# Patient Record
Sex: Male | Born: 1955 | Race: White | Hispanic: No | Smoking: Never smoker
Health system: Southern US, Community
[De-identification: ages and names within clinical notes are randomized; demographics above are authoritative.]

## PROBLEM LIST (undated history)

## (undated) DIAGNOSIS — R32 Unspecified urinary incontinence: Secondary | ICD-10-CM

## (undated) DIAGNOSIS — G4700 Insomnia, unspecified: Secondary | ICD-10-CM

## (undated) DIAGNOSIS — I340 Nonrheumatic mitral (valve) insufficiency: Secondary | ICD-10-CM

## (undated) DIAGNOSIS — M1990 Unspecified osteoarthritis, unspecified site: Secondary | ICD-10-CM

## (undated) DIAGNOSIS — F1020 Alcohol dependence, uncomplicated: Secondary | ICD-10-CM

## (undated) DIAGNOSIS — Z8639 Personal history of other endocrine, nutritional and metabolic disease: Secondary | ICD-10-CM

## (undated) DIAGNOSIS — K862 Cyst of pancreas: Secondary | ICD-10-CM

## (undated) DIAGNOSIS — T7840XA Allergy, unspecified, initial encounter: Secondary | ICD-10-CM

## (undated) DIAGNOSIS — N529 Male erectile dysfunction, unspecified: Secondary | ICD-10-CM

## (undated) DIAGNOSIS — L03119 Cellulitis of unspecified part of limb: Secondary | ICD-10-CM

## (undated) DIAGNOSIS — I272 Pulmonary hypertension, unspecified: Secondary | ICD-10-CM

## (undated) DIAGNOSIS — I10 Essential (primary) hypertension: Secondary | ICD-10-CM

## (undated) DIAGNOSIS — G2581 Restless legs syndrome: Secondary | ICD-10-CM

## (undated) DIAGNOSIS — D649 Anemia, unspecified: Secondary | ICD-10-CM

## (undated) DIAGNOSIS — L02419 Cutaneous abscess of limb, unspecified: Secondary | ICD-10-CM

## (undated) HISTORY — DX: Restless legs syndrome: G25.81

## (undated) HISTORY — DX: Alcohol dependence, uncomplicated: F10.20

## (undated) HISTORY — DX: Cutaneous abscess of limb, unspecified: L02.419

## (undated) HISTORY — DX: Essential (primary) hypertension: I10

## (undated) HISTORY — DX: Pulmonary hypertension, unspecified: I27.20

## (undated) HISTORY — DX: Cellulitis of unspecified part of limb: L03.119

## (undated) HISTORY — PX: HEMORRHOID SURGERY: SHX153

## (undated) HISTORY — DX: Anemia, unspecified: D64.9

## (undated) HISTORY — DX: Insomnia, unspecified: G47.00

## (undated) HISTORY — DX: Unspecified urinary incontinence: R32

## (undated) HISTORY — DX: Cyst of pancreas: K86.2

## (undated) HISTORY — DX: Personal history of other endocrine, nutritional and metabolic disease: Z86.39

## (undated) HISTORY — DX: Male erectile dysfunction, unspecified: N52.9

## (undated) HISTORY — DX: Unspecified osteoarthritis, unspecified site: M19.90

## (undated) HISTORY — DX: Nonrheumatic mitral (valve) insufficiency: I34.0

## (undated) HISTORY — DX: Allergy, unspecified, initial encounter: T78.40XA

---
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
## 2011-10-01 ENCOUNTER — Ambulatory Visit (INDEPENDENT_AMBULATORY_CARE_PROVIDER_SITE_OTHER): Payer: Managed Care, Other (non HMO) | Admitting: Family Medicine

## 2011-10-01 ENCOUNTER — Ambulatory Visit: Payer: Managed Care, Other (non HMO)

## 2011-10-01 VITALS — BP 177/89 | HR 93 | Temp 97.9°F | Resp 16 | Ht 76.18 in | Wt 219.0 lb

## 2011-10-01 DIAGNOSIS — M79609 Pain in unspecified limb: Secondary | ICD-10-CM

## 2011-10-01 DIAGNOSIS — M79605 Pain in left leg: Secondary | ICD-10-CM

## 2011-10-01 DIAGNOSIS — R600 Localized edema: Secondary | ICD-10-CM

## 2011-10-01 DIAGNOSIS — I10 Essential (primary) hypertension: Secondary | ICD-10-CM

## 2011-10-01 DIAGNOSIS — R609 Edema, unspecified: Secondary | ICD-10-CM

## 2011-10-01 LAB — BASIC METABOLIC PANEL
BUN: 13 mg/dL (ref 6–23)
Chloride: 106 mEq/L (ref 96–112)
Glucose, Bld: 107 mg/dL — ABNORMAL HIGH (ref 70–99)
Potassium: 4.2 mEq/L (ref 3.5–5.3)
Sodium: 139 mEq/L (ref 135–145)

## 2011-10-01 LAB — MAGNESIUM: Magnesium: 1.9 mg/dL (ref 1.5–2.5)

## 2011-10-01 LAB — BASIC METABOLIC PANEL WITH GFR
CO2: 22 meq/L (ref 19–32)
Calcium: 9.4 mg/dL (ref 8.4–10.5)
Creat: 0.78 mg/dL (ref 0.50–1.35)

## 2011-10-01 MED ORDER — HYDROCHLOROTHIAZIDE 12.5 MG PO TABS: 12.5000 mg | ORAL_TABLET | Freq: Every day | ORAL | Status: DC
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
## 2011-10-13 ENCOUNTER — Ambulatory Visit (INDEPENDENT_AMBULATORY_CARE_PROVIDER_SITE_OTHER): Payer: Managed Care, Other (non HMO) | Admitting: Family Medicine

## 2011-10-13 ENCOUNTER — Encounter: Payer: Self-pay | Admitting: Family Medicine

## 2011-10-13 VITALS — BP 162/104 | HR 103 | Temp 98.3°F | Resp 16 | Ht 76.0 in | Wt 214.0 lb

## 2011-10-13 DIAGNOSIS — M949 Disorder of cartilage, unspecified: Secondary | ICD-10-CM

## 2011-10-13 DIAGNOSIS — M898X9 Other specified disorders of bone, unspecified site: Secondary | ICD-10-CM

## 2011-10-13 DIAGNOSIS — M1710 Unilateral primary osteoarthritis, unspecified knee: Secondary | ICD-10-CM

## 2011-10-13 DIAGNOSIS — I10 Essential (primary) hypertension: Secondary | ICD-10-CM

## 2011-10-13 DIAGNOSIS — M899 Disorder of bone, unspecified: Secondary | ICD-10-CM

## 2011-10-13 DIAGNOSIS — M159 Polyosteoarthritis, unspecified: Secondary | ICD-10-CM

## 2011-10-13 LAB — HEPATIC FUNCTION PANEL
ALT: 71 U/L — ABNORMAL HIGH (ref 0–53)
AST: 98 U/L — ABNORMAL HIGH (ref 0–37)
Alkaline Phosphatase: 133 U/L — ABNORMAL HIGH (ref 39–117)
Bilirubin, Direct: 0.4 mg/dL — ABNORMAL HIGH (ref 0.0–0.3)
Total Bilirubin: 1.3 mg/dL — ABNORMAL HIGH (ref 0.3–1.2)

## 2011-10-13 LAB — CBC
HCT: 46.1 % (ref 39.0–52.0)
Hemoglobin: 15.1 g/dL (ref 13.0–17.0)
MCH: 32.5 pg (ref 26.0–34.0)
MCHC: 32.8 g/dL (ref 30.0–36.0)
RBC: 4.65 MIL/uL (ref 4.22–5.81)

## 2011-10-13 LAB — POCT SEDIMENTATION RATE: POCT SED RATE: 20 mm/hr (ref 0–22)

## 2011-10-13 MED ORDER — LISINOPRIL 20 MG PO TABS: ORAL_TABLET | ORAL | Status: DC

## 2011-10-13 MED ORDER — NABUMETONE 500 MG PO TABS: 500.0000 mg | ORAL_TABLET | Freq: Two times a day (BID) | ORAL | Status: AC
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
## 2011-10-13 NOTE — Patient Instructions (Signed)
Vitamin D Deficiency  Not having enough vitamin D is called a deficiency. Your body needs this vitamin to keep your bones strong and healthy. Having too little of it can make your bones soft or can cause other health problems.  HOME CARE  Take all vitamins, herbs, or nutrition drinks (supplements) as told by your doctor.   Have your blood tested 2 months after taking vitamins, herbs, or nutrition drinks.   Eat foods that have vitamin D. This includes:   Dairy products, cereals, or juices with added vitamin D. Check the label.   Fatty fish like salmon or trout.   Eggs.   Oysters.   Go outside for 10 to 15 minutes when the sun is shining. Do this 3 times a week. Do not do this if you have skin cancer.   Do not use tanning beds.   Stay at a healthy weight. Lose weight if needed.   Keep all doctor visits as told.  GET HELP IF:  You have questions.   You continue to have problems.   You feel sick to your stomach (nauseous) or throw up (vomit).   You cannot go poop (constipated).   You feel confused.   You have severe belly (abdominal) or back pain.  MAKE SURE YOU:  Understand these instructions.   Will watch your condition.   Will get help right away if you are not doing well or get worse.  Document Released: 07/13/2011 Document Reviewed: 07/11/2011 ExitCare Patient Information 2012 [employer]    .Hypertension As your heart beats, it forces blood through your arteries. This force is your blood pressure. If the pressure is too high, it is called hypertension (HTN) or high blood pressure. HTN is dangerous because you may have it and not know it. High blood pressure may mean that your heart has to work harder to pump blood. Your arteries may be narrow or stiff. The extra work puts you at risk for heart disease, stroke, and other problems.  Blood pressure consists of two numbers, a higher number over a lower, 110/72, for example. It is stated as "110 over 72." The  ideal is below 120 for the top number (systolic) and under 80 for the bottom (diastolic). Write down your blood pressure today. You should pay close attention to your blood pressure if you have certain conditions such as:  Heart failure.   Prior heart attack.   Diabetes   Chronic kidney disease.   Prior stroke.   Multiple risk factors for heart disease.  To see if you have HTN, your blood pressure should be measured while you are seated with your arm held at the level of the heart. It should be measured at least twice. A one-time elevated blood pressure reading (especially in the Emergency Department) does not mean that you need treatment. There may be conditions in which the blood pressure is different between your right and left arms. It is important to see your caregiver soon for a recheck. Most people have essential hypertension which means that there is not a specific cause. This type of high blood pressure may be lowered by changing lifestyle factors such as:  Stress.   Smoking.   Lack of exercise.   Excessive weight.   Drug/tobacco/alcohol use.   Eating less salt.  Most people do not have symptoms from high blood pressure until it has caused damage to the body. Effective treatment can often prevent, delay or reduce that damage. TREATMENT  When a cause has   been identified, treatment for high blood pressure is directed at the cause. There are a large number of medications to treat HTN. These fall into several categories, and your caregiver will help you select the medicines that are best for you. Medications may have side effects. You should review side effects with your caregiver. If your blood pressure stays high after you have made lifestyle changes or started on medicines,   Your medication(s) may need to be changed.   Other problems may need to be addressed.   Be certain you understand your prescriptions, and know how and when to take your medicine.   Be sure to  follow up with your caregiver within the time frame advised (usually within two weeks) to have your blood pressure rechecked and to review your medications.   If you are taking more than one medicine to lower your blood pressure, make sure you know how and at what times they should be taken. Taking two medicines at the same time can result in blood pressure that is too low.  SEEK IMMEDIATE MEDICAL CARE IF:  You develop a severe headache, blurred or changing vision, or confusion.   You have unusual weakness or numbness, or a faint feeling.   You have severe chest or abdominal pain, vomiting, or breathing problems.  MAKE SURE YOU:   Understand these instructions.   Will watch your condition.   Will get help right away if you are not doing well or get worse.  Document Released: 07/24/2005 Document Revised: 07/13/2011 Document Reviewed: 03/13/2008 ExitCare Patient Information 2012 [employer]. 

## 2011-10-14 LAB — VITAMIN D 25 HYDROXY (VIT D DEFICIENCY, FRACTURES): Vit D, 25-Hydroxy: 17 ng/mL — ABNORMAL LOW (ref 30–89)

## 2011-10-15 ENCOUNTER — Encounter: Payer: Self-pay | Admitting: Family Medicine

## 2011-10-15 DIAGNOSIS — I10 Essential (primary) hypertension: Secondary | ICD-10-CM | POA: Insufficient documentation

## 2011-10-15 DIAGNOSIS — M1710 Unilateral primary osteoarthritis, unspecified knee: Secondary | ICD-10-CM | POA: Insufficient documentation

## 2011-10-16 MED ORDER — ERGOCALCIFEROL 1.25 MG (50000 UT) PO CAPS: 50000.0000 [IU] | ORAL_CAPSULE | ORAL | Status: AC

## 2011-10-16 NOTE — Progress Notes (Signed)
Review of labs indicate a moderately severe Vit D deficiency; I routed to pt's pharmacy: Drisdol 50,000 IU  1 capsule once a week for 12 weeks. He should schedule a follow-up with me in about 10 weeks.

## 2011-10-16 NOTE — Progress Notes (Signed)
Quick Note:  Please call pt and advise that the following labs are abnormal... He has a moderately severe Vit D deficiency which could explain a lot of his musculoskeletal symptoms. I have prescribed Drisdol ( Vitamin D) 50,000 IU 4 capsules Take 1 capsule once a week for next 12 weeks (there are RFs on this medication). This med has been routed to his pharmacy.  Other abnl Labs: Elevated liver tests and platelets are a little low ( this is not of huge concern).  I would like for him to schedule a follow-up with me in ~ 10 weeks.    ______

## 2011-10-16 NOTE — Progress Notes (Signed)
Addended by: Dow Adolph B on: 10/16/2011 07:05 PM   Modules accepted: Orders

## 2011-10-23 ENCOUNTER — Encounter: Payer: Self-pay | Admitting: *Deleted

## 2011-10-27 ENCOUNTER — Ambulatory Visit: Payer: Managed Care, Other (non HMO) | Admitting: Family Medicine

## 2011-11-10 ENCOUNTER — Ambulatory Visit: Payer: Managed Care, Other (non HMO) | Admitting: Family Medicine

## 2011-11-24 ENCOUNTER — Ambulatory Visit: Payer: Managed Care, Other (non HMO) | Admitting: Family Medicine

## 2011-11-30 ENCOUNTER — Telehealth: Payer: Self-pay

## 2011-11-30 ENCOUNTER — Ambulatory Visit: Payer: Managed Care, Other (non HMO) | Admitting: Family Medicine

## 2011-11-30 DIAGNOSIS — R600 Localized edema: Secondary | ICD-10-CM

## 2011-11-30 DIAGNOSIS — I10 Essential (primary) hypertension: Secondary | ICD-10-CM

## 2011-11-30 MED ORDER — HYDROCHLOROTHIAZIDE 12.5 MG PO TABS: 12.5000 mg | ORAL_TABLET | Freq: Every day | ORAL | Status: DC

## 2011-11-30 MED ORDER — LISINOPRIL 20 MG PO TABS: ORAL_TABLET | ORAL | Status: DC

## 2011-11-30 NOTE — Telephone Encounter (Signed)
Done   Ronald Zhang

## 2011-11-30 NOTE — Telephone Encounter (Signed)
CAN WE REFILL PT'S BP MED SINCE HIS APPT WAS CANCELLED TODAY?

## 2011-11-30 NOTE — Telephone Encounter (Signed)
Called patient. Left a message about the script being called in.

## 2011-11-30 NOTE — Telephone Encounter (Signed)
Pt had appt w/dr mcpherson today (Dr.McPherson cancelled due to illness) - pt is rescheduled for tomorrow, but is totally out of his bp rx - HCTZ 12.5 Needs refill through the weekend - including today especially   Uses [facility] -- [address]  bf

## 2011-12-01 ENCOUNTER — Telehealth: Payer: Self-pay | Admitting: Internal Medicine

## 2011-12-01 ENCOUNTER — Ambulatory Visit: Payer: Managed Care, Other (non HMO) | Admitting: Family Medicine

## 2011-12-01 DIAGNOSIS — R600 Localized edema: Secondary | ICD-10-CM

## 2011-12-01 DIAGNOSIS — I10 Essential (primary) hypertension: Secondary | ICD-10-CM

## 2011-12-01 MED ORDER — HYDROCHLOROTHIAZIDE 12.5 MG PO TABS: 12.5000 mg | ORAL_TABLET | Freq: Every day | ORAL | Status: DC

## 2011-12-01 NOTE — Telephone Encounter (Signed)
HCTZ refilled for one year

## 2011-12-22 ENCOUNTER — Ambulatory Visit: Payer: Managed Care, Other (non HMO) | Admitting: Family Medicine

## 2012-03-20 ENCOUNTER — Encounter: Payer: Self-pay | Admitting: Family Medicine

## 2012-03-22 ENCOUNTER — Telehealth: Payer: Self-pay | Admitting: Radiology

## 2012-03-22 DIAGNOSIS — I10 Essential (primary) hypertension: Secondary | ICD-10-CM

## 2012-03-22 MED ORDER — LISINOPRIL 20 MG PO TABS: ORAL_TABLET | ORAL | Status: DC

## 2012-03-22 NOTE — Telephone Encounter (Signed)
Refilled Lisinopril. 

## 2012-08-07 DIAGNOSIS — Z8639 Personal history of other endocrine, nutritional and metabolic disease: Secondary | ICD-10-CM

## 2012-08-07 HISTORY — DX: Personal history of other endocrine, nutritional and metabolic disease: Z86.39

## 2012-09-08 ENCOUNTER — Ambulatory Visit (INDEPENDENT_AMBULATORY_CARE_PROVIDER_SITE_OTHER): Payer: Managed Care, Other (non HMO) | Admitting: Family Medicine

## 2012-09-08 VITALS — BP 156/80 | HR 111 | Temp 98.6°F | Resp 16 | Ht 75.0 in | Wt 225.0 lb

## 2012-09-08 DIAGNOSIS — L309 Dermatitis, unspecified: Secondary | ICD-10-CM

## 2012-09-08 DIAGNOSIS — B86 Scabies: Secondary | ICD-10-CM

## 2012-09-08 DIAGNOSIS — F1010 Alcohol abuse, uncomplicated: Secondary | ICD-10-CM

## 2012-09-08 DIAGNOSIS — I10 Essential (primary) hypertension: Secondary | ICD-10-CM

## 2012-09-08 DIAGNOSIS — R600 Localized edema: Secondary | ICD-10-CM

## 2012-09-08 DIAGNOSIS — L259 Unspecified contact dermatitis, unspecified cause: Secondary | ICD-10-CM

## 2012-09-08 DIAGNOSIS — D649 Anemia, unspecified: Secondary | ICD-10-CM

## 2012-09-08 DIAGNOSIS — R609 Edema, unspecified: Secondary | ICD-10-CM

## 2012-09-08 LAB — COMPREHENSIVE METABOLIC PANEL
ALT: 37 U/L (ref 0–53)
Albumin: 4.2 g/dL (ref 3.5–5.2)
CO2: 25 mEq/L (ref 19–32)
Chloride: 101 mEq/L (ref 96–112)
Glucose, Bld: 96 mg/dL (ref 70–99)
Potassium: 3.9 mEq/L (ref 3.5–5.3)
Sodium: 135 mEq/L (ref 135–145)
Total Protein: 8 g/dL (ref 6.0–8.3)

## 2012-09-08 LAB — POCT CBC
HCT, POC: 36.1 % — AB (ref 43.5–53.7)
Hemoglobin: 10.8 g/dL — AB (ref 14.1–18.1)
Lymph, poc: 1.5 (ref 0.6–3.4)
MCH, POC: 26.9 pg — AB (ref 27–31.2)
MCHC: 29.9 g/dL — AB (ref 31.8–35.4)
RBC: 4.01 M/uL — AB (ref 4.69–6.13)
WBC: 7 10*3/uL (ref 4.6–10.2)

## 2012-09-08 MED ORDER — LISINOPRIL-HYDROCHLOROTHIAZIDE 20-25 MG PO TABS: ORAL_TABLET | ORAL | Status: DC

## 2012-09-08 MED ORDER — IVERMECTIN 3 MG PO TABS: 3.0000 mg | ORAL_TABLET | Freq: Once | ORAL | Status: DC

## 2012-09-08 MED ORDER — TRIAMCINOLONE ACETONIDE 0.1 % EX LOTN: TOPICAL_LOTION | CUTANEOUS | Status: DC

## 2012-09-08 NOTE — Progress Notes (Signed)
Subjective: Patient is here for several things. He's been having swelling in his legs, left greater than right, for one and one half years. It's just been a rather subtle onset and persistent problem. Last year he had an ultrasound which negative at [facility]. He is a has been treated for his blood pressure and given a hydrochlorothiazide diuretic with that, but the swelling has continued to persist.  He also has a history of high blood pressure and takes his medication regularly  He has a rash that has been on his trunk and arms for the past couple of months. It has mild itching to it.  He has a little sore place on his nose. He  Objective: Follicular looking placed on the bridge of his nose. He has elevated blood pressure as documented. Chest clear. Heart regular without murmurs. Legs have plus edema in the left, 2+ in the right. Pulse is adequate. No tenderness over the calf veins. Negative Homans sign. He has a papular rash on his trunk and upper arms to just below the elbows. Some areas are excoriated. Outside of this region as well as scattered areas. The waist band does not seem to be particularly involved, though it itches under his underwear line along the abdomen.  Assessment: Dermatitis, possibly scabietic, unclear etiology Bilateral edema, left worse than right, chronic Folliculitis on nose Hypertension poorly controlled  Plan check CBC and C. met and TSH Treat with ivermectin 18 mg(0.2 mg per kilogram) single dose Increase the blood pressure medicines are lisinopril HCT 20/25 for the blood pressure control and increased affect  If  rash persists we will have to do a punch biopsy.  Results for orders placed in visit on 09/08/12  POCT CBC      Component Value Range   WBC 7.0  4.6 - 10.2 K/uL   Lymph, poc 1.5  0.6 - 3.4   POC LYMPH PERCENT 21.2  10 - 50 %L   MID (cbc) 0.7  0 - 0.9   POC MID % 10.0  0 - 12 %M   POC Granulocyte 4.8  2 - 6.9   Granulocyte percent 68.8   37 - 80 %G   RBC 4.01 (*) 4.69 - 6.13 M/uL   Hemoglobin 10.8 (*) 14.1 - 18.1 g/dL   HCT, POC 16.1 (*) 09.6 - 53.7 %   MCV 90.0  80 - 97 fL   MCH, POC 26.9 (*) 27 - 31.2 pg   MCHC 29.9 (*) 31.8 - 35.4 g/dL   RDW, POC 04.5     Platelet Count, POC 161  142 - 424 K/uL   MPV 9.5  0 - 99.8 fL   Needs colonoscopy

## 2012-09-08 NOTE — Patient Instructions (Addendum)
Take the medication for scabies and use but lotion on the rash.  Contact a gastroenterologist to try and schedule a colonoscopy. If you need a referral from [place] please contact us.  I will let you know the results of your labs when they come back. You are anemic, and we will have to decide what treatment would be best for that.  Start working on cutting back on your alcohol intake. I suspect that is harming you.

## 2012-09-09 ENCOUNTER — Encounter: Payer: Self-pay | Admitting: Family Medicine

## 2012-09-09 LAB — TSH: TSH: 2.139 u[IU]/mL (ref 0.350–4.500)

## 2012-09-09 LAB — FOLATE: Folate: 7 ng/mL

## 2012-09-09 LAB — VITAMIN B12: Vitamin B-12: 540 pg/mL (ref 211–911)

## 2012-09-22 ENCOUNTER — Ambulatory Visit (INDEPENDENT_AMBULATORY_CARE_PROVIDER_SITE_OTHER): Payer: Managed Care, Other (non HMO) | Admitting: Family Medicine

## 2012-09-22 VITALS — BP 168/87 | HR 101 | Temp 98.4°F | Resp 20 | Ht 76.0 in | Wt 222.0 lb

## 2012-09-22 DIAGNOSIS — R600 Localized edema: Secondary | ICD-10-CM

## 2012-09-22 DIAGNOSIS — R609 Edema, unspecified: Secondary | ICD-10-CM

## 2012-09-22 DIAGNOSIS — I10 Essential (primary) hypertension: Secondary | ICD-10-CM

## 2012-09-22 DIAGNOSIS — D649 Anemia, unspecified: Secondary | ICD-10-CM

## 2012-09-22 DIAGNOSIS — B86 Scabies: Secondary | ICD-10-CM

## 2012-09-22 MED ORDER — FUROSEMIDE 20 MG PO TABS: ORAL_TABLET | ORAL | Status: DC

## 2012-09-22 MED ORDER — PERMETHRIN 5 % EX CREA: TOPICAL_CREAM | Freq: Once | CUTANEOUS | Status: DC

## 2012-09-22 NOTE — Progress Notes (Signed)
[facility]:  Office Visit  Chief Complaint:  Chief Complaint  Patient presents with  . Follow-up    rash-scabies  . blood pressure    bilateral leg swelling     HPI: Ronald Zhang is a 57 y.o. male who complains of : 1. HTN-new meds, ACEI added but BP still high. Patient drinks 1 pint [place] daily. Still has edema with HCTZ, improved initially but last 2 months worse especially when he has been at work. He states he has not changed anything. US Doppler results from [place] have been requested to be sent to [place].  2. Rash-improved but not completely resolved s/p Ivermectin. He also was using Triamcinolone. 3. Went over labs with patient. LFTs abnormal due to etoh use, T. Bili elevated. He has anemia, would like to go to get colonscopy in July. No stool changes, no hematuria/melena.   Past Medical History  Diagnosis Date  . Hypertension   . Allergy    Past Surgical History  Procedure Laterality Date  . Fracture surgery     History   Social History  . Marital Status: Single    Spouse Name: N/A    Number of Children: N/A  . Years of Education: N/A   Social History Main Topics  . Smoking status: Never Smoker   . Smokeless tobacco: None  . Alcohol Use: Yes  . Drug Use: No  . Sexually Active: None   Other Topics Concern  . None   Social History Narrative  . None   Family History  Problem Relation Age of Onset  . Hypertension Mother   . Hypertension Father    Allergies  Allergen Reactions  . Penicillins Swelling    Childhood   Prior to Admission medications   Medication Sig Start Date End Date Taking? Authorizing Provider  aspirin 81 MG tablet Take 81 mg by mouth daily.   Yes Historical Provider, MD  lisinopril-hydrochlorothiazide (PRINZIDE,ZESTORETIC) 20-25 MG per tablet Take 6 pills single dose for scabies 09/08/12  Yes Peyton Najjar, MD  triamcinolone lotion (KENALOG) 0.1 % Apply twice daily to rash as needed for itching 09/08/12  Yes Peyton Najjar, MD  ergocalciferol (DRISDOL) 50000 UNITS capsule Take 1 capsule (50,000 Units total) by mouth once a week. 10/16/11 10/15/12  Maurice March, MD  ivermectin (STROMECTOL) 3 MG TABS Take 1 tablet (3 mg total) by mouth once. 09/08/12   Peyton Najjar, MD  nabumetone (RELAFEN) 500 MG tablet Take 1 tablet (500 mg total) by mouth 2 (two) times daily. 10/13/11 10/12/12  Maurice March, MD     ROS: The patient denies current  fevers, chills, night sweats, unintentional weight loss, chest pain, palpitations, wheezing, dyspnea on exertion, nausea, vomiting, abdominal pain, dysuria, hematuria, melena, numbness, weakness, or tingling.   All other systems have been reviewed and were otherwise negative with the exception of those mentioned in the HPI and as above.    PHYSICAL EXAM: Filed Vitals:   09/22/12 0932  BP: 168/87  Pulse: 101  Temp: 98.4 F (36.9 C)  Resp: 20   Filed Vitals:   09/22/12 0932  Height: 6\' 4"  (1.93 m)  Weight: 222 lb (100.699 kg)   Body mass index is 27.03 kg/(m^2).  Zhang: Alert, no acute distress HEENT:  Normocephalic, atraumatic, oropharynx patent. EOMI, slight yellow tinge to skin, no scleral icterus.  Cardiovascular:  Regular rate and rhythm, no rubs murmurs or gallops.  No Carotid bruits, radial pulse intact. No pedal  edema.  Respiratory: Clear to auscultation bilaterally.  No wheezes, rales, or rhonchi.  No cyanosis, no use of accessory musculature GI: No organomegaly, abdomen is soft and non-tender, positive bowel sounds.  No masses. Skin: +rashes. + excoriated rash and also red erythematous pruritic lesions on back and arms Neurologic: Facial musculature symmetric. Psychiatric: Patient is appropriate throughout our interaction. Lymphatic: No cervical lymphadenopathy Musculoskeletal: Gait is antalgic, severe valgus   LABS: Results for orders placed in visit on 09/08/12  COMPREHENSIVE METABOLIC PANEL      Result Value Range   Sodium 135  135 - 145  mEq/L   Potassium 3.9  3.5 - 5.3 mEq/L   Chloride 101  96 - 112 mEq/L   CO2 25  19 - 32 mEq/L   Glucose, Bld 96  70 - 99 mg/dL   BUN 17  6 - 23 mg/dL   Creat 1.61  0.96 - 0.45 mg/dL   Total Bilirubin 1.8 (*) 0.3 - 1.2 mg/dL   Alkaline Phosphatase 120 (*) 39 - 117 U/L   AST 88 (*) 0 - 37 U/L   ALT 37  0 - 53 U/L   Total Protein 8.0  6.0 - 8.3 g/dL   Albumin 4.2  3.5 - 5.2 g/dL   Calcium 9.3  8.4 - 40.9 mg/dL  TSH      Result Value Range   TSH 2.139  0.350 - 4.500 uIU/mL  FERRITIN      Result Value Range   Ferritin 37  22 - 322 ng/mL  VITAMIN B12      Result Value Range   Vitamin B-12 540  211 - 911 pg/mL  FOLATE      Result Value Range   Folate 7.0    POCT CBC      Result Value Range   WBC 7.0  4.6 - 10.2 K/uL   Lymph, poc 1.5  0.6 - 3.4   POC LYMPH PERCENT 21.2  10 - 50 %L   MID (cbc) 0.7  0 - 0.9   POC MID % 10.0  0 - 12 %M   POC Granulocyte 4.8  2 - 6.9   Granulocyte percent 68.8  37 - 80 %G   RBC 4.01 (*) 4.69 - 6.13 M/uL   Hemoglobin 10.8 (*) 14.1 - 18.1 g/dL   HCT, POC 81.1 (*) 91.4 - 53.7 %   MCV 90.0  80 - 97 fL   MCH, POC 26.9 (*) 27 - 31.2 pg   MCHC 29.9 (*) 31.8 - 35.4 g/dL   RDW, POC 78.2     Platelet Count, POC 161  142 - 424 K/uL   MPV 9.5  0 - 99.8 fL     EKG/XRAY:   Primary read interpreted by Dr. Conley Rolls at [facility].   ASSESSMENT/PLAN: Encounter Diagnoses  Name Primary?  . HTN (hypertension) Yes  . Bilateral leg edema   . Scabies   . Anemia    D/w patient labs done on 09/08/12.  Return in 2 weeks with BP logs, may need to increase Lisinopril to 40 mg. He  has an alcohol dependency/abuse issue which iscontributing to his HTN. I am not sure increasing his ACEI to 40 mg is going to help. We discussed alcohol and HTN. Patient is not willing/ready to decrease alcohol consumption at this time.  Will add Lasix 10 mg prn for edema, if not better then consider taking 20 mg prn, patient will call me. Hypokalemia risk d/w patient. IF his edema continues  then I  may consider doing an [place] of his legs for venous insufficiency vs possible echocardiogram Rx Permetherin, advise that  if no improvement with rash may use Permetherin. C/w Triamcinolone. Will get punch bx on next visit if no improvement. Advise pt that Permetherin can cause itching after use.  He declines rectal exam today for anemia, will do it in 2 weeks. Would like referral to Dr. Braulio Conte in [place] for colonscopy which he will get in the summer. Has a h/o polyps on colonoscopy 5 years ago. Next visit will get labs: CMP for elevated LFTS, T. Bili ( if elevated may contribute to rash) F/u 2 weeks.    Riki Berninger PHUONG, DO 09/22/2012 11:10 AM

## 2012-09-22 NOTE — Patient Instructions (Signed)

## 2012-09-27 ENCOUNTER — Telehealth: Payer: Self-pay

## 2012-09-27 NOTE — Telephone Encounter (Signed)
PT DOESN'T WANT A CALL BACK BECAUSE HIS RINGER WILL BE OFF, HE WAS TO CHECK IN AND LET DR LE KNOW HOW HE IS DURING AND THE BP MEDS IS WORKING PRETTY GOOD. HIS PRESSURE IS 130/70. BUT HIS LEGS ARE SWELLING AND THEY AREN'T ANY BETTER, BUT HE WILL BE BACK TO SEE HER IN A WEEK.   DIDN'T WANT A CALL BACK JUST FYI

## 2012-10-05 ENCOUNTER — Other Ambulatory Visit: Payer: Self-pay | Admitting: Family Medicine

## 2012-10-05 ENCOUNTER — Ambulatory Visit: Payer: Managed Care, Other (non HMO)

## 2012-10-05 ENCOUNTER — Ambulatory Visit: Payer: Managed Care, Other (non HMO) | Admitting: Family Medicine

## 2012-10-05 VITALS — BP 170/80 | HR 100 | Temp 98.6°F | Resp 18 | Ht 75.0 in | Wt 226.0 lb

## 2012-10-05 DIAGNOSIS — L738 Other specified follicular disorders: Secondary | ICD-10-CM

## 2012-10-05 DIAGNOSIS — R748 Abnormal levels of other serum enzymes: Secondary | ICD-10-CM

## 2012-10-05 DIAGNOSIS — R079 Chest pain, unspecified: Secondary | ICD-10-CM

## 2012-10-05 DIAGNOSIS — E785 Hyperlipidemia, unspecified: Secondary | ICD-10-CM

## 2012-10-05 DIAGNOSIS — R600 Localized edema: Secondary | ICD-10-CM

## 2012-10-05 DIAGNOSIS — L739 Follicular disorder, unspecified: Secondary | ICD-10-CM

## 2012-10-05 DIAGNOSIS — R609 Edema, unspecified: Secondary | ICD-10-CM

## 2012-10-05 DIAGNOSIS — D649 Anemia, unspecified: Secondary | ICD-10-CM

## 2012-10-05 DIAGNOSIS — L678 Other hair color and hair shaft abnormalities: Secondary | ICD-10-CM

## 2012-10-05 DIAGNOSIS — R21 Rash and other nonspecific skin eruption: Secondary | ICD-10-CM

## 2012-10-05 LAB — COMPREHENSIVE METABOLIC PANEL WITH GFR
AST: 107 U/L — ABNORMAL HIGH (ref 0–37)
Alkaline Phosphatase: 114 U/L (ref 39–117)
Glucose, Bld: 103 mg/dL — ABNORMAL HIGH (ref 70–99)
Sodium: 140 meq/L (ref 135–145)
Total Bilirubin: 1.7 mg/dL — ABNORMAL HIGH (ref 0.3–1.2)
Total Protein: 7.8 g/dL (ref 6.0–8.3)

## 2012-10-05 LAB — COMPREHENSIVE METABOLIC PANEL
ALT: 41 U/L (ref 0–53)
Albumin: 3.9 g/dL (ref 3.5–5.2)
BUN: 17 mg/dL (ref 6–23)
CO2: 23 mEq/L (ref 19–32)
Calcium: 9.2 mg/dL (ref 8.4–10.5)
Chloride: 105 mEq/L (ref 96–112)
Creat: 0.88 mg/dL (ref 0.50–1.35)
Potassium: 4 mEq/L (ref 3.5–5.3)

## 2012-10-05 LAB — POCT CBC
Granulocyte percent: 68.3 %G (ref 37–80)
HCT, POC: 34 % — AB (ref 43.5–53.7)
Hemoglobin: 10.4 g/dL — AB (ref 14.1–18.1)
Lymph, poc: 1.4 (ref 0.6–3.4)
MCH, POC: 26.3 pg — AB (ref 27–31.2)
MCHC: 30.6 g/dL — AB (ref 31.8–35.4)
MCV: 86.1 fL (ref 80–97)
MID (cbc): 0.6 (ref 0–0.9)
MPV: 8.9 fL (ref 0–99.8)
POC Granulocyte: 4.3 (ref 2–6.9)
POC LYMPH PERCENT: 22.5 %L (ref 10–50)
POC MID %: 9.2 % (ref 0–12)
Platelet Count, POC: 147 10*3/uL (ref 142–424)
RBC: 3.95 M/uL — AB (ref 4.69–6.13)
RDW, POC: 16.9 %
WBC: 6.3 10*3/uL (ref 4.6–10.2)

## 2012-10-05 LAB — POCT URINALYSIS DIPSTICK
Bilirubin, UA: NEGATIVE
Blood, UA: NEGATIVE
Glucose, UA: NEGATIVE
Leukocytes, UA: NEGATIVE
Nitrite, UA: NEGATIVE
Spec Grav, UA: 1.02
Urobilinogen, UA: >=8
pH, UA: 7.5

## 2012-10-05 LAB — LIPID PANEL
Cholesterol: 189 mg/dL (ref 0–200)
HDL: 31 mg/dL — ABNORMAL LOW (ref >39)
LDL Cholesterol: 139 mg/dL — ABNORMAL HIGH (ref 0–99)
Total CHOL/HDL Ratio: 6.1 ratio
Triglycerides: 94 mg/dL (ref <150)
VLDL: 19 mg/dL (ref 0–40)

## 2012-10-05 LAB — POCT UA - MICROSCOPIC ONLY
Bacteria, U Microscopic: NEGATIVE
Casts, Ur, LPF, POC: NEGATIVE
Crystals, Ur, HPF, POC: NEGATIVE
Mucus, UA: NEGATIVE
RBC, urine, microscopic: NEGATIVE
Yeast, UA: NEGATIVE

## 2012-10-05 LAB — IFOBT (OCCULT BLOOD): IFOBT: NEGATIVE

## 2012-10-05 LAB — POCT GLYCOSYLATED HEMOGLOBIN (HGB A1C): Hemoglobin A1C: 5.3

## 2012-10-05 MED ORDER — CLOBETASOL PROPIONATE 0.05 % EX CREA: TOPICAL_CREAM | Freq: Two times a day (BID) | CUTANEOUS | Status: DC

## 2012-10-05 MED ORDER — MUPIROCIN 2 % EX OINT: TOPICAL_OINTMENT | Freq: Three times a day (TID) | CUTANEOUS | Status: DC

## 2012-10-05 MED ORDER — HYDROXYZINE HCL 10 MG PO TABS: 10.0000 mg | ORAL_TABLET | Freq: Every day | ORAL | Status: DC | PRN

## 2012-10-05 NOTE — Patient Instructions (Signed)

## 2012-10-05 NOTE — Progress Notes (Signed)
[facility]:  Office Visit  Chief Complaint:  Chief Complaint  Patient presents with  . Edema    in both legs, recheck   . Hypertension    started medication, recheck BP  . Rash    improving with medication, recheck     HPI: Ronald Zhang is a 57 y.o. male who complains of : 1. HTN- has been good, 130/70s at home. Denies dizziness. He has had CP migrating from different areas, left to right, last from 2-5 seconds, happens with and without exertion, sharp pain, denies HA, vision changes, diaphoreisis, numbness tingling, jaw or shoulde rpain, or n/v/abd pain. This has been going for 1 week. ? XOL, ? Diabetes 2. Pedal edema no improvement-took Lasix 10 mg prior to going to work. Worse today than it has ever been 3. Rash is better, less scratching but not completely resolved 5. Anemia-has colonoscopy scheduled with Dr. Letta Moynahan. He is ok to get a rectal exam today.     Past Medical History  Diagnosis Date  . Hypertension   . Allergy    Past Surgical History  Procedure Laterality Date  . Fracture surgery     History   Social History  . Marital Status: Single    Spouse Name: N/A    Number of Children: N/A  . Years of Education: N/A   Social History Main Topics  . Smoking status: Never Smoker   . Smokeless tobacco: None  . Alcohol Use: Yes  . Drug Use: No  . Sexually Active: None   Other Topics Concern  . None   Social History Narrative  . None   Family History  Problem Relation Age of Onset  . Hypertension Mother   . Hypertension Father    Allergies  Allergen Reactions  . Penicillins Swelling    Childhood   Prior to Admission medications   Medication Sig Start Date End Date Taking? Authorizing Provider  aspirin 81 MG tablet Take 81 mg by mouth daily.   Yes Historical Provider, MD  furosemide (LASIX) 20 MG tablet Take 1/2 tab PO prn for swelling. Eat a banana with this. 09/22/12  Yes Tniya Bowditch P Anijah Spohr, DO  lisinopril-hydrochlorothiazide  (PRINZIDE,ZESTORETIC) 20-25 MG per tablet Take 6 pills single dose for scabies 09/08/12  Yes Peyton Najjar, MD  ergocalciferol (DRISDOL) 50000 UNITS capsule Take 1 capsule (50,000 Units total) by mouth once a week. 10/16/11 10/15/12  Maurice March, MD  ivermectin (STROMECTOL) 3 MG TABS Take 1 tablet (3 mg total) by mouth once. 09/08/12   Peyton Najjar, MD  nabumetone (RELAFEN) 500 MG tablet Take 1 tablet (500 mg total) by mouth 2 (two) times daily. 10/13/11 10/12/12  Maurice March, MD  permethrin (ELIMITE) 5 % cream Apply topically once. 09/22/12   Chevette Fee P Sherae Santino, DO  triamcinolone lotion (KENALOG) 0.1 % Apply twice daily to rash as needed for itching 09/08/12   Peyton Najjar, MD     ROS: The patient denies fevers, chills, night sweats, unintentional weight loss, palpitations, wheezing, dyspnea on exertion, nausea, vomiting, abdominal pain, dysuria, hematuria, melena, numbness, weakness, or tingling.   All other systems have been reviewed and were otherwise negative with the exception of those mentioned in the HPI and as above.    PHYSICAL EXAM: Filed Vitals:   10/05/12 0903  BP: 170/80  Pulse: 100  Temp: 98.6 F (37 C)  Resp: 18   Filed Vitals:   10/05/12 0903  Height: 6\' 3"  (1.905 m)  Weight: 226 lb (102.513 kg)   Body mass index is 28.25 kg/(m^2).  General: Alert, no acute distress HEENT:  Normocephalic, atraumatic, oropharynx patent. EOMI, PERRLA, fundoscopic exam nl Cardiovascular:  Regular rate and rhythm, no rubs murmurs or gallops.  No Carotid bruits, radial pulse intact. +1-2 pedal edema BL.  Respiratory: Clear to auscultation bilaterally.  No wheezes, rales, or rhonchi.  No cyanosis, no use of accessory musculature GI: No organomegaly, abdomen is soft and non-tender, positive bowel sounds.  No masses. Skin: No rashes. Neurologic: Facial musculature symmetric. Psychiatric: Patient is appropriate throughout our interaction. Lymphatic: No cervical  lymphadenopathy Musculoskeletal: Gait algus due to chronic knee issues.   LABS: Results for orders placed in visit on 10/05/12  IFOBT (OCCULT BLOOD)      Result Value Range   IFOBT Negative    POCT GLYCOSYLATED HEMOGLOBIN (HGB A1C)      Result Value Range   Hemoglobin A1C 5.3    POCT CBC      Result Value Range   WBC 6.3  4.6 - 10.2 K/uL   Lymph, poc 1.4  0.6 - 3.4   POC LYMPH PERCENT 22.5  10 - 50 %L   MID (cbc) 0.6  0 - 0.9   POC MID % 9.2  0 - 12 %M   POC Granulocyte 4.3  2 - 6.9   Granulocyte percent 68.3  37 - 80 %G   RBC 3.95 (*) 4.69 - 6.13 M/uL   Hemoglobin 10.4 (*) 14.1 - 18.1 g/dL   HCT, POC 09.8 (*) 11.9 - 53.7 %   MCV 86.1  80 - 97 fL   MCH, POC 26.3 (*) 27 - 31.2 pg   MCHC 30.6 (*) 31.8 - 35.4 g/dL   RDW, POC 14.7     Platelet Count, POC 147  142 - 424 K/uL   MPV 8.9  0 - 99.8 fL  POCT UA - MICROSCOPIC ONLY      Result Value Range   WBC, Ur, HPF, POC 0-1     RBC, urine, microscopic neg     Bacteria, U Microscopic neg     Mucus, UA neg     Epithelial cells, urine per micros 0-3     Crystals, Ur, HPF, POC neg     Casts, Ur, LPF, POC neg     Yeast, UA neg    POCT URINALYSIS DIPSTICK      Result Value Range   Color, UA yellow     Clarity, UA cloudy     Glucose, UA neg     Bilirubin, UA neg     Ketones, UA trace     Spec Grav, UA 1.020     Blood, UA neg     pH, UA 7.5     Protein, UA trace     Urobilinogen, UA >=8.0     Nitrite, UA neg     Leukocytes, UA Negative       EKG/XRAY:   Primary read interpreted by Dr. Conley Rolls at [facility]. CXR shows no pneumo, no infiltrates, + increase vascular markings EKG sinus tach 106, no ST elevtion, depression, PACs   ASSESSMENT/PLAN: Encounter Diagnoses  Name Primary?  Marland Kitchen Anemia Yes  . Chest pain   . Other and unspecified hyperlipidemia   . Pedal edema   . Folliculitis   . Rash and nonspecific skin eruption     1. Stool negative, getting colonoscopy sometime this summer 2. Refer to cardiology in [place] 3.  Checking bilirubin, checking  kidney fxn, and liver with labs 4. Rash-eczema, moisturize and rx clobetasol, hydroxyzine 5. Bacroban for folliculitis onleft buttcheek and also burn 6. Compression stockings for now-will determine if he should take 20 mg Lasix pending CMP 7. Repeat CMP for abnormal LFTs for the last 12 months. I will go ahead and get an US abdomen  and also check for Hep B and C and also GTT if liver enzymes are above normal on this visit.  He does drink bourbon daily. Ferritin level were normal in 09/2012. Lipid panel pending.  Go to ER prn for worsening CP/SOB   Lenzi Marmo PHUONG, DO 10/05/2012 11:42 AM

## 2012-10-09 ENCOUNTER — Telehealth: Payer: Self-pay | Admitting: Family Medicine

## 2012-10-09 LAB — HEPATITIS B SURFACE ANTIGEN: Hepatitis B Surface Ag: NEGATIVE

## 2012-10-09 LAB — HEPATITIS C ANTIBODY: HCV Ab: NEGATIVE

## 2012-10-09 LAB — GAMMA GT: GGT: 173 U/L — ABNORMAL HIGH (ref 7–51)

## 2012-10-09 NOTE — Telephone Encounter (Signed)
LM regarding labs results and need for [place] of abdomen: LFTs still elevated x 1 year. He drinks daily. I want to make sure he does not have Hepatitis so I added those labs to prior blood draw. I have ordered a [place] of abdomen.  Lipid panel slightly elevated, he needs to go see cardiology for CP, pedal edema workup before I want to start him on any cholesterol medication. Advise to call us back if he does not hear from our office in 2-3 days.  Potassium ok so he can try the full dose of Lasxi 20 mg daily before he goes to work to see if it helps with his swelling.  F/u in 1 month with BP logs, HR logs.

## 2012-10-10 LAB — HEPATITIS B SURFACE ANTIBODY,QUALITATIVE: Hep B S Ab: REACTIVE — AB

## 2012-10-14 ENCOUNTER — Telehealth: Payer: Self-pay | Admitting: Family Medicine

## 2012-10-14 ENCOUNTER — Encounter: Payer: Self-pay | Admitting: Family Medicine

## 2012-10-14 NOTE — Telephone Encounter (Signed)
Attempted to call with lab results and to le thim know when to f.u but he never picks up phone since he works 3rd shift and is sleeping during the day. I wrote him a message, part of it is handwritten but essentially went over his labs with him . Would like to see him again in 2-3 weeks. By then he should have a cardiology appt and also [place] of abdomen done. We will have more info. I would like to see him back in 2-4 weeks.

## 2012-10-28 ENCOUNTER — Other Ambulatory Visit: Payer: Self-pay

## 2012-11-22 ENCOUNTER — Ambulatory Visit (INDEPENDENT_AMBULATORY_CARE_PROVIDER_SITE_OTHER): Payer: Managed Care, Other (non HMO) | Admitting: Family Medicine

## 2012-11-22 VITALS — BP 120/64 | HR 78 | Temp 98.1°F | Resp 18 | Ht 74.0 in | Wt 209.0 lb

## 2012-11-22 DIAGNOSIS — R600 Localized edema: Secondary | ICD-10-CM

## 2012-11-22 DIAGNOSIS — IMO0001 Reserved for inherently not codable concepts without codable children: Secondary | ICD-10-CM

## 2012-11-22 DIAGNOSIS — I10 Essential (primary) hypertension: Secondary | ICD-10-CM

## 2012-11-22 DIAGNOSIS — R609 Edema, unspecified: Secondary | ICD-10-CM

## 2012-11-22 DIAGNOSIS — L97909 Non-pressure chronic ulcer of unspecified part of unspecified lower leg with unspecified severity: Secondary | ICD-10-CM

## 2012-11-22 DIAGNOSIS — Z09 Encounter for follow-up examination after completed treatment for conditions other than malignant neoplasm: Secondary | ICD-10-CM

## 2012-11-22 DIAGNOSIS — I83009 Varicose veins of unspecified lower extremity with ulcer of unspecified site: Secondary | ICD-10-CM

## 2012-11-22 LAB — POCT CBC
Granulocyte percent: 64.6 % (ref 37–80)
HCT, POC: 32.7 % — AB (ref 43.5–53.7)
Hemoglobin: 9.8 g/dL — AB (ref 14.1–18.1)
Lymph, poc: 2.2 (ref 0.6–3.4)
MCH, POC: 24.8 pg — AB (ref 27–31.2)
MCHC: 30 g/dL — AB (ref 31.8–35.4)
MCV: 82.8 fL (ref 80–97)
MID (cbc): 0.9 (ref 0–0.9)
MPV: 10.9 fL (ref 0–99.8)
POC Granulocyte: 5.6 (ref 2–6.9)
POC LYMPH PERCENT: 25.2 % (ref 10–50)
POC MID %: 10.2 %M (ref 0–12)
Platelet Count, POC: 194 10*3/uL (ref 142–424)
RBC: 3.95 M/uL — AB (ref 4.69–6.13)
RDW, POC: 19 %
WBC: 8.6 10*3/uL (ref 4.6–10.2)

## 2012-11-22 NOTE — Progress Notes (Signed)
[facility]:  Office Visit  Chief Complaint:  Chief Complaint  Patient presents with  . Follow-up    wants picc line d/c  . Edema    lower ext. bilat    HPI: Ronald Zhang is a 57 y.o. male who complains of  Here for hospital follow-up from Ronald Zhang. He was discharged  on 11/14/12 for worsening bilateral edema with cellulitis, put on vancomycin IV x a total of 10 days and now skin and edema are both better. He was discharged with Lasix 80 mg PO TID but has only been taking his HTN med and the Lasix 80 mg once daily. He has stoppeddrinking, prior to admission, he was on a pint of hard liquor daily.  Deneis feves, chills. Has stopped drinking since in hospital, denies any withdrawal sxs. Denies dizziness.   Past Medical History  Diagnosis Date  . Hypertension   . Allergy    Past Surgical History  Procedure Laterality Date  . Fracture surgery     History   Social History  . Marital Status: Single    Spouse Name: N/A    Number of Children: N/A  . Years of Education: N/A   Social History Main Topics  . Smoking status: Never Smoker   . Smokeless tobacco: None  . Alcohol Use: Yes  . Drug Use: No  . Sexually Active: None   Other Topics Concern  . None   Social History Narrative  . None   Family History  Problem Relation Age of Onset  . Hypertension Mother   . Hypertension Father    Allergies  Allergen Reactions  . Penicillins Swelling    Childhood   Prior to Admission medications   Medication Sig Start Date End Date Taking? Authorizing Provider  aspirin 81 MG tablet Take 81 mg by mouth daily.   Yes Historical Provider, MD  clobetasol cream (TEMOVATE) 0.05 % Apply topically 2 (two) times daily. 10/05/12  Yes Ronald Zhang P Donie Moulton, DO  furosemide (LASIX) 20 MG tablet Take 1/2 tab PO prn for swelling. Eat a banana with this. 09/22/12  Yes Ronald Osborn P Homar Weinkauf, DO  hydrOXYzine (ATARAX/VISTARIL) 10 MG tablet Take 1 tablet (10 mg total) by mouth daily as needed for itching.  10/05/12  Yes Ronald Mcclatchy P Eshal Propps, DO  lisinopril-hydrochlorothiazide (PRINZIDE,ZESTORETIC) 20-25 MG per tablet Take 6 pills single dose for scabies 09/08/12  Yes Ronald Najjar, MD  mupirocin ointment (BACTROBAN) 2 % Apply topically 3 (three) times daily. 10/05/12  Yes Ronald Powley P Raschelle Wisenbaker, DO     ROS: The patient denies fevers, chills, night sweats, unintentional weight loss, chest pain, palpitations, wheezing, dyspnea on exertion, nausea, vomiting, abdominal pain, dysuria, hematuria, melena, numbness, weakness, or tingling.   All other systems have been reviewed and were otherwise negative with the exception of those mentioned in the HPI and as above.    PHYSICAL EXAM: Filed Vitals:   11/22/12 1511  BP: 120/64  Pulse: 78  Temp: 98.1 F (36.7 C)  Resp: 18   Filed Vitals:   11/22/12 1511  Height: 6\' 2"  (1.88 m)  Weight: 209 lb (94.802 kg)   Body mass index is 26.82 kg/(m^2).  General: Alert, no acute distress HEENT:  Normocephalic, atraumatic, oropharynx patent.  Cardiovascular:  Regular rate and rhythm, no rubs murmurs or gallops.  No Carotid bruits, radial pulse intact. No pedal edema.  Respiratory: Clear to auscultation bilaterally.  No wheezes, rales, or rhonchi.  No cyanosis, no use of accessory musculature GI:  +  hepatomegaly, abdomen is soft and non-tender, positive bowel sounds.  No masses. Skin: + dry skin, + dry ulcers on bilateral Lower extremity Neurologic: Facial musculature symmetric. Psychiatric: Patient is appropriate throughout our interaction. Lymphatic: No cervical lymphadenopathy Musculoskeletal: Gait intact.   LABS: Results for orders placed in visit on 11/22/12  POCT CBC      Result Value Range   WBC 8.6  4.6 - 10.2 K/uL   Lymph, poc 2.2  0.6 - 3.4   POC LYMPH PERCENT 25.2  10 - 50 %L   MID (cbc) 0.9  0 - 0.9   POC MID % 10.2  0 - 12 %M   POC Granulocyte 5.6  2 - 6.9   Granulocyte percent 64.6  37 - 80 %G   RBC 3.95 (*) 4.69 - 6.13 M/uL   Hemoglobin 9.8 (*) 14.1 - 18.1  g/dL   HCT, POC 16.1 (*) 09.6 - 53.7 %   MCV 82.8  80 - 97 fL   MCH, POC 24.8 (*) 27 - 31.2 pg   MCHC 30.0 (*) 31.8 - 35.4 g/dL   RDW, POC 04.5     Platelet Count, POC 194  142 - 424 K/uL   MPV 10.9  0 - 99.8 fL     EKG/XRAY:   Primary read interpreted by Ronald Zhang at [facility].   ASSESSMENT/PLAN: Encounter Diagnoses  Name Primary?  Marland Kitchen Hospital discharge follow-up Yes  . Bilateral lower extremity edema   . Venous stasis ulcer, left   . Ulcer, venous stasis, right   . HTN (hypertension)    Ronald Zhang is well known to me. He was recently dc from Ronald Zhang for cellulitis and edema of bilateral Ronald Zhang.  He as on IV vanc and also on PO lasix He has completed his Vancomycin regimen, needs PICC lin removed. I have called Ronald Zhang who is in charge of PICC line removal scheduling with Ronald Zhang. LM for her to get PICC line out, left my cell #.  They are not open during Ronald Zhang.  Specialty Procedures, schedules PICC line removal [phone].  Continue with Lasix 80 mg 1/2 tab daily, discontinue 1 tab daily.  F/u in 1 week for wound care I will f/u with Ronald Zhang for echo results He needs to get an [place] of his liver since liver enzymes have been elevated for a long time now.  We spent over 45 min counseling, debriding and getting and reviewing hospital records during this Ronald Lou, DO 11/22/2012 5:48 PM

## 2012-11-23 LAB — COMPREHENSIVE METABOLIC PANEL
CO2: 22 mEq/L (ref 19–32)
Calcium: 9.1 mg/dL (ref 8.4–10.5)
Chloride: 102 mEq/L (ref 96–112)
Creat: 1.76 mg/dL — ABNORMAL HIGH (ref 0.50–1.35)
Glucose, Bld: 102 mg/dL — ABNORMAL HIGH (ref 70–99)
Total Bilirubin: 1.2 mg/dL (ref 0.3–1.2)
Total Protein: 8.3 g/dL (ref 6.0–8.3)

## 2012-11-23 LAB — COMPREHENSIVE METABOLIC PANEL WITH GFR
ALT: 24 U/L (ref 0–53)
AST: 42 U/L — ABNORMAL HIGH (ref 0–37)
Albumin: 3.5 g/dL (ref 3.5–5.2)
Alkaline Phosphatase: 108 U/L (ref 39–117)
BUN: 34 mg/dL — ABNORMAL HIGH (ref 6–23)
Potassium: 4.8 meq/L (ref 3.5–5.3)
Sodium: 135 meq/L (ref 135–145)

## 2012-11-25 ENCOUNTER — Encounter: Payer: Self-pay | Admitting: Family Medicine

## 2012-11-25 ENCOUNTER — Telehealth: Payer: Self-pay | Admitting: Family Medicine

## 2012-11-25 NOTE — Telephone Encounter (Signed)
Will call later, attempted to leave message with mom but she did not seem to understand.

## 2012-11-26 ENCOUNTER — Telehealth: Payer: Self-pay | Admitting: Family Medicine

## 2012-11-26 NOTE — Telephone Encounter (Signed)
LM with mom for him to call me about his CMP. Kidney fucntion is poor due to porbably lasix that he had in hospital for pedal edema. Needs to stopl Lasix 80 mg 1/2 tab daily. He can continue with his e HTn med but that is it. If his legs swell up again then he needs to call us.  He is to f/u i 1 week anyhow nad we will re-evaluated legs and kidney fucntion.

## 2012-11-27 ENCOUNTER — Telehealth: Payer: Self-pay

## 2012-11-27 NOTE — Telephone Encounter (Signed)
Pt is calling because he is on short term disability and is suppose to be returning to work soon and has to see Dr. Conley Rolls. He was wanting Dr. Conley Rolls to know that his case manager is going to be trying to get in touch with her with some papers And those papers need to be filled out and sent as soon as possible. Call back number is [phone]

## 2012-11-27 NOTE — Telephone Encounter (Signed)
Spoke to patient and he gave his case worker the fax number to our office- they will be sending. He is waiting for the [place] of his liver to be completed and he will be back in on Monday or Tuesday to see Dr. Conley Rolls

## 2012-11-29 ENCOUNTER — Ambulatory Visit
Admission: RE | Admit: 2012-11-29 | Discharge: 2012-11-29 | Disposition: A | Discharge status: Home / Self Care | Payer: Managed Care, Other (non HMO) | Source: Ambulatory Visit | Attending: Family Medicine | Admitting: Family Medicine

## 2012-11-29 DIAGNOSIS — R748 Abnormal levels of other serum enzymes: Secondary | ICD-10-CM

## 2012-12-02 ENCOUNTER — Ambulatory Visit (INDEPENDENT_AMBULATORY_CARE_PROVIDER_SITE_OTHER): Payer: Managed Care, Other (non HMO) | Admitting: Family Medicine

## 2012-12-02 VITALS — BP 120/66 | HR 70 | Temp 97.7°F | Resp 16 | Wt 201.0 lb

## 2012-12-02 DIAGNOSIS — L0291 Cutaneous abscess, unspecified: Secondary | ICD-10-CM

## 2012-12-02 DIAGNOSIS — R9389 Abnormal findings on diagnostic imaging of other specified body structures: Secondary | ICD-10-CM

## 2012-12-02 DIAGNOSIS — R21 Rash and other nonspecific skin eruption: Secondary | ICD-10-CM

## 2012-12-02 DIAGNOSIS — L0390 Cellulitis, unspecified: Secondary | ICD-10-CM

## 2012-12-02 DIAGNOSIS — N289 Disorder of kidney and ureter, unspecified: Secondary | ICD-10-CM

## 2012-12-02 MED ORDER — HYDROXYZINE HCL 10 MG PO TABS: 10.0000 mg | ORAL_TABLET | Freq: Every day | ORAL | Status: DC | PRN

## 2012-12-02 NOTE — Progress Notes (Signed)
 [facility]:  Office Visit  Chief Complaint:  Chief Complaint  Patient presents with  . Cellulitis  . Follow-up    u/s    HPI: Ronald Zhang is a 57 y.o. male who complains of  Here for follow-up. 1. Lower extremity edema-when dc from hospital he was 209, today 201. Cellulitis resolved. Edema is resolved s/p IV vanocmycin. Skin changes and wounds look better. Patient has been cleaning and dressing them daily.  2. Elevated creatinine-due to Lasix in hospital for bilateral pedal edema? Here for recheck.    Went over echocardiogram and also ultrasound results. Will get MRI when patient is able to pay copay for MRI He has been on short term disabilty April 5th-May 5th.    Past Medical History  Diagnosis Date  . Hypertension   . Allergy   . Pulmonary HTN     TEE on March 31,2014-mild-mod concentric hypertrophy.  EF 60-65%. ft atrial valve mildly dilated, right atrium mildly dilated, mild-moderate MR, mild TR. Pum Pressure 51 mm Hg  . Alcohol dependency     stopped drinking since November 09, 2012  . Cellulitis and abscess of leg     Hospitalized at [facility] from 4/5-4/10/14 for bilateral  edema and cellulitis. Was on IV Lasix and Vancomycin.    Past Surgical History  Procedure Laterality Date  . Fracture surgery     History   Social History  . Marital Status: Single    Spouse Name: N/A    Number of Children: N/A  . Years of Education: N/A   Social History Main Topics  . Smoking status: Never Smoker   . Smokeless tobacco: None  . Alcohol Use: No  . Drug Use: No  . Sexually Active: None   Other Topics Concern  . None   Social History Narrative  . None   Family History  Problem Relation Age of Onset  . Hypertension Mother   . Hypertension Father    Allergies  Allergen Reactions  . Penicillins Swelling    Childhood   Prior to Admission medications   Medication Sig Start Date End Date Taking? Authorizing Provider  aspirin 81 MG  tablet Take 81 mg by mouth daily.   Yes Historical Provider, MD  lisinopril-hydrochlorothiazide (PRINZIDE,ZESTORETIC) 20-25 MG per tablet Take 6 pills single dose for scabies 09/08/12  Yes Peyton Najjar, MD  clobetasol cream (TEMOVATE) 0.05 % Apply topically 2 (two) times daily. 10/05/12    P , DO  furosemide (LASIX) 20 MG tablet Take 1/2 tab PO prn for swelling. Eat a banana with this. 09/22/12    P , DO  hydrOXYzine (ATARAX/VISTARIL) 10 MG tablet Take 1 tablet (10 mg total) by mouth daily as needed for itching. 10/05/12    P , DO  mupirocin ointment (BACTROBAN) 2 % Apply topically 3 (three) times daily. 10/05/12    P , DO     ROS: The patient denies fevers, chills, night sweats, unintentional weight loss, chest pain, palpitations, wheezing, dyspnea on exertion, nausea, vomiting, abdominal pain, dysuria, hematuria, melena, numbness, weakness, or tingling.   All other systems have been reviewed and were otherwise negative with the exception of those mentioned in the HPI and as above.    PHYSICAL EXAM: Filed Vitals:   12/02/12 1527  BP: 120/66  Pulse: 70  Temp: 97.7 F (36.5 C)  Resp: 16   Filed Vitals:   12/02/12 1527  Weight: 201 lb (91.173 kg)   Body mass index is  25.8 kg/(m^2).  General: Alert, no acute distress HEENT:  Normocephalic, atraumatic, oropharynx patent.  Cardiovascular:  Regular rate and rhythm, no rubs murmurs or gallops.  No Carotid bruits, radial pulse intact. No pedal edema.  Respiratory: Clear to auscultation bilaterally.  No wheezes, rales, or rhonchi.  No cyanosis, no use of accessory musculature GI: No organomegaly, abdomen is soft and non-tender, positive bowel sounds.  + hepatomeagaly Skin: No rashes. Neurologic: Facial musculature symmetric. Psychiatric: Patient is appropriate throughout our interaction. Lymphatic: No cervical lymphadenopathy Musculoskeletal: Gait intact.  edema resolves. Ulcers and wounds on feet look dry and scabbed  over. Healing well.    LABS: Results for orders placed in visit on 11/22/12  COMPREHENSIVE METABOLIC PANEL      Result Value Range   Sodium 135  135 - 145 mEq/L   Potassium 4.8  3.5 - 5.3 mEq/L   Chloride 102  96 - 112 mEq/L   CO2 22  19 - 32 mEq/L   Glucose, Bld 102 (*) 70 - 99 mg/dL   BUN 34 (*) 6 - 23 mg/dL   Creat 1.47 (*) 8.29 - 1.35 mg/dL   Total Bilirubin 1.2  0.3 - 1.2 mg/dL   Alkaline Phosphatase 108  39 - 117 U/L   AST 42 (*) 0 - 37 U/L   ALT 24  0 - 53 U/L   Total Protein 8.3  6.0 - 8.3 g/dL   Albumin 3.5  3.5 - 5.2 g/dL   Calcium 9.1  8.4 - 56.2 mg/dL  POCT CBC      Result Value Range   WBC 8.6  4.6 - 10.2 K/uL   Lymph, poc 2.2  0.6 - 3.4   POC LYMPH PERCENT 25.2  10 - 50 %L   MID (cbc) 0.9  0 - 0.9   POC MID % 10.2  0 - 12 %M   POC Granulocyte 5.6  2 - 6.9   Granulocyte percent 64.6  37 - 80 %G   RBC 3.95 (*) 4.69 - 6.13 M/uL   Hemoglobin 9.8 (*) 14.1 - 18.1 g/dL   HCT, POC 13.0 (*) 86.5 - 53.7 %   MCV 82.8  80 - 97 fL   MCH, POC 24.8 (*) 27 - 31.2 pg   MCHC 30.0 (*) 31.8 - 35.4 g/dL   RDW, POC 78.4     Platelet Count, POC 194  142 - 424 K/uL   MPV 10.9  0 - 99.8 fL     EKG/XRAY:   Primary read interpreted by Dr. Conley Rolls at [facility].   ASSESSMENT/PLAN: Encounter Diagnoses  Name Primary?  . Rash and nonspecific skin eruption Yes  . Abnormal ultrasound   . Abnormal kidney function   . Cellulitis    Refilled hydroxyzine. I hope he is not getting a rash and itchiness related to his decreased kidney function.  Will await Creatinine to see if has gone down to normal since stopped Lasix for pedal edema. If able he can get MRI of abd with contrast. If we do that I will need to rx him mucomyst for renal protection. He will try to get MRI when he gets his finances in order. I have stressed the importance of getting scan based on [place] reports.  Lower extermity edema is much improved, wounds are much better-continue with wound care. May return to work and see if doing  ok.  Curently he is only taking Lisinopril-HCTZ 20 mg/25 mg and also Coreg 6.25 daily for HTN.  F/u in  6 months or prn    ,  PHUONG, DO 12/02/2012 5:59 PM

## 2012-12-04 ENCOUNTER — Telehealth: Payer: Self-pay | Admitting: Radiology

## 2012-12-04 DIAGNOSIS — N289 Disorder of kidney and ureter, unspecified: Secondary | ICD-10-CM

## 2012-12-04 NOTE — Telephone Encounter (Signed)
Unfortunately patient did not get his labs drawn when he was in clinic to see Dr Conley Rolls. He needs to come in for lab only visit for this to be done. Called him to advise. Left message for him to call me back. Put order in for CMET

## 2012-12-07 ENCOUNTER — Other Ambulatory Visit (INDEPENDENT_AMBULATORY_CARE_PROVIDER_SITE_OTHER): Payer: Managed Care, Other (non HMO)

## 2012-12-07 DIAGNOSIS — N289 Disorder of kidney and ureter, unspecified: Secondary | ICD-10-CM

## 2012-12-07 LAB — COMPREHENSIVE METABOLIC PANEL
ALT: 20 U/L (ref 0–53)
BUN: 22 mg/dL (ref 6–23)
CO2: 22 mEq/L (ref 19–32)
Calcium: 9.3 mg/dL (ref 8.4–10.5)
Chloride: 106 mEq/L (ref 96–112)
Creat: 1.2 mg/dL (ref 0.50–1.35)
Total Bilirubin: 0.7 mg/dL (ref 0.3–1.2)

## 2012-12-07 LAB — COMPREHENSIVE METABOLIC PANEL WITH GFR
AST: 37 U/L (ref 0–37)
Albumin: 3.7 g/dL (ref 3.5–5.2)
Alkaline Phosphatase: 96 U/L (ref 39–117)
Glucose, Bld: 102 mg/dL — ABNORMAL HIGH (ref 70–99)
Potassium: 4.6 meq/L (ref 3.5–5.3)
Sodium: 135 meq/L (ref 135–145)
Total Protein: 8.1 g/dL (ref 6.0–8.3)

## 2012-12-07 NOTE — Progress Notes (Signed)
xPt here to get blood drawn for CMET. Done T. Lorelee Cover

## 2012-12-07 NOTE — Telephone Encounter (Signed)
Pt came in this a.m and got his labs drawn

## 2012-12-09 ENCOUNTER — Telehealth: Payer: Self-pay | Admitting: Radiology

## 2012-12-09 ENCOUNTER — Other Ambulatory Visit: Payer: Self-pay | Admitting: Family Medicine

## 2012-12-09 DIAGNOSIS — R9389 Abnormal findings on diagnostic imaging of other specified body structures: Secondary | ICD-10-CM

## 2012-12-09 NOTE — Telephone Encounter (Signed)
Called him, to advise

## 2012-12-09 NOTE — Telephone Encounter (Signed)
Left message for him to call me back.  

## 2012-12-09 NOTE — Telephone Encounter (Signed)
Message copied by Caffie Damme on Mon Dec 09, 2012 10:44 AM ------      Message from: [place], [place]      Created: Sun Dec 08, 2012  8:48 AM       Please tell him or leave a message on phone that his kidney and liver function are normal again since he stopped taking lasix and stopped drinking. Continue with just his HTN meds no more lasix unless he has leg swelling, if that occurs then call me. Keep up the good work. I will go ahead and order the MRI and he can.  ------

## 2012-12-10 NOTE — Telephone Encounter (Signed)
Labs mailed to patient. Unable to reach by phone.

## 2012-12-19 ENCOUNTER — Other Ambulatory Visit: Payer: Self-pay

## 2012-12-19 DIAGNOSIS — R935 Abnormal findings on diagnostic imaging of other abdominal regions, including retroperitoneum: Secondary | ICD-10-CM

## 2013-01-01 ENCOUNTER — Other Ambulatory Visit: Payer: Self-pay | Admitting: Family Medicine

## 2013-02-23 ENCOUNTER — Ambulatory Visit (INDEPENDENT_AMBULATORY_CARE_PROVIDER_SITE_OTHER): Payer: Managed Care, Other (non HMO) | Admitting: Family Medicine

## 2013-02-23 VITALS — BP 122/74 | HR 96 | Temp 97.0°F | Resp 16 | Ht 77.0 in | Wt 211.0 lb

## 2013-02-23 DIAGNOSIS — D649 Anemia, unspecified: Secondary | ICD-10-CM

## 2013-02-23 DIAGNOSIS — G2581 Restless legs syndrome: Secondary | ICD-10-CM

## 2013-02-23 LAB — IRON AND TIBC
%SAT: 6 % — ABNORMAL LOW (ref 20–55)
Iron: 30 ug/dL — ABNORMAL LOW (ref 42–165)
TIBC: 511 ug/dL — ABNORMAL HIGH (ref 215–435)
UIBC: 481 ug/dL — ABNORMAL HIGH (ref 125–400)

## 2013-02-23 LAB — POCT CBC
Granulocyte percent: 66.3 %G (ref 37–80)
HCT, POC: 32.5 % — AB (ref 43.5–53.7)
Hemoglobin: 9.7 g/dL — AB (ref 14.1–18.1)
Lymph, poc: 1 (ref 0.6–3.4)
MCH, POC: 27 pg (ref 27–31.2)
MCHC: 29.3 g/dL — AB (ref 31.8–35.4)
MCV: 90.5 fL (ref 80–97)
MID (cbc): 0.4 (ref 0–0.9)
MPV: 9.8 fL (ref 0–99.8)
POC Granulocyte: 2.9 (ref 2–6.9)
POC LYMPH PERCENT: 24.3 % (ref 10–50)
POC MID %: 9.4 %M (ref 0–12)
Platelet Count, POC: 93 10*3/uL — AB (ref 142–424)
RBC: 3.59 M/uL — AB (ref 4.69–6.13)
RDW, POC: 16 %
WBC: 4.3 10*3/uL — AB (ref 4.6–10.2)

## 2013-02-23 LAB — COMPREHENSIVE METABOLIC PANEL
ALT: 49 U/L (ref 0–53)
AST: 110 U/L — ABNORMAL HIGH (ref 0–37)
CO2: 22 mEq/L (ref 19–32)
Calcium: 9 mg/dL (ref 8.4–10.5)
Chloride: 104 mEq/L (ref 96–112)
Creat: 1.27 mg/dL (ref 0.50–1.35)
Sodium: 137 mEq/L (ref 135–145)
Total Protein: 7.6 g/dL (ref 6.0–8.3)

## 2013-02-23 LAB — COMPREHENSIVE METABOLIC PANEL WITH GFR
Albumin: 4 g/dL (ref 3.5–5.2)
Alkaline Phosphatase: 104 U/L (ref 39–117)
BUN: 34 mg/dL — ABNORMAL HIGH (ref 6–23)
Glucose, Bld: 98 mg/dL (ref 70–99)
Potassium: 3.9 meq/L (ref 3.5–5.3)
Total Bilirubin: 1.4 mg/dL — ABNORMAL HIGH (ref 0.3–1.2)

## 2013-02-23 LAB — FERRITIN: Ferritin: 52 ng/mL (ref 22–322)

## 2013-02-23 LAB — TRANSFERRIN: Transferrin: 422 mg/dL — ABNORMAL HIGH (ref 200–360)

## 2013-02-23 MED ORDER — LORAZEPAM 0.5 MG PO TABS: 0.5000 mg | ORAL_TABLET | Freq: Every evening | ORAL | Status: DC | PRN

## 2013-02-23 NOTE — Progress Notes (Signed)
[facility]:  Office Visit  Chief Complaint:  Chief Complaint  Patient presents with  . Extremity Weakness    HPI: Ronald Zhang is a 57 y.o. male who complains of :  1. He has restless leg  sxs and his leg has been jumping so he has picked up drinking again  x 1 month 2. He was recently in the hospital for chest pain and was released, he had cervical stenosis and had numbness and tingling. Chest pain workup was negative. 3. He has HTN and is well controlled , No SEs.  4. He has not gotten his MRI for abnormal [place] of pancreas.   IMPRESSION:  Multiple cystic foci identified within the pancreatic head and less  prominently in the pancreatic body. No discrete mass is identified  but further characterization is recommended with abdominal MRI with  contrast to evaluate for a cystic neoplastic process and determine  appropriate follow up.  Prominent liver size and splenomegaly with no worrisome focal  lesions seen in either organ. Question mild hepatic steatosis.  These findings can be further assessed at the time of MRI.  Question mild atheromatous change.  Incomplete gallbladder distention in an NPO patient with no focal  abnormality noted.   Past Medical History  Diagnosis Date  . Hypertension   . Allergy   . Pulmonary HTN     TEE on March 31,2014-mild-mod concentric hypertrophy.  EF 60-65%. Left atrial valve mildly dilated, right atrium mildly dilated, mild-moderate MR, mild TR. Pum Pressure 51 mm Hg  . Cellulitis and abscess of leg     Hospitalized at [facility] from 4/5-4/10/14 for bilateral Anjelika Ausburn edema and cellulitis. Was on IV Lasix and Vancomycin.   . Alcohol dependency     stopped drinking since November 09, 2012, restarted drinking on 01/24/13   Past Surgical History  Procedure Laterality Date  . Fracture surgery     History   Social History  . Marital Status: Single    Spouse Name: N/A    Number of Children: N/A  . Years of Education: N/A    Social History Main Topics  . Smoking status: Never Smoker   . Smokeless tobacco: None  . Alcohol Use: No  . Drug Use: No  . Sexually Active: No   Other Topics Concern  . None   Social History Narrative  . None   Family History  Problem Relation Age of Onset  . Hypertension Mother   . Hypertension Father    Allergies  Allergen Reactions  . Penicillins Swelling    Childhood   Prior to Admission medications   Medication Sig Start Date End Date Taking? Authorizing Provider  aspirin 81 MG tablet Take 81 mg by mouth daily.   Yes Historical Provider, MD  lisinopril-hydrochlorothiazide (PRINZIDE,ZESTORETIC) 20-25 MG per tablet Take 6 pills single dose for scabies 09/08/12  Yes Peyton Najjar, MD  clobetasol cream (TEMOVATE) 0.05 % Apply topically 2 (two) times daily. 10/05/12   Shirin Echeverry P Miliyah Luper, DO  furosemide (LASIX) 20 MG tablet Take 1/2 tab PO prn for swelling. Eat a banana with this. 09/22/12   Kynsleigh Westendorf P Toba Claudio, DO     ROS: The patient denies fevers, chills, night sweats, unintentional weight loss, chest pain, palpitations, wheezing, dyspnea on exertion, nausea, vomiting, abdominal pain, dysuria, hematuria, melena, numbness, weakness, or tingling.   All other systems have been reviewed and were otherwise negative with the exception of those mentioned in the HPI and as above.  PHYSICAL EXAM: Filed Vitals:   02/23/13 0846  BP: 122/74  Pulse: 96  Temp: 97 F (36.1 C)  Resp: 16   Filed Vitals:   02/23/13 0846  Height: 6\' 5"  (1.956 m)  Weight: 211 lb (95.709 kg)   Body mass index is 25.02 kg/(m^2).  General: Alert, no acute distress HEENT:  Normocephalic, atraumatic, oropharynx patent.  Cardiovascular:  Regular rate and rhythm, no rubs murmurs or gallops.  No Carotid bruits, radial pulse intact. No pedal edema.  Respiratory: Clear to auscultation bilaterally.  No wheezes, rales, or rhonchi.  No cyanosis, no use of accessory musculature GI: No organomegaly, abdomen is soft and  non-tender, positive bowel sounds.  No masses. Skin: No rashes. Neurologic: Facial musculature symmetric. Psychiatric: Patient is appropriate throughout our interaction. Lymphatic: No cervical lymphadenopathy Musculoskeletal: Gait intact.   LABS: Results for orders placed in visit on 02/23/13  POCT CBC      Result Value Range   WBC 4.3 (*) 4.6 - 10.2 K/uL   Lymph, poc 1.0  0.6 - 3.4   POC LYMPH PERCENT 24.3  10 - 50 %L   MID (cbc) 0.4  0 - 0.9   POC MID % 9.4  0 - 12 %M   POC Granulocyte 2.9  2 - 6.9   Granulocyte percent 66.3  37 - 80 %G   RBC 3.59 (*) 4.69 - 6.13 M/uL   Hemoglobin 9.7 (*) 14.1 - 18.1 g/dL   HCT, POC 62.1 (*) 30.8 - 53.7 %   MCV 90.5  80 - 97 fL   MCH, POC 27.0  27 - 31.2 pg   MCHC 29.3 (*) 31.8 - 35.4 g/dL   RDW, POC 65.7     Platelet Count, POC 93 (*) 142 - 424 K/uL   MPV 9.8  0 - 99.8 fL     EKG/XRAY:   Primary read interpreted by Dr. Conley Rolls at [facility].   ASSESSMENT/PLAN: Encounter Diagnoses  Name Primary?  . Restless leg Yes  . Anemia     Rx Ativan for restless legs and also prevention of alcohol withdrawal.  He started drinking again about 1 month ago to help with restless legs and states he is drinking 1/2 pint before bedtime.  Iron studies and CMP pending Will get MRI for abnormality of pancreas recscheduled , prefers  Monday AM. He wants to do it at [facility].  F/u    Cassandra Mcmanaman PHUONG, DO 02/23/2013 10:21 AM

## 2013-02-24 ENCOUNTER — Telehealth: Payer: Self-pay | Admitting: Family Medicine

## 2013-02-24 NOTE — Telephone Encounter (Signed)
LM to take otc iron pills 325 mg daily or BID, he will follow-up with me in 30 days. We will re-evaluate his restless legs. He should stop drinking alcohol since his bilirubin and liver enzymes are elevated again. We will get his MRI rescheduled.

## 2013-04-22 ENCOUNTER — Other Ambulatory Visit: Payer: Self-pay | Admitting: Family Medicine

## 2013-04-25 ENCOUNTER — Other Ambulatory Visit: Payer: Self-pay

## 2013-04-25 ENCOUNTER — Telehealth: Payer: Self-pay

## 2013-04-25 MED ORDER — LORAZEPAM 0.5 MG PO TABS: ORAL_TABLET | ORAL | Status: DC

## 2013-04-25 NOTE — Telephone Encounter (Signed)
Called in Lorazepam with 5 refills per Dr Conley Rolls.

## 2013-04-30 ENCOUNTER — Emergency Department (HOSPITAL_COMMUNITY)
Admission: EM | Admit: 2013-04-30 | Discharge: 2013-05-01 | Disposition: A | Discharge status: Home / Self Care | Payer: Managed Care, Other (non HMO) | Attending: Emergency Medicine | Admitting: Emergency Medicine

## 2013-04-30 ENCOUNTER — Encounter (HOSPITAL_COMMUNITY): Payer: Self-pay | Admitting: Emergency Medicine

## 2013-04-30 DIAGNOSIS — Z880 Allergy status to penicillin: Secondary | ICD-10-CM | POA: Insufficient documentation

## 2013-04-30 DIAGNOSIS — F1010 Alcohol abuse, uncomplicated: Secondary | ICD-10-CM

## 2013-04-30 DIAGNOSIS — D696 Thrombocytopenia, unspecified: Secondary | ICD-10-CM

## 2013-04-30 DIAGNOSIS — R748 Abnormal levels of other serum enzymes: Secondary | ICD-10-CM

## 2013-04-30 DIAGNOSIS — F1020 Alcohol dependence, uncomplicated: Secondary | ICD-10-CM | POA: Insufficient documentation

## 2013-04-30 DIAGNOSIS — D649 Anemia, unspecified: Secondary | ICD-10-CM

## 2013-04-30 DIAGNOSIS — K922 Gastrointestinal hemorrhage, unspecified: Secondary | ICD-10-CM

## 2013-04-30 DIAGNOSIS — F10929 Alcohol use, unspecified with intoxication, unspecified: Secondary | ICD-10-CM

## 2013-04-30 DIAGNOSIS — Z872 Personal history of diseases of the skin and subcutaneous tissue: Secondary | ICD-10-CM | POA: Insufficient documentation

## 2013-04-30 DIAGNOSIS — Z79899 Other long term (current) drug therapy: Secondary | ICD-10-CM | POA: Insufficient documentation

## 2013-04-30 DIAGNOSIS — Z7982 Long term (current) use of aspirin: Secondary | ICD-10-CM | POA: Insufficient documentation

## 2013-04-30 DIAGNOSIS — I2789 Other specified pulmonary heart diseases: Secondary | ICD-10-CM | POA: Insufficient documentation

## 2013-04-30 LAB — COMPREHENSIVE METABOLIC PANEL
ALT: 29 U/L (ref 0–53)
AST: 93 U/L — ABNORMAL HIGH (ref 0–37)
BUN: 16 mg/dL (ref 6–23)
CO2: 22 mEq/L (ref 19–32)
Calcium: 9.2 mg/dL (ref 8.4–10.5)
Chloride: 100 mEq/L (ref 96–112)
GFR calc Af Amer: 78 mL/min — ABNORMAL LOW (ref >90)
GFR calc non Af Amer: 67 mL/min — ABNORMAL LOW (ref >90)
Glucose, Bld: 115 mg/dL — ABNORMAL HIGH (ref 70–99)
Sodium: 137 mEq/L (ref 135–145)
Total Bilirubin: 1.4 mg/dL — ABNORMAL HIGH (ref 0.3–1.2)

## 2013-04-30 LAB — URINALYSIS, ROUTINE W REFLEX MICROSCOPIC
Bilirubin Urine: NEGATIVE
Glucose, UA: NEGATIVE mg/dL
Hgb urine dipstick: NEGATIVE
Ketones, ur: NEGATIVE mg/dL
Leukocytes, UA: NEGATIVE
Nitrite: NEGATIVE
Protein, ur: NEGATIVE mg/dL
Urobilinogen, UA: 2 mg/dL — ABNORMAL HIGH (ref 0.0–1.0)

## 2013-04-30 LAB — CBC WITH DIFFERENTIAL/PLATELET
Basophils Relative: 1 % (ref 0–1)
Eosinophils Relative: 4 % (ref 0–5)
Hemoglobin: 9.2 g/dL — ABNORMAL LOW (ref 13.0–17.0)
Lymphs Abs: 0.8 10*3/uL (ref 0.7–4.0)
MCH: 27.6 pg (ref 26.0–34.0)
MCHC: 31.7 g/dL (ref 30.0–36.0)
MCV: 87.1 fL (ref 78.0–100.0)
Monocytes Absolute: 0.9 10*3/uL (ref 0.1–1.0)
Monocytes Relative: 14 % — ABNORMAL HIGH (ref 3–12)
Neutro Abs: 4.3 10*3/uL (ref 1.7–7.7)
Neutrophils Relative %: 68 % (ref 43–77)
RBC: 3.33 MIL/uL — ABNORMAL LOW (ref 4.22–5.81)
WBC: 6.4 10*3/uL (ref 4.0–10.5)

## 2013-04-30 LAB — RAPID URINE DRUG SCREEN, HOSP PERFORMED
Amphetamines: NOT DETECTED
Barbiturates: NOT DETECTED
Benzodiazepines: NOT DETECTED
Opiates: NOT DETECTED
Tetrahydrocannabinol: NOT DETECTED

## 2013-04-30 LAB — OCCULT BLOOD, POC DEVICE: Fecal Occult Bld: POSITIVE — AB

## 2013-04-30 MED ORDER — FOLIC ACID 1 MG PO TABS
1.0000 mg | ORAL_TABLET | Freq: Every day | ORAL | Status: DC
  Administered 2013-04-30: 1 mg via ORAL
  Filled 2013-04-30: qty 1

## 2013-04-30 MED ORDER — THIAMINE HCL 100 MG/ML IJ SOLN: 100.0000 mg | Freq: Every day | INTRAMUSCULAR | Status: DC

## 2013-04-30 MED ORDER — LORAZEPAM 1 MG PO TABS
1.0000 mg | ORAL_TABLET | Freq: Four times a day (QID) | ORAL | Status: DC | PRN
  Filled 2013-04-30: qty 1

## 2013-04-30 MED ORDER — VITAMIN B-1 100 MG PO TABS
100.0000 mg | ORAL_TABLET | Freq: Every day | ORAL | Status: DC
  Administered 2013-04-30: 15:00:00 100 mg via ORAL
  Filled 2013-04-30: qty 1

## 2013-04-30 MED ORDER — LORAZEPAM 2 MG/ML IJ SOLN: 1.0000 mg | Freq: Four times a day (QID) | INTRAMUSCULAR | Status: DC | PRN

## 2013-04-30 MED ORDER — ADULT MULTIVITAMIN W/MINERALS CH
1.0000 | ORAL_TABLET | Freq: Every day | ORAL | Status: DC
  Administered 2013-04-30: 1 via ORAL
  Filled 2013-04-30: qty 1

## 2013-04-30 MED ORDER — SODIUM CHLORIDE 0.9 % IV BOLUS (SEPSIS)
1000.0000 mL | Freq: Once | INTRAVENOUS | Status: AC
  Administered 2013-04-30: 1000 mL via INTRAVENOUS

## 2013-04-30 NOTE — ED Notes (Signed)
Patient in blue scrubs and red socks.  

## 2013-04-30 NOTE — ED Notes (Signed)
Bedside report received from previous RN, Ajsa. 

## 2013-04-30 NOTE — ED Notes (Signed)
Per pt, wants detox from ETOH-states he drinks everyday-pint of bourbon QD-last drink was at midnight-has been drinking heavily for a year

## 2013-04-30 NOTE — BH Assessment (Addendum)
Assessment Note   Ronald Zhang is an 57 y.o. male.  Pt presents with C/O of Alcohol intoxication.   Pt reports that he was caught intoxicated  at his employer. Pt reports that he has a case Production designer, theatre/television/film and counselor that he thinks he was assigned to by his employer. Pt reports that his case manager recommended that he get treatment for his alcohol addiction.  Pt admits to drinking  1 pint of Bourbon  daily for the past year.  Pt reports his last use was on 04-30-13 around midnight (1 pint of Bourbon). Pt states that he is unable to return to work until he seeks treatment and gets help for his etoh addiction. Pt reports financial stressors as he is currently receiving short term disability as he is unable to work and earn his full salary.Pt is willing to seek substance abuse treatment. Pt was educated on the benefits of both inpatient and outpatient services . Pt denies SI,HI, and no AVH reported. Pt denies a hx of seizures or DT'S. Pt reports that he is  feeling a "little shaky", and anxious.  Pt declined inpatient detox at this time and prefers chemical dependence intensive  outpatient services at this time. Pt was given crisis resources and CDIOP information. Pt agreed to follow-up with Charmian Muff tommorow at [facility] to start the CDIOP program.   Consulted with Irish Elders who is in agreement with the recommendation of CDIOP. Consulted with Coral Ceo, PA, and EDP Dr. Effie Shy who agreed to Discharge patient.  Axis I: Alcohol Dependence Axis II: Deferred Axis III:  Past Medical History  Diagnosis Date  . Hypertension   . Allergy   . Pulmonary HTN     TEE on March 31,2014-mild-mod concentric hypertrophy.  EF 60-65%. Left atrial valve mildly dilated, right atrium mildly dilated, mild-moderate MR, mild TR. Pum Pressure 51 mm Hg  . Cellulitis and abscess of leg     Hospitalized at [facility] from 4/5-4/10/14 for bilateral LE edema and cellulitis. Was on IV Lasix and Vancomycin.   . Alcohol  dependency     stopped drinking since November 09, 2012, restarted drinking on 01/24/13   Axis IV: economic problems, occupational problems, other psychosocial or environmental problems and problems related to social environment Axis V: 31-40 impairment in reality testing  Past Medical History:  Past Medical History  Diagnosis Date  . Hypertension   . Allergy   . Pulmonary HTN     TEE on March 31,2014-mild-mod concentric hypertrophy.  EF 60-65%. Left atrial valve mildly dilated, right atrium mildly dilated, mild-moderate MR, mild TR. Pum Pressure 51 mm Hg  . Cellulitis and abscess of leg     Hospitalized at [facility] from 4/5-4/10/14 for bilateral LE edema and cellulitis. Was on IV Lasix and Vancomycin.   . Alcohol dependency     stopped drinking since November 09, 2012, restarted drinking on 01/24/13    Past Surgical History  Procedure Laterality Date  . Fracture surgery      Family History:  Family History  Problem Relation Age of Onset  . Hypertension Mother   . Hypertension Father     Social History:  reports that he has never smoked. He does not have any smokeless tobacco history on file. He reports that  drinks alcohol. He reports that he does not use illicit drugs.  Additional Social History:  Alcohol / Drug Use History of alcohol / drug use?: Yes Substance #1 Name of Substance 1:  (Etoh-Bourbon) 1 -  Age of First Use:  (17) 1 - Amount (size/oz):  (1 pint of Bourbon) 1 - Frequency:  (Daily) 1 - Duration:  (past year) 1 - Last Use / Amount:  (04/30/13- 1 pint of Bourbon)  CIWA: CIWA-Ar BP: 144/78 mmHg Pulse Rate: 109 Nausea and Vomiting: 2 Tactile Disturbances: very mild itching, pins and needles, burning or numbness Tremor: two Auditory Disturbances: not present Paroxysmal Sweats: barely perceptible sweating, palms moist Visual Disturbances: not present Anxiety: no anxiety, at ease Headache, Fullness in Head: none present Agitation: normal  activity Orientation and Clouding of Sensorium: oriented and can do serial additions CIWA-Ar Total: 6 COWS:    Allergies:  Allergies  Allergen Reactions  . Penicillins Swelling    Childhood    Home Medications:  (Not in a hospital admission)  OB/GYN Status:  No LMP for male patient.  General Assessment Data Location of Assessment: [facility] ED Is this a Tele or Face-to-Face Assessment?: Face-to-Face Is this an Initial Assessment or a Re-assessment for this encounter?: Initial Assessment Living Arrangements: Alone Can pt return to current living arrangement?: Yes Admission Status: Voluntary Is patient capable of signing voluntary admission?: Yes Transfer from: Home Referral Source: Other (case Production designer, theatre/television/film and counselor)     [facility] Crisis Care Plan Living Arrangements: Alone Name of Psychiatrist: No Current Provider Name of Therapist: Case manager and Counselor     Risk to self Suicidal Ideation: No Suicidal Intent: No Is patient at risk for suicide?: No Suicidal Plan?: No Access to Means: No What has been your use of drugs/alcohol within the last 12 months?: no Previous Attempts/Gestures: No How many times?:  (na) Other Self Harm Risks: none reported Triggers for Past Attempts: None known Intentional Self Injurious Behavior: None Family Suicide History: No Recent stressful life event(s): Conflict (Comment);Financial Problems Persecutory voices/beliefs?: No Depression: No Substance abuse history and/or treatment for substance abuse?: Yes Suicide prevention information given to non-admitted patients: Yes  Risk to Others Homicidal Ideation: No Thoughts of Harm to Others: No Current Homicidal Intent: No Current Homicidal Plan: No Access to Homicidal Means: No Identified Victim: na History of harm to others?: No Assessment of Violence: None Noted Violent Behavior Description: None Noted Does patient have access to weapons?: No Criminal Charges Pending?: No Does patient  have a court date: No  Psychosis Hallucinations: None noted Delusions: None noted  Mental Status Report Appear/Hygiene: Disheveled Eye Contact: Good Motor Activity: Tremors Speech: Logical/coherent Level of Consciousness: Alert Mood: Anxious Affect: Appropriate to circumstance Anxiety Level: Minimal Thought Processes: Coherent;Relevant Judgement: Unimpaired Orientation: Person;Place;Time;Situation Obsessive Compulsive Thoughts/Behaviors: None  Cognitive Functioning Concentration: Normal Memory: Recent Intact;Remote Intact IQ: Average Insight: Fair Impulse Control: Fair Appetite: Fair Weight Loss: 0 Weight Gain: 0 Sleep: No Change Total Hours of Sleep: 6 Vegetative Symptoms: None  ADLScreening [facility] Assessment Services) Patient's cognitive ability adequate to safely complete daily activities?: Yes Patient able to express need for assistance with ADLs?: Yes Independently performs ADLs?: Yes (appropriate for developmental age)  Prior Inpatient Therapy Prior Inpatient Therapy: No Prior Therapy Dates: na Prior Therapy Facilty/Provider(s): na Reason for Treatment: na  Prior Outpatient Therapy Prior Outpatient Therapy: Yes Prior Therapy Dates: Current Provider Prior Therapy Facilty/Provider(s): Pt reports he has a case Production designer, theatre/television/film and a counselor recommended by his employer Reason for Treatment: Alcohol Abuse  ADL Screening (condition at time of admission) Patient's cognitive ability adequate to safely complete daily activities?: Yes Is the patient deaf or have difficulty hearing?: No Does the patient have difficulty seeing, even when wearing glasses/contacts?:  No Does the patient have difficulty concentrating, remembering, or making decisions?: No Patient able to express need for assistance with ADLs?: Yes Does the patient have difficulty dressing or bathing?: No Independently performs ADLs?: Yes (appropriate for developmental age) Does the patient have difficulty  walking or climbing stairs?: No Weakness of Legs: None Weakness of Arms/Hands: None  Home Assistive Devices/Equipment Home Assistive Devices/Equipment: None    Abuse/Neglect Assessment (Assessment to be complete while patient is alone) Physical Abuse: Denies Verbal Abuse: Denies Sexual Abuse: Denies Exploitation of patient/patient's resources: Denies Values / Beliefs Spiritual Requests During Hospitalization: None   Advance Directives (For Healthcare) Advance Directive: Patient does not have advance directive;Patient would not like information    Additional Information 1:1 In Past 12 Months?: No CIRT Risk: No Elopement Risk: No Does patient have medical clearance?: Yes     Disposition:  Disposition Initial Assessment Completed for this Encounter: Yes Disposition of Patient: Outpatient treatment Type of outpatient treatment: Chemical Dependence - Intensive Outpatient  On Site Evaluation by:   Reviewed with Physician:    Gerline Legacy, MS, LCASA Assessment Counselor  04/30/2013 11:11 PM

## 2013-04-30 NOTE — ED Provider Notes (Signed)
CSN: [id number]     Arrival date & time 04/30/13  1121 History  This chart was scribed for non-physician practitioner Jillyn Ledger, PA-C working with Ward Givens, MD by Valera Castle, ED scribe. This patient was seen in room WTR4/WLPT4 and the patient's care was started at 1:10 PM.    Chief Complaint  Patient presents with  . ETOH detox     The history is provided by the patient. No language interpreter was used.   HPI Comments: Ronald Zhang is a 57 y.o. male with a h/o hypertension, cellulitis, and EtOH dependency who presents to the Emergency Department requesting detox from alcohol. He reports he went to work intoxicated at [employer] two days ago. He now has a case worker who told him he needed to come to the ER for detox. He reports that he has never been to detox before, and denies h/o seizures or DT's from withdrawal.  He reports that he has been sober earlier this year for a few months, but that he has been drinking about a pint of bourbon a day, for about a year.  His last drink was about midnight last night.  He denies any drug or tobacco use.  No suicidal or homicidal ideation.  He reports associated dehydration and cramping yesterday, but feels fine today.  He denies emesis, nausea, diarhea, dysuria, hematochezia, melena, abdominal pain, chest pain, fever, numbness, or any other associated symptoms.    Past Medical History  Diagnosis Date  . Hypertension   . Allergy   . Pulmonary HTN     TEE on March 31,2014-mild-mod concentric hypertrophy.  EF 60-65%. Left atrial valve mildly dilated, right atrium mildly dilated, mild-moderate MR, mild TR. Pum Pressure 51 mm Hg  . Cellulitis and abscess of leg     Hospitalized at [facility] from 4/5-4/10/14 for bilateral LE edema and cellulitis. Was on IV Lasix and Vancomycin.   . Alcohol dependency     stopped drinking since November 09, 2012, restarted drinking on 01/24/13   Past Surgical History  Procedure Laterality Date  .  Fracture surgery     Family History  Problem Relation Age of Onset  . Hypertension Mother   . Hypertension Father    History  Substance Use Topics  . Smoking status: Never Smoker   . Smokeless tobacco: Not on file  . Alcohol Use: Yes     Comment: pint a day    Review of Systems  Constitutional: Negative for fever, chills, diaphoresis, activity change, appetite change and fatigue.       Dehydration reported yesterday.  HENT: Negative for congestion, sore throat, rhinorrhea, neck pain and neck stiffness.   Eyes: Negative for visual disturbance.  Respiratory: Negative for cough and shortness of breath.   Cardiovascular: Negative for chest pain and leg swelling.  Gastrointestinal: Negative for nausea, vomiting, abdominal pain, diarrhea, constipation and blood in stool.  Genitourinary: Negative for dysuria.  Musculoskeletal: Positive for myalgias (Cramping.). Negative for back pain and gait problem.  Skin: Negative for wound.  Neurological: Negative for dizziness, weakness, light-headedness and headaches.  Psychiatric/Behavioral: Negative for suicidal ideas, hallucinations, confusion, sleep disturbance, self-injury and dysphoric mood. The patient is not nervous/anxious.   All other systems reviewed and are negative.    Allergies  Penicillins  Home Medications   Current Outpatient Rx  Name  Route  Sig  Dispense  Refill  . aspirin 81 MG tablet   Oral   Take 81 mg by mouth daily.         Marland Kitchen  aspirin-acetaminophen-caffeine (EXCEDRIN MIGRAINE) 250-250-65 MG per tablet   Oral   Take 1 tablet by mouth every 6 (six) hours as needed for pain.         . furosemide (LASIX) 80 MG tablet   Oral   Take 40 mg by mouth daily. Takes 1/2 tablet         . lisinopril-hydrochlorothiazide (PRINZIDE,ZESTORETIC) 20-25 MG per tablet   Oral   Take 1 tablet by mouth daily.         Marland Kitchen LORazepam (ATIVAN) 0.5 MG tablet   Oral   Take 0.5 mg by mouth at bedtime as needed (restless legs).           Triage Vitals: BP 141/121  Pulse 106  Temp(Src) 98.4 F (36.9 C) (Oral)  Resp 21  SpO2 98%  Filed Vitals:   04/30/13 1902 04/30/13 1904 04/30/13 2113 04/30/13 2325  BP: 153/76 136/67 144/78 163/80  Pulse: 112 115 109 111  Temp:   99.7 F (37.6 C)   TempSrc:   Oral   Resp:   17 18  SpO2:   100% 100%    Physical Exam  Nursing note and vitals reviewed. Constitutional: He is oriented to person, place, and time. He appears well-developed and well-nourished. No distress.  HENT:  Head: Normocephalic and atraumatic.  Right Ear: External ear normal.  Left Ear: External ear normal.  Nose: Nose normal.  Mouth/Throat: Oropharynx is clear and moist. No oropharyngeal exudate.  Eyes: Conjunctivae and EOM are normal. Pupils are equal, round, and reactive to light. Right eye exhibits no discharge. Left eye exhibits no discharge.  Neck: Normal range of motion. Neck supple. No tracheal deviation present.  Cardiovascular: Normal rate, regular rhythm, normal heart sounds and intact distal pulses.  Exam reveals no gallop and no friction rub.   No murmur heard. Pulmonary/Chest: Effort normal and breath sounds normal. No respiratory distress. He has no wheezes. He has no rales.  Abdominal: Soft. He exhibits no distension. There is no tenderness.  Musculoskeletal: Normal range of motion. He exhibits edema.  Trace pitting edema bilaterally.  Patient able to ambulate without difficulty or ataxia  Neurological: He is alert and oriented to person, place, and time.  Skin: Skin is warm and dry. He is not diaphoretic.  Psychiatric: He has a normal mood and affect. His behavior is normal.     ED Course  Procedures (including critical care time)  DIAGNOSTIC STUDIES: Oxygen Saturation is 98% on room air, normal by my interpretation.    COORDINATION OF CARE: 1:17 PM-Discussed treatment plan which includes CBC panel, CMP, UA, and drug screen with pt at bedside and pt agreed to plan.   Labs  Review Labs Reviewed  CBC WITH DIFFERENTIAL - Abnormal; Notable for the following:    RBC 3.33 (*)    Hemoglobin 9.2 (*)    HCT 29.0 (*)    RDW 20.1 (*)    Platelets 108 (*)    All other components within normal limits  COMPREHENSIVE METABOLIC PANEL - Abnormal; Notable for the following:    Glucose, Bld 115 (*)    Total Protein 8.4 (*)    AST 93 (*)    Alkaline Phosphatase 174 (*)    Total Bilirubin 1.4 (*)    GFR calc non Af Amer 67 (*)    GFR calc Af Amer 78 (*)    All other components within normal limits  ETHANOL - Abnormal; Notable for the following:    Alcohol, Ethyl (B) 145 (*)  All other components within normal limits  URINE RAPID DRUG SCREEN (HOSP PERFORMED)  URINALYSIS, ROUTINE W REFLEX MICROSCOPIC   Imaging Review No results found.  Results for orders placed during the hospital encounter of 04/30/13  CBC WITH DIFFERENTIAL      Result Value Range   WBC 6.4  4.0 - 10.5 K/uL   RBC 3.33 (*) 4.22 - 5.81 MIL/uL   Hemoglobin 9.2 (*) 13.0 - 17.0 g/dL   HCT 11.9 (*) 14.7 - 82.9 %   MCV 87.1  78.0 - 100.0 fL   MCH 27.6  26.0 - 34.0 pg   MCHC 31.7  30.0 - 36.0 g/dL   RDW 56.2 (*) 13.0 - 86.5 %   Platelets 108 (*) 150 - 400 K/uL   Neutrophils Relative % 68  43 - 77 %   Lymphocytes Relative 13  12 - 46 %   Monocytes Relative 14 (*) 3 - 12 %   Eosinophils Relative 4  0 - 5 %   Basophils Relative 1  0 - 1 %   Neutro Abs 4.3  1.7 - 7.7 K/uL   Lymphs Abs 0.8  0.7 - 4.0 K/uL   Monocytes Absolute 0.9  0.1 - 1.0 K/uL   Eosinophils Absolute 0.3  0.0 - 0.7 K/uL   Basophils Absolute 0.1  0.0 - 0.1 K/uL   Smear Review MORPHOLOGY UNREMARKABLE    COMPREHENSIVE METABOLIC PANEL      Result Value Range   Sodium 137  135 - 145 mEq/L   Potassium 3.7  3.5 - 5.1 mEq/L   Chloride 100  96 - 112 mEq/L   CO2 22  19 - 32 mEq/L   Glucose, Bld 115 (*) 70 - 99 mg/dL   BUN 16  6 - 23 mg/dL   Creatinine, Ser 7.84  0.50 - 1.35 mg/dL   Calcium 9.2  8.4 - 69.6 mg/dL   Total Protein 8.4  (*) 6.0 - 8.3 g/dL   Albumin 3.5  3.5 - 5.2 g/dL   AST 93 (*) 0 - 37 U/L   ALT 29  0 - 53 U/L   Alkaline Phosphatase 174 (*) 39 - 117 U/L   Total Bilirubin 1.4 (*) 0.3 - 1.2 mg/dL   GFR calc non Af Amer 67 (*) >90 mL/min   GFR calc Af Amer 78 (*) >90 mL/min  ETHANOL      Result Value Range   Alcohol, Ethyl (B) 145 (*) 0 - 11 mg/dL  URINALYSIS, ROUTINE W REFLEX MICROSCOPIC      Result Value Range   Color, Urine YELLOW  YELLOW   APPearance CLEAR  CLEAR   Specific Gravity, Urine 1.017  1.005 - 1.030   pH 6.5  5.0 - 8.0   Glucose, UA NEGATIVE  NEGATIVE mg/dL   Hgb urine dipstick NEGATIVE  NEGATIVE   Bilirubin Urine NEGATIVE  NEGATIVE   Ketones, ur NEGATIVE  NEGATIVE mg/dL   Protein, ur NEGATIVE  NEGATIVE mg/dL   Urobilinogen, UA 2.0 (*) 0.0 - 1.0 mg/dL   Nitrite NEGATIVE  NEGATIVE   Leukocytes, UA NEGATIVE  NEGATIVE  URINE RAPID DRUG SCREEN (HOSP PERFORMED)      Result Value Range   Opiates NONE DETECTED  NONE DETECTED   Cocaine NONE DETECTED  NONE DETECTED   Benzodiazepines NONE DETECTED  NONE DETECTED   Amphetamines NONE DETECTED  NONE DETECTED   Tetrahydrocannabinol NONE DETECTED  NONE DETECTED   Barbiturates NONE DETECTED  NONE DETECTED  OCCULT BLOOD, POC DEVICE  Result Value Range   Fecal Occult Bld POSITIVE (*) NEGATIVE     MDM   1. Alcohol intoxication   2. Alcohol abuse   3. Thrombocytopenia   4. Anemia   5. GI bleed   6. Elevated liver enzymes     Ronald Zhang is a 57 y.o. male with a h/o hypertension, cellulitis, and EtOH dependency who presents to the Emergency Department requesting detox from alcohol.  Labs including alcohol level, CBC, CMP, UA, drug screen ordered to further evaluate.  Thiamine, folate, and multivitamin given.  Orthostatic vitals.  1L normal saline.  Ativan ordered PRN    Rechecks  2:30 PM = CIWA 6  16:00 PM = Patient had episode of emesis in triage.  States "I tried eating on an empty stomach."  Denies nausea or abdominal pain.   Rectal exam performed at bedside with RN present. No gross blood. No external signs of hemorrhoids or fissures. No internal hemorrhoids or stool palpated in the rectal vault.  Prostate smooth, firm, and nontender to palpation. Occult stool collected.   8:40 PM = Patient speaking with behavioral health. Able to tolerate food and fluids without difficulty   8:50 PM = Signed out care to Dr. Effie Shy who will await recommendation from behavioral health for plan of care.     Patient was found to have alcohol intoxication with a hx of alcohol abuse.  Awaiting recommendation from behavioral health for plan of care.  Patient was found to have anemia (at his baseline) with a positive occult stool test.  He may have a GI bleed.  His BP is stable at this time.  His orthostatic vitals were positive and he was given IV fluids.  Patient is asymptomatic. He also has thrombocytopenia which appears to be his baseline as well.  He also has elevated liver enzymes and total bilirubin, which is his baseline in the past.  Patient will likely undergo intensive inpatient or outpatient therapy for his alcohol abuse.  He will need follow-up with his PCP for his chronic medical conditions.     Final impressions: 1. Alcohol intoxication 2. Alcohol abuse 3. Thromobocytopenia  4. Anemia  5. GI bleed  6. Elevated liver enzymes     Luiz Iron PA-C    This patient was discussed with Dr. Effie Shy    I personally performed the services described in this documentation, which was scribed in my presence. The recorded information has been reviewed and is accurate.   Jillyn Ledger, PA-C 05/02/13 0636  Jillyn Ledger, PA-C 05/02/13 [phone]

## 2013-04-30 NOTE — ED Notes (Signed)
Pt reports he wants detox from alcohol, sts he showed up at work "inehibriated" and in order to keep his job he needs to stop drinking. Denies any other psychiatric issues, denies SI/HI

## 2013-04-30 NOTE — ED Notes (Signed)
Pt has 1 belonging bag and 1 black backpack.

## 2013-04-30 NOTE — ED Provider Notes (Signed)
Patient reevaluated for consideration of going home. He feels better at this time. Blood pressure, heart rate, and mental status are all improved. He is comfortable now. He is tolerating oral nutrition and fluid.  He has been seen by TTS, who offered him inpatient psychiatric admission. The patient chose intensive outpatient treatment for alcohol abuse. He understands that he needs to avoid all alcohol intake.  He has been started on iron tablets. He, states that he has been on them. Previously, and is supposed to be on them now. He has not seen blood in his stool and denies feeling faint or syncope.  Results for orders placed during the hospital encounter of 04/30/13  CBC WITH DIFFERENTIAL      Result Value Range   WBC 6.4  4.0 - 10.5 K/uL   RBC 3.33 (*) 4.22 - 5.81 MIL/uL   Hemoglobin 9.2 (*) 13.0 - 17.0 g/dL   HCT 16.1 (*) 09.6 - 04.5 %   MCV 87.1  78.0 - 100.0 fL   MCH 27.6  26.0 - 34.0 pg   MCHC 31.7  30.0 - 36.0 g/dL   RDW 40.9 (*) 81.1 - 91.4 %   Platelets 108 (*) 150 - 400 K/uL   Neutrophils Relative % 68  43 - 77 %   Lymphocytes Relative 13  12 - 46 %   Monocytes Relative 14 (*) 3 - 12 %   Eosinophils Relative 4  0 - 5 %   Basophils Relative 1  0 - 1 %   Neutro Abs 4.3  1.7 - 7.7 K/uL   Lymphs Abs 0.8  0.7 - 4.0 K/uL   Monocytes Absolute 0.9  0.1 - 1.0 K/uL   Eosinophils Absolute 0.3  0.0 - 0.7 K/uL   Basophils Absolute 0.1  0.0 - 0.1 K/uL   Smear Review MORPHOLOGY UNREMARKABLE    COMPREHENSIVE METABOLIC PANEL      Result Value Range   Sodium 137  135 - 145 mEq/L   Potassium 3.7  3.5 - 5.1 mEq/L   Chloride 100  96 - 112 mEq/L   CO2 22  19 - 32 mEq/L   Glucose, Bld 115 (*) 70 - 99 mg/dL   BUN 16  6 - 23 mg/dL   Creatinine, Ser 7.82  0.50 - 1.35 mg/dL   Calcium 9.2  8.4 - 95.6 mg/dL   Total Protein 8.4 (*) 6.0 - 8.3 g/dL   Albumin 3.5  3.5 - 5.2 g/dL   AST 93 (*) 0 - 37 U/L   ALT 29  0 - 53 U/L   Alkaline Phosphatase 174 (*) 39 - 117 U/L   Total Bilirubin 1.4 (*)  0.3 - 1.2 mg/dL   GFR calc non Af Amer 67 (*) >90 mL/min   GFR calc Af Amer 78 (*) >90 mL/min  ETHANOL      Result Value Range   Alcohol, Ethyl (B) 145 (*) 0 - 11 mg/dL  URINALYSIS, ROUTINE W REFLEX MICROSCOPIC      Result Value Range   Color, Urine YELLOW  YELLOW   APPearance CLEAR  CLEAR   Specific Gravity, Urine 1.017  1.005 - 1.030   pH 6.5  5.0 - 8.0   Glucose, UA NEGATIVE  NEGATIVE mg/dL   Hgb urine dipstick NEGATIVE  NEGATIVE   Bilirubin Urine NEGATIVE  NEGATIVE   Ketones, ur NEGATIVE  NEGATIVE mg/dL   Protein, ur NEGATIVE  NEGATIVE mg/dL   Urobilinogen, UA 2.0 (*) 0.0 - 1.0 mg/dL  Nitrite NEGATIVE  NEGATIVE   Leukocytes, UA NEGATIVE  NEGATIVE  URINE RAPID DRUG SCREEN (HOSP PERFORMED)      Result Value Range   Opiates NONE DETECTED  NONE DETECTED   Cocaine NONE DETECTED  NONE DETECTED   Benzodiazepines NONE DETECTED  NONE DETECTED   Amphetamines NONE DETECTED  NONE DETECTED   Tetrahydrocannabinol NONE DETECTED  NONE DETECTED   Barbiturates NONE DETECTED  NONE DETECTED  OCCULT BLOOD, POC DEVICE      Result Value Range   Fecal Occult Bld POSITIVE (*) NEGATIVE    Patient Vitals for the past 24 hrs:  BP Temp Temp src Pulse Resp SpO2  04/30/13 2113 144/78 mmHg 99.7 F (37.6 C) Oral 109 17 100 %  04/30/13 1904 136/67 mmHg - - 115 - -  04/30/13 1902 153/76 mmHg - - 112 - -  04/30/13 1901 171/82 mmHg - - 111 - -  04/30/13 1437 141/72 mmHg 99.2 F (37.3 C) Oral 113 20 96 %  04/30/13 1321 141/121 mmHg 98.4 F (36.9 C) Oral 106 21 98 %     Assessment: Alcoholism, and alcohol intoxication. Asymptomatic gastrointestinal bleeding with anemia. He stable for discharge with outpatient treatment.   Plan : Followup for intensive outpatient treatment of alcoholism tomorrow, as scheduled. Take iron twice a day and followup with PCP in 2 weeks for a blood check . Return here, if needed, for problems.   Flint Melter, MD 04/30/13 2159

## 2013-04-30 NOTE — ED Provider Notes (Signed)
Pt reports he "sips" bourbon all day and drinks about a pint a day. 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 However he relates he messed up and he went to work drunk about 2 weeks ago. He reports he is seeing a Veterinary surgeon (twice since then) and a case manager who told him to come to the ED. Patient states he does not think he has a problem with alcohol. However he also mentions he was told "you can die from withdrawal". He is here to be "observed" while he is detoxed from alcohol.  Pt appears older than stated age. His left leg is obviously larger than his left which he relates to having had cellulitis in the past. His skin color is the same bilaterally. No paleness of toes.    Medical screening examination/treatment/procedure(s) were conducted as a shared visit with non-physician practitioner(s) and myself.  I personally evaluated the patient during the encounter  Devoria Albe, MD, Franz Dell, MD 04/30/13 [phone]

## 2013-04-30 NOTE — ED Notes (Signed)
Per Harvie Heck in security, patient and belongings both been wanded by security.

## 2013-04-30 NOTE — ED Provider Notes (Signed)
Reviewed his lab work with PA. His Hb was normal over a year ago. He has had an anemia since April when he was admitted to the hospital for cellulitis. We discussed getting a hemoccult. Pt has thrombocytopenia c/w his alcohol abuse, but is not in need of urgent intervention. Pt has a PCP that is following him.   16:00 Pt discussed with Dr Effie Shy at change of shift.   Ward Givens, MD 04/30/13 [phone]

## 2013-05-02 ENCOUNTER — Other Ambulatory Visit (HOSPITAL_COMMUNITY): Payer: Managed Care, Other (non HMO) | Attending: Psychiatry | Admitting: Psychology

## 2013-05-02 DIAGNOSIS — F1020 Alcohol dependence, uncomplicated: Secondary | ICD-10-CM

## 2013-05-03 NOTE — ED Provider Notes (Signed)
See prior note   Ward Givens, MD 05/03/13 [phone]

## 2013-05-05 ENCOUNTER — Encounter (HOSPITAL_COMMUNITY): Payer: Self-pay | Admitting: Psychology

## 2013-05-05 ENCOUNTER — Other Ambulatory Visit (HOSPITAL_COMMUNITY): Payer: Managed Care, Other (non HMO) | Admitting: Psychology

## 2013-05-05 DIAGNOSIS — I10 Essential (primary) hypertension: Secondary | ICD-10-CM

## 2013-05-05 DIAGNOSIS — F1020 Alcohol dependence, uncomplicated: Secondary | ICD-10-CM
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
## 2013-05-07 ENCOUNTER — Other Ambulatory Visit (HOSPITAL_COMMUNITY): Payer: Managed Care, Other (non HMO) | Attending: Psychiatry | Admitting: Psychology

## 2013-05-07 DIAGNOSIS — F1020 Alcohol dependence, uncomplicated: Secondary | ICD-10-CM | POA: Insufficient documentation

## 2013-05-07 DIAGNOSIS — I10 Essential (primary) hypertension: Secondary | ICD-10-CM

## 2013-05-07 MED ORDER — TRAZODONE HCL 50 MG PO TABS: 50.0000 mg | ORAL_TABLET | Freq: Every day | ORAL | Status: DC

## 2013-05-07 MED ORDER — ROPINIROLE HCL 0.25 MG PO TABS: 0.2500 mg | ORAL_TABLET | Freq: Every day | ORAL | Status: DC

## 2013-05-08 ENCOUNTER — Encounter (HOSPITAL_COMMUNITY): Payer: Self-pay | Admitting: Psychology

## 2013-05-08 NOTE — Progress Notes (Signed)
    Daily Group Progress Note  Program: CD-IOP   Group Time: 1-2:30 pm  Participation Level: Minimal  Behavioral Response: quiet  Type of Therapy: Process Group  Topic: Group Process; the first part of group was spent in process. Members shared about the past weekend and any issues or concerns in early recovery. One member admitted taking Ativan on Saturday and Sunday because of a non-stop migraine. Another group member reported he had taken some sleep medication because of his restless leg syndrome was keeping him from sleeping.  Another member reported he had secured a temporary sponsor and was supposed to meet him at his home tonight and then attend an AA meeting together. There was a good discussion and feedback provided among group members.   Group Time: 2:45- 4pm  Participation Level: Minimal  Behavioral Response: patient was sleepy and he didn't appear able to retain any of the session  Type of Therapy: Psycho-education Group  Topic: Communication Styles: Which One Are You? A psycho-ed session followed the break. A handout on communication was provided and members identified and discussed Four Communication Styles, including Aggressive, Passive, Passive-Aggressive, and Assertive. A discussion identifying the characteristics of each style and how they differ continued with members identifying the style they most displayed. The importance of being open, honest, and asking for what you need was emphasized. There was good feedback with most members agreeing they were passive-aggressive.    Summary: The patient reported he had had a bad weekend. He reported he couldn't sleep because of his restless leg syndrome and he had taken Ativan. He admitted it hadn't helped and he took more than was prescribed. The patient apologized about being very sleepy and somewhat out of it, but he explained that he hadn't slept at all. The patient was called on during the second half of group to read from  the handout, as every other group member had been, but he stated he didn't have his glasses and couldn't read the handout. It is unclear whether his story was true or he can't read. A UA was collected from him. He was not a part of group today and was more like a spectator, but not a very present one. Because of his reported use of Ativan, his sobriety date is 9/26.   Family Program: Family present? No   Name of family member(s):   UDS collected: Yes Results: positive for alcohol and Ativan  AA/NA attended?: No  Sponsor?: No   Azaiah Licciardi, LCAS

## 2013-05-09 ENCOUNTER — Other Ambulatory Visit (HOSPITAL_COMMUNITY): Payer: Managed Care, Other (non HMO) | Admitting: Psychology

## 2013-05-09 ENCOUNTER — Encounter (HOSPITAL_COMMUNITY): Payer: Self-pay

## 2013-05-09 DIAGNOSIS — I10 Essential (primary) hypertension: Secondary | ICD-10-CM

## 2013-05-09 DIAGNOSIS — F1020 Alcohol dependence, uncomplicated: Secondary | ICD-10-CM | POA: Diagnosis not present

## 2013-05-09 NOTE — Progress Notes (Signed)
Patient ID: Ronald Zhang, male   DOB: February 08, 1956, 57 y.o.   MRN: [id number]   Subjective: Briefly with Dwain to discuss the response to the medications he was started on 2 days ago, those being trazodone 50 mg at bedtime, and Requip 0.25 mg at bedtime. Also to discuss a continued taper from the Ativan. Dwain endorses that he is sleeping significantly better and that his restless leg syndrome is decreased. He reports that he sleeps about 2 hours at a time for a total of 8 hours. He also reports that he took one Ativan 0.5 mg the night he started the trazodone and Requip, and has not had any since. He also had questions about his urinary incontinence and the fact that he is experiencing increased knee pain. He reports that he did not take any Lasix this morning in order to not have another event of urinary incontinence during group today. He also plans to see a doctor who gave him a knee injection in the past about his knee pain.  Objective: Alert and oriented and in no acute distress, well nourished well-developed, good eye contact, fairly groomed and casually dressed, cognitive functioning still slightly clouded, memory he still mildly impaired, insight and judgment are lacking yet improving, mood seems to be mildly anxious with a congruent affect, speech is clear and coherent.  Assessment and Plan: We will continue the trazodone at 50 mg at bedtime, and increase the Requip to 0.5 mg at bedtime. He has been instructed to be vigilant for symptoms of benzodiazepine withdrawal. He is encouraged to see his tremor care provider about his urinary incontinence and possible adjustment to his Lasix. He is also encouraged to followup with his orthopedic doctor regarding his knee pain.

## 2013-05-09 NOTE — Progress Notes (Signed)
Psychiatric Assessment Adult  Patient Identification:  Ronald Zhang Date of Evaluation:  05/07/2013 Chief Complaint: Excessive alcohol consumption History of Chief Complaint:  No chief complaint on file.   HPI "Ronald Zhang" is a 57 year old divorced, employed, white male who is referred through his employee assistance program because he had been intoxicated at work for 3 weeks. He initially went to the emergency department at [facility] where he was observed for purposes of detoxification over an approximate 14 hour period, then discharged to begin the intensive outpatient program for chemical dependency here at [facility].   Ronald Zhang reports that he has been drinking since he was 17, and over the past year he has been drinking daily 1 pint of bourbon. His longest period of sobriety has been 6 weeks. He endorses that his alcohol consumption is causing problems with his hypertension and anemia. In November he had to undergo a stress test. He denies any past inpatient or outpatient psychiatric care. He denies that he has any symptoms of depression or anxiety. He complains of restless leg syndrome for which he is prescribed Ativan. He endorses significantly overusing his Ativan.  Review of Systems  Constitutional: Negative.   HENT: Negative.   Eyes: Negative.   Respiratory: Negative.   Cardiovascular: Negative.   Gastrointestinal: Negative.   Genitourinary: Positive for urgency.       Urinary incontenence  Musculoskeletal: Positive for gait problem.       Deformity of lower extremities  Skin: Negative.   Allergic/Immunologic: Positive for environmental allergies.  Neurological: Negative.   Hematological: Negative.   Psychiatric/Behavioral: Positive for sleep disturbance.   Physical Exam  Constitutional: He is oriented to person, place, and time. He appears well-developed and well-nourished.  HENT:  Head: Normocephalic and atraumatic.  Eyes: Conjunctivae are normal. Pupils  are equal, round, and reactive to light.  Neck: Normal range of motion.  Musculoskeletal:  Severe deformity of lower extremities below the knee.  Neurological: He is alert and oriented to person, place, and time.    Depressive Symptoms: Denies symptoms of depression  (Hypo) Manic Symptoms:   Denies symptoms of mania  Anxiety Symptoms: Denies symptoms of anxiety  Psychotic Symptoms: Denies any history of psychosis  PTSD Symptoms: Denies any symptoms of PTSD   Traumatic Brain Injury: No   Past Psychiatric History: Diagnosis: None   Hospitalizations: Denies   Outpatient Care: Denies   Substance Abuse Care: Denies   Self-Mutilation: Denies   Suicidal Attempts: Denies   Violent Behaviors: Denies    Past Medical History:   Past Medical History  Diagnosis Date  . Hypertension   . Allergy   . Pulmonary HTN     TEE on March 31,2014-mild-mod concentric hypertrophy.  EF 60-65%. Left atrial valve mildly dilated, right atrium mildly dilated, mild-moderate MR, mild TR. Pum Pressure 51 mm Hg  . Cellulitis and abscess of leg     Hospitalized at [facility] from 4/5-4/10/14 for bilateral LE edema and cellulitis. Was on IV Lasix and Vancomycin.   . Alcohol dependency     stopped drinking since November 09, 2012, restarted drinking on 01/24/13  . Urinary incontinence    History of Loss of Consciousness:  No Seizure History:  No Cardiac History:  No Allergies:   Allergies  Allergen Reactions  . Penicillins Swelling    Childhood   Current Medications:  Current Outpatient Prescriptions  Medication Sig Dispense Refill  . aspirin 81 MG tablet Take 81 mg by mouth daily.      Marland Kitchen  aspirin-acetaminophen-caffeine (EXCEDRIN MIGRAINE) 250-250-65 MG per tablet Take 1 tablet by mouth every 6 (six) hours as needed for pain.      . furosemide (LASIX) 80 MG tablet Take 40 mg by mouth daily. Takes 1/2 tablet      . lisinopril-hydrochlorothiazide (PRINZIDE,ZESTORETIC) 20-25 MG per tablet Take 1  tablet by mouth daily.      Marland Kitchen LORazepam (ATIVAN) 0.5 MG tablet Take 0.5 mg by mouth at bedtime as needed (restless legs).      Marland Kitchen rOPINIRole (REQUIP) 0.25 MG tablet Take 1 tablet (0.25 mg total) by mouth at bedtime.  90 tablet  0  . traZODone (DESYREL) 50 MG tablet Take 1 tablet (50 mg total) by mouth at bedtime.  30 tablet  0   No current facility-administered medications for this visit.    Previous Psychotropic Medications:  Medication Dose   Ativan   0.5 mg at bedtime                      Substance Abuse History in the last 12 months: Substance Age of 1st Use Last Use Amount Specific Type  Nicotine          Alcohol  17  04/30/13 1 pint bourbon  Cannabis          Opiates          Cocaine          Methamphetamines          LSD          Ecstasy           Benzodiazepines    current 3.5mg  Ativan  Caffeine          Inhalants          Others:                          Medical Consequences of Substance Abuse: elevated transaminases, anemia, HTN  Legal Consequences of Substance Abuse: None  Family Consequences of Substance Abuse: None  Blackouts:  No DT's:  No Withdrawal Symptoms:  Yes Tremors  Social History: "Ronald Zhang" was born and grew up in [place], [place]. He has 3 younger brothers. His parents are still together. He reports he had a good childhood. He graduated from high school. He has worked for [employer] for 35 years. He married once, and has been divorced for 30 years. He has twins, a boy and girl. He denies any legal difficulties. His hobbies include family genealogy, studying Southern history, and participating in Civil War reenactments. 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 His social support system consists of friends.   Family History:   Family History  Problem Relation Age of Onset  . Hypertension Mother   . Hypertension Father   . Alcohol abuse Brother     Mental Status Examination/Evaluation: Objective:  Appearance: Disheveled  Eye  Contact::  Good  Speech:  Clear and Coherent  Volume:  Normal  Mood:  Dysphoric  Affect:  Blunt  Thought Process:  Linear  Orientation:  Full (Time, Place, and Person)  Thought Content:  WDL  Suicidal Thoughts:  No  Homicidal Thoughts:  No  Judgement:  Impaired  Insight:  Shallow  Psychomotor Activity:  Decreased  Akathisia:  No  Handed:    AIMS (if indicated):    Assets:  Communication Skills Housing Vocational/Educational    Laboratory/X-Ray Psychological Evaluation(s)  Randon UDS     Assessment:  AXIS I Alcohol Abuse and Substance Induced Mood Disorder  AXIS II Deferred  AXIS III Past Medical History  Diagnosis Date  . Hypertension   . Allergy   . Pulmonary HTN     TEE on March 31,2014-mild-mod concentric hypertrophy.  EF 60-65%. Left atrial valve mildly dilated, right atrium mildly dilated, mild-moderate MR, mild TR. Pum Pressure 51 mm Hg  . Cellulitis and abscess of leg     Hospitalized at [facility] from 4/5-4/10/14 for bilateral LE edema and cellulitis. Was on IV Lasix and Vancomycin.   . Alcohol dependency     stopped drinking since November 09, 2012, restarted drinking on 01/24/13  . Urinary incontinence      AXIS IV occupational problems, other psychosocial or environmental problems, problems related to social environment and problems with primary support group  AXIS V 41-50 serious symptoms   Treatment Plan/Recommendations:  Plan of Care: Admit to [facility] where he will attend group therapy sessions 3 days weekly for 3 hours each session. Initiate trazodone and Requip to assist in sleep and Ativan taper.   Laboratory:  UDS  Psychotherapy: Attend groups   Medications: Trazodone 50 mg at bedtime, Requip 0.25 mg at bedtime   Routine PRN Medications:  No  Consultations: None   Safety Concerns:  Risk for withdrawal symptoms, risk for relapse affect is   Other:     Yolande Jolly, MHS, PA-C  [facility] 10/3/20149:14 AM

## 2013-05-12 ENCOUNTER — Other Ambulatory Visit (HOSPITAL_COMMUNITY): Payer: Managed Care, Other (non HMO) | Admitting: Psychology

## 2013-05-12 ENCOUNTER — Encounter (HOSPITAL_COMMUNITY): Payer: Self-pay | Admitting: Psychology

## 2013-05-12 DIAGNOSIS — F1020 Alcohol dependence, uncomplicated: Secondary | ICD-10-CM | POA: Diagnosis not present

## 2013-05-12 NOTE — Progress Notes (Signed)
    Daily Group Progress Note  Program: [facility]-IOP   Group Time: 1-2:30 pm  Participation Level: Active  Behavioral Response: Sharing  Type of Therapy: Process Group  Topic: Group Process: the first part of group was spent in process. Members shared about current issues and concerns. One member who had been impaired on Wednesday appeared and he apologized to his fellow group members. A new group member was present and he shared a little about himself and what he was needing from the group. During this session, the medical director, AW, saw the new patient and met with members about current medications, amounts, and any problems that they might be experiencing.   Group Time: 2:45-4pm  Participation Level: Minimal  Behavioral Response: inattentive  Type of Therapy: Psycho-education Group  Topic: Resentments: The second half of group was spent in a presentation on resentments. The damage done by holding onto resentments was discussed. It included a handout and discussion about what benefits come with resentments and why people might hold onto them. Members shared about realizations about what they have been holding onto and admitted there were benefits to those resentments. There was good insight among the group and feedback proved very effective. The session went very well with good disclosure among the group members.    Summary: The patient appeared much more cogent today and reported he had finally gotten some sleep last night. He referred to the new medication prescribed by the medical director. The patient shared a little bit more about himself which he has done in almost every session. He provides little comment or feedback about the topic for the day, but manages to give the group a little story about himself. He provided nothing about himself or his manner of communication in the second half of group. He shared that he would spend some time at his parents home and would get something good  to eat. His sobriety date is 10/1.  Family Program: Family present? No   Name of family member(s):   UDS collected: No Results:   AA/NA attended?: No, patient has not yet attended any meetings.   Sponsor?: No   Ronald Zhang, LCAS

## 2013-05-13 ENCOUNTER — Encounter (HOSPITAL_COMMUNITY): Payer: Self-pay | Admitting: Psychology

## 2013-05-13 NOTE — Progress Notes (Signed)
    Daily Group Progress Note  Program: [facility]-IOP   Group Time: 1-2:30 pm  Participation Level: Active  Behavioral Response: Sharing  Type of Therapy: Process Group  Topic: Group Process; the first part of group was spent in process. Members shared about the past weekend and events or activities they took part in to support their recovery. One member reported a new sobriety date. He disclosed having drunk on 2 different occasions. He has been kicked out of the [employer] where he had been living for the past 2 months.  The group assisted in processing the relapse and offered good feedback.   Group Time: 2:45- 4pm  Participation Level: Minimal  Behavioral Response: Sharing  Type of Therapy: Psycho-education Group  Topic: Forgiveness: the second half of group was spent in a session on Forgiveness. This psycho-ed followed a two day presentation on Resentments. The group shared their own experiences on forgiveness. A number of handouts were provided detailing how one might go about 'forgiving' another person, entity or institution. The session proved very effective for all present.   Summary: The patient reported he had actually 'slept' this weekend. He reported he spent most of the weekend with his parents. He shared about his engagement in the [employer] and provided the group with a little history. At one point, I cut him off as he began a story and instructed him to address the issue at hand. The patient has yet to actually engage in a discussion on any of the group topics. He has been, admittedly, not quite 'himself' due to problems with sleep, but today he seemed much clearer and attentive. He provided a drug test and stated he had not taken the Ativan as instructed by the medical director. His sobriety date is currently 10/1.    Family Program: Family present? No   Name of family member(s):   UDS collected: Yes Results: not returned AA/NA attended?:  No  Sponsor?: No   Ko Bardon, LCAS

## 2013-05-14 ENCOUNTER — Other Ambulatory Visit (HOSPITAL_COMMUNITY): Payer: Managed Care, Other (non HMO) | Admitting: Psychology

## 2013-05-14 DIAGNOSIS — F1020 Alcohol dependence, uncomplicated: Secondary | ICD-10-CM | POA: Diagnosis not present

## 2013-05-16 ENCOUNTER — Other Ambulatory Visit (HOSPITAL_COMMUNITY): Payer: Managed Care, Other (non HMO) | Admitting: Psychology

## 2013-05-16 DIAGNOSIS — F1020 Alcohol dependence, uncomplicated: Secondary | ICD-10-CM

## 2013-05-16 DIAGNOSIS — I10 Essential (primary) hypertension: Secondary | ICD-10-CM

## 2013-05-17 ENCOUNTER — Encounter (HOSPITAL_COMMUNITY): Payer: Self-pay | Admitting: Psychology

## 2013-05-17 NOTE — Progress Notes (Signed)
    Daily Group Progress Note  Program: [facility]-IOP   Group Time: 1-2:30 pm  Participation Level: Minimal  Behavioral Response: Sharing  Type of Therapy: Process Group  Topic: Group Process: the first part of group was spent in process. Members shared about challenges or struggles they are dealing with in early recovery. One member did not present as the group as known him and they continued to express concerns about him. Another member took a bottle of pills out of his pocket and  was immediately challenged by members. The session proved very difficult, with continued denial and frustration, but it provided a very different perspective for group members who were seeing things from the other side - as one looking as the addict is using.   Group Time: 2:45- 4pm  Participation Level: Minimal  Behavioral Response: Sharing  Type of Therapy: Psycho-education Group  Topic: Communication Styles: Part II. The second half of group was spent in an ongoing presentation on communication. The presentation explored the topic discussed initially during the last group session. The 'benefits' of passivity and passive-aggressive communication styles were discussed at length. Members made good comments and the session proved effective.   Summary: The patient checked-in with a sobriety date of 9/16, but was quickly corrected due to his drug use early this morning. He had admitted taking Ativan after midnight due to sleep problems. He corrected his sobriety date to 10/1. The patient seemed disinterested in the group disclosure and pulled out a bottle of pills from his pocket and was looking at them. One group member and then others addressed this behaviors and reminded him of this trigger this represented. He was quickly apologetic and seemed to understand the insult he had brought. He did not provide any feedback or comment about the discussion or issues and appeared very tired and almost unable to comprehend the  conversation.    Family Program: Family present? No   Name of family member(s):   UDS collected: No Results:   AA/NA attended?: No  Sponsor?: No   Keiosha Cancro, LCAS

## 2013-05-18 ENCOUNTER — Encounter (HOSPITAL_COMMUNITY): Payer: Self-pay | Admitting: Psychology

## 2013-05-18 NOTE — Progress Notes (Signed)
    Daily Group Progress Note  Program: CD-IOP   Group Time: 1-2:30 pm  Participation Level: Minimal  Behavioral Response: Sharing  Type of Therapy: Process Group  Topic: Group Process: the first part of group was spent in process. A fellow therapist, HB, appeared during this session. She shared about her experiences attending Al-Anon meetings and how much she has benefitted from them.  This therapist emphasized the importance of separating the "Addiction" from the "Person". She explained that the purpose of group, in part, is to point out any addictive behaviors, attitudes, and thoughts other group members may be displaying. Frequently, the addict is the last one to see what others can see clearly. We must help each other become more conscious and aware of how they present. There was good discussion and feedback among group members.  Group Time: 2:45- 4pm  Participation Level: Minimal  Behavioral Response: Sharing  Type of Therapy: Psycho-education Group  Topic: Chaplain: second half of group was spent in a visit with one of the [employer]. He brought with him some quotes and a discussion on Forgiveness. Members shared their views on various quotes and provided their own explanations about forgiveness. There was a good disclosure among members and they responded very well to his visit and the intervention. At the break, it was recognized that one of the members appeared impaired. A BAC was collected and he was positive with a 1.3. His keys were collected and his wife contacted to drive to the clinic. The group shared their feelings about this event - the second in a week for the same member.    Summary: The patient reported he had decided to stay with his parents for a while. They are good company and he won't get as lonely as staying at his home by himself. He reported he had not attended any 12-step meetings, but planned on meeting another group member  tonight in [place]. He was attentive with the chaplain and was the last one to share his thoughts on the quote he had chosen. He made some good comments and seemed to enjoy himself. He made a joke about the Chaplain's beard - it was in good fun - and everyone laughed. The patient reported his sobriety date remains 10/1.    Family Program: Family present? No   Name of family member(s):   UDS collected: No Results:   AA/NA attended?: No  Sponsor?: No   Nicolena Schurman, LCAS

## 2013-05-19 ENCOUNTER — Other Ambulatory Visit (HOSPITAL_COMMUNITY): Payer: Managed Care, Other (non HMO) | Admitting: Psychology

## 2013-05-19 ENCOUNTER — Encounter (HOSPITAL_COMMUNITY): Payer: Self-pay | Admitting: Psychology

## 2013-05-19 DIAGNOSIS — F1020 Alcohol dependence, uncomplicated: Secondary | ICD-10-CM | POA: Diagnosis not present

## 2013-05-19 DIAGNOSIS — I10 Essential (primary) hypertension: Secondary | ICD-10-CM

## 2013-05-19 NOTE — Progress Notes (Signed)
    Daily Group Progress Note  Program: CD-IOP   Group Time: 1-2:30 pm  Participation Level: Active  Behavioral Response: sharing but off topic  Type of Therapy: Process Group  Topic: Group members checked in by sharing their sobriety dates and one thing they had done to support their recovery since the previous group session.  All group members reported attending AA or NA meetings over the last 2 days and discussed their experience at the meetings.  The group also discussed separating their behaviors during active addiction from themselves as people.  Group Time: 2:45-4pm  Participation Level: Minimal  Behavioral Response: Sharing  Type of Therapy: Psycho-education Group  Topic: The group completed the Wheel of Life exercise.  They rated their satisfaction with 8 different areas of life (physical environment/home, fun and recreation, health, personal growth and spirituality, significant other and romance, friends and family, career, and money) by creating a new outer edge in each section of the wheel, such that a "fuller" section indicated more satisfaction.  Group members first completed the exercise on paper and then shared their responses by marking a wheel drawn on the board and discussing their choices.  The group discussed the importance of balance in these different life areas and discussed areas where they could make improvements.  Summary: Patient reported a sobriety date of 10/1 and shared that he had attended his first NA meeting, where he saw a coworker.  He said he felt welcomed at the group but reported that he would prefer to attend AA meetings in the future instead.  During the Wheel of Life exercise, patient reported relatively high satisfaction with multiple areas, including physical environment, career, money, and friends and family.  He shared some concern about possibly losing his job due to his alcohol use. He reported very low satisfaction in the significant other  and romance category.    Family Program: Family present? No   Name of family member(s):   UDS collected: No Results  AA/NA attended?: YesThursday  Sponsor?: No   Silvana Carmack, LCAS

## 2013-05-20 ENCOUNTER — Encounter (HOSPITAL_COMMUNITY): Payer: Self-pay | Admitting: Psychology

## 2013-05-20 NOTE — Progress Notes (Signed)
    Daily Group Progress Note  Program: CD-IOP   Group Time: 1-2:30 pm  Participation Level: Active  Behavioral Response: Appropriate  Type of Therapy: Process Group  Topic: Group process; the first part of group was spent in process. Members shared about the past weekend and things they did to support their recovery. A new member was present and she introduced herself briefly. One member had relapsed, but had denied it last week. Upon disclosing the details, it was broken down on the board and the numerous options to circumvent the relapse discussed at length.   Group Time: 2:45- 4pm  Participation Level: Minimal  Behavioral Response: Sharing  Type of Therapy: Psycho-education Group  Topic: Self-Esteem; Part l: the second half of group was spent in a psycho-ed on Self-Esteem. Handouts were provided and the group discussed what 'self-esteem' is and how low self-esteem develops. With the exception of one member, all other group members admitted they have a poor self-esteem and every one of them identified the desire to challenge their negative core beliefs and feel better about themselves.   Summary: This patient reported he had attended an AA meeting over the weekend and he showed the group his starter chip along with his key chain he had received last week when he attended an NA meeting. He reported he had spent a long time in the ED with his father who had fallen twice over the weekend. The patient reported he had a 'good self-esteem' and felt very good about himself. He did share about himself and his troubling relationships, but noted he has no interest in any relationship going forward. He brought a Bible and offered it to the group room. He denied using anything over the weekend and his sobriety date remained 10/1. A UA was collected and any traces of Ativan will be discussed.  Family Program: Family present? No   Name of family member(s):   UDS collected: Yes Results: not  returned from lab  AA/NA attended?: YesSaturday  Sponsor?: No   Maicee Ullman, LCAS

## 2013-05-21 ENCOUNTER — Other Ambulatory Visit (HOSPITAL_COMMUNITY): Payer: Managed Care, Other (non HMO) | Admitting: Psychology

## 2013-05-21 DIAGNOSIS — F1020 Alcohol dependence, uncomplicated: Secondary | ICD-10-CM | POA: Diagnosis not present

## 2013-05-23 ENCOUNTER — Other Ambulatory Visit (HOSPITAL_COMMUNITY): Payer: Managed Care, Other (non HMO) | Admitting: Psychology

## 2013-05-23 DIAGNOSIS — F1020 Alcohol dependence, uncomplicated: Secondary | ICD-10-CM | POA: Diagnosis not present

## 2013-05-23 DIAGNOSIS — I10 Essential (primary) hypertension: Secondary | ICD-10-CM

## 2013-05-25 ENCOUNTER — Encounter (HOSPITAL_COMMUNITY): Payer: Self-pay | Admitting: Psychology

## 2013-05-25 NOTE — Progress Notes (Signed)
    Daily Group Progress Note  Program: CD-IOP   Group Time: 1-2:30 pm  Participation Level: Active  Behavioral Response: Appropriate and Sharing  Type of Therapy: Process Group  Topic:Group process; the first part of group was spent in process. Two new group members were present and during this part of group, they shared about themselves. They received a warm welcome and good feedback from their new fellow group members. There was good disclosure among the group.  Group Time: 2:45- 4pm  Participation Level: Minimal  Behavioral Response: Sharing  Type of Therapy: Psycho-education Group  Topic: Common Defenses or Forms of Denial. The second half of group was spent in a psycho-ed on forms of denial. A handout was provided with examples of different types of these defenses, including such means as "minimizing, rationalizing, blaming, diversion and bargaining" members provided examples of times they used these different defense mechanisms to promote or perpetuate their drug use. Almost everyone had used all of them at one time or another, but some were more 'popular' than others. The session invited honesty about previous denial and defenses used during one's active addiction. The session proved effective for group members and allowed them to witness, more openly, the ways they kept others, or themselves, from facing the truth about their addictions.   Summary: The patient reported he had attended at least two AA meetings since the last group session and has met a lot of nice people. He displayed some good understanding of the meetings and was given a copy of the Big Book by a total stranger. In the second half of group, the patient denied having used denial or defense mechanisms, but was challenged about hiding his drinking from his parents and children. He agreed that this was true, but hadn't really thought about it. The patient was reminded that the 'sins of omission' or leaving out  information is just as manipulative and intentional as making untrue statements. The patient continues to be resistant to the notion that his alcohol use has been a problem or caused him to do things that clashed with his value system. He continues to display significant denial about the impact of his drinking on his relationships and health. His sobriety date remains 10/1. The patient was pleased to hear that the drug test collected on Monday was negative on all counts.    Family Program: Family present? No   Name of family member(s):   UDS collected: No Results: one collected on Monday was negative  AA/NA attended?: [place] and Tuesday  Sponsor?: No, but he is actively looking for a temporary sponsor   Markesha Hannig, LCAS

## 2013-05-26 ENCOUNTER — Other Ambulatory Visit (HOSPITAL_COMMUNITY): Payer: Managed Care, Other (non HMO) | Admitting: Psychology

## 2013-05-26 ENCOUNTER — Encounter (HOSPITAL_COMMUNITY): Payer: Self-pay | Admitting: Psychology

## 2013-05-26 DIAGNOSIS — I10 Essential (primary) hypertension: Secondary | ICD-10-CM

## 2013-05-26 DIAGNOSIS — F1020 Alcohol dependence, uncomplicated: Secondary | ICD-10-CM

## 2013-05-26 NOTE — Progress Notes (Signed)
    Daily Group Progress Note  Program: CD-IOP   Group Time: 1-2:30 pm  Participation Level: Active  Behavioral Response: Sharing  Type of Therapy: Process Group  Topic: Process Group: Group members checked in by sharing their sobriety dates and what they had done since the last meeting to support their recovery.  Several group members also shared about challenges they had experienced that week, anxieties they were facing, and issues related to their recovery.  The group also completed a guided relaxation exercise led by the counselor, then discussed how they felt afterward and how they could incorporate relaxation into their daily lives.  Group Time: 2:45-4pm  Participation Level: Minimal  Behavioral Response: Sharing and Passive-Aggressive  Type of Therapy: Psycho-education Group  Topic: Psychoeducation Group:  Group members discussed behavioral and cognitive methods for increasing their self-esteem.  They shared about behaviors and activities that make them feel good about themselves, then discussed ways they could increase these behaviors in their daily lives as well as ways they could identify new beneficial behaviors.  Group members then completed an activity related to negative self-talk.  They wrote down some of their own negative self-talk, then another group member read these phrases out loud to them and provided feedback.  At the end of the exercise, each group member wrote down positive self-talk that the other person could use instead.  Several group members became tearful during this exercise as they shared their negative beliefs about themselves.  Summary: Patient reported a sobriety date of 10/1 and shared that he had had an "epiphany" at an AA meeting on Wednesday when a man he met there purchased him a Big Book.  He shared that he was enjoying going to meetings and feeling better than he had before.  During the psychoeducation group he shared that participating in Civil  War reenactments and memorials boosted his self-esteem.  He reported that he rarely experienced negative self-talk.  Family Program: Family present? No   Name of family member(s):   UDS collected: No Results:   AA/NA attended?: YesWednesday and Thursday  Sponsor?: No   Priscella Donna, LCAS

## 2013-05-28 ENCOUNTER — Other Ambulatory Visit (HOSPITAL_COMMUNITY): Payer: Managed Care, Other (non HMO) | Admitting: Psychology

## 2013-05-28 DIAGNOSIS — I10 Essential (primary) hypertension: Secondary | ICD-10-CM

## 2013-05-28 DIAGNOSIS — F1020 Alcohol dependence, uncomplicated: Secondary | ICD-10-CM | POA: Diagnosis not present

## 2013-05-29 ENCOUNTER — Encounter (HOSPITAL_COMMUNITY): Payer: Self-pay | Admitting: Psychology

## 2013-05-29 NOTE — Progress Notes (Signed)
    Daily Group Progress Note  Program: CD-IOP   Group Time: 1-2:30 pm  Participation Level: Active  Behavioral Response: Appropriate and Sharing  Type of Therapy: Process Group  Topic: Group Process; The first part of group was spent in process. Members shared about current issues and concerns. Two new group members were present and they shared a little about themselves. There was good disclosure and the new members welcomed by their new group members.  Group Time: 2:45- 4pm  Participation Level: Active  Behavioral Response: Sharing  Type of Therapy: Psycho-education Group  Topic: Psycho-Ed/Activity: first part of second half of group was spent discussing 'Denial". Included was a handout on this issue. Members shared reading the handout and provided examples of how they had sought or fell back into denial with families, friends, or themselves. In the second part of this session, members participated in "Family Sculpture". One member 'sculpted' her family and it proved very revealing and painful. Her Genogram was also drawn on the board and both she and her husband have the genetic predisposition on both sides. The session proved effective for all.   Summary: The patient reported he had attended 2 AA meetings since the last group session on Monday. He noted that when leaving the AA meeting last night, he had seen his ex-girlfriend drive by. They had spoken, but he admitted he has no intention of contacting her going forward. The patient reminded his fellow group members that he believes they are wise beyond words and beautiful in every way. The patient made some good comments and responded well to the fellow group member's sculpture. His sobriety date remains 10/1.   Family Program: Family present? No   Name of family member(s):   UDS collected: Yes Results: not back from lab  AA/NA attended?: [place] and Tuesday  Sponsor?: No, but he continues to seek out a  sponsor   Merriel Zinger, LCAS

## 2013-05-29 NOTE — Progress Notes (Signed)
    Daily Group Progress Note  Program: CD-IOP   Group Time: 1-2:30 pm  Participation Level: Active  Behavioral Response: Appropriate and Sharing  Type of Therapy: Process Group  Topic: Group members checked in by sharing their names and sobriety dates, and reported what they had done over the weekend to support their recovery.  Counselor also checked in about the psychoeducation activity from the previous group meeting, and several group members shared that the self-esteem exercise was difficult for them.  They agreed, however, that examining negative self-talk was important for their recovery.  Group members also discussed issues related to dealing with "drama" in the AA/NA community and offered support for one another.  Group Time: 2:45- 4pm  Participation Level: Active  Behavioral Response: Sharing  Type of Therapy: Psycho-education Group  Topic: Counselor provided a handout and information about dealing with difficult emotions and the common origins of those emotions.  Group members shared about some of the difficult feelings they had dealt with recently and discussed the different ways to handle their emotions.  Group members also practiced a HeartMath breathing exercise to reduce their stress, and discussed how they were affected by the exercise.  Several group members reported feeling more calm and relaxed after doing HeartMath.  Summary: .   Patient reported a sobriety date of 10/1 and shared that he had attended two meetings over the weekend.  One meeting was a speaker meeting, and he especially enjoyed hearing from the two speakers who shared.  Patient also reported feeling hurt by a comment that his mother made recently.  She does not know about his alcohol use problems, but asked him if he had been drinking.  Patient shared that this was hurtful and offensive to him because drinking is against his family's religious beliefs.  He also expressed gratitude to the other group  members, particularly the group member who wrote positive comments about him during the previous meeting, and thanked them for their support.  Family Program: Family present? No   Name of family member(s):   UDS collected: No Results:   AA/NA attended?: YesSaturday and Sunday  Sponsor?: No   Loys Hoselton, LCAS

## 2013-05-30 ENCOUNTER — Other Ambulatory Visit (HOSPITAL_COMMUNITY): Payer: Managed Care, Other (non HMO) | Admitting: Psychology

## 2013-05-30 DIAGNOSIS — F1020 Alcohol dependence, uncomplicated: Secondary | ICD-10-CM

## 2013-05-30 DIAGNOSIS — I10 Essential (primary) hypertension: Secondary | ICD-10-CM

## 2013-06-02 ENCOUNTER — Other Ambulatory Visit (HOSPITAL_COMMUNITY): Payer: Managed Care, Other (non HMO) | Admitting: Psychology

## 2013-06-02 ENCOUNTER — Encounter (HOSPITAL_COMMUNITY): Payer: Self-pay | Admitting: Psychology

## 2013-06-02 DIAGNOSIS — F1020 Alcohol dependence, uncomplicated: Secondary | ICD-10-CM | POA: Diagnosis not present

## 2013-06-02 DIAGNOSIS — F10239 Alcohol dependence with withdrawal, unspecified: Secondary | ICD-10-CM
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
## 2013-06-03 ENCOUNTER — Other Ambulatory Visit (HOSPITAL_COMMUNITY): Payer: Self-pay | Admitting: Physician Assistant

## 2013-06-03 NOTE — Progress Notes (Signed)
    Daily Group Progress Note  Program: CD-IOP   Group Time: 1:00-2:30pm  Participation Level: Active  Behavioral Response: Appropriate  Type of Therapy: Process Group  Topic: Counselor opened group by checking in with each group member.  Group members shared about what they had done over the weekend and then discussed different issues that were concerning them, including romantic relationships and developing coping skills to avoid relapse.  Counselor reviewed the 4-step relapse process and group members brainstormed different ways to distract themselves from thoughts of using.  One group member disclosed a relapse over the weekend and the group helped him process what triggered his relapse.       Group Time: 2:45-4:00pm  Participation Level: Minimal  Behavioral Response: Appropriate  Type of Therapy: Psycho-education Group  Topic: Counselors presented information on the concept of mindfulness, including the COAL acronym (curiosity, openness, acceptance, love).  They also shared 6 different short mindfulness exercises, tried one with the group, and encouraged group members to try others before the next group meeting.  Group members shared about their experiences with mindfulness, and their fears about becoming more mindful.  One group member in particular expressed concern that sitting still and being mindful would cause a flood of emotions.  Group members recognized the importance of using mindfulness to become more aware of their feelings and everyday experiences.   Summary: Patient reported a sobriety date of 10/1 and shared that he spent his weekend running errands, attending AA meetings, and participating in a Civil War reenactment.  During the psychoeducation activity on mindfulness, patient expressed interest in a mindful listening activity and shared that he enjoys getting lost in music.     Family Program: Family present? No   Name of family member(s):   UDS collected:  No Results: None  AA/NA attended?: Yes Sunday  Sponsor?: No   [facility]

## 2013-06-03 NOTE — Progress Notes (Unsigned)
Patient ID: Ronald Zhang, male   DOB: 10-02-1955, 57 y.o.   MRN: [id number] CD-IOP: Individual therapy session. Met with the patient at the conclusion of his group session today. I complimented him on his disclosures and feedback during the session. He reported he really enjoyed the group and had learned a lot since first entering the program. The patient appears to be making excellent progress in his recovery and his sobriety date remains 10/1. Random drug tests support his reported sobriety date and he admitted he is very pleased with himself. The patient has also embraced the 12-step community and is attending meetings daily. He mentioned "90 in 42" and admitted he had missed a few, but overall he is doing extremely well in meeting this second treatment goal. The third treatment goal is to return to work and assume his full-time responsibilities at the conclusion of the program,. He admitted he is looking forward to returning to work, but this time off ahs given him good insight about his life and work. The patient continues to make good progress in his recovery and in his treatment here in the CD-IOP. WE will continue to follow closely in the days ahead.

## 2013-06-04 ENCOUNTER — Other Ambulatory Visit (HOSPITAL_COMMUNITY): Payer: Self-pay | Admitting: Physician Assistant

## 2013-06-04 ENCOUNTER — Other Ambulatory Visit (HOSPITAL_COMMUNITY): Payer: Managed Care, Other (non HMO) | Admitting: Psychology

## 2013-06-04 DIAGNOSIS — I10 Essential (primary) hypertension: Secondary | ICD-10-CM

## 2013-06-04 DIAGNOSIS — F1020 Alcohol dependence, uncomplicated: Secondary | ICD-10-CM

## 2013-06-06 ENCOUNTER — Other Ambulatory Visit (HOSPITAL_COMMUNITY): Payer: Self-pay | Admitting: Physician Assistant

## 2013-06-06 ENCOUNTER — Other Ambulatory Visit (HOSPITAL_COMMUNITY): Payer: Managed Care, Other (non HMO) | Admitting: Psychology

## 2013-06-06 DIAGNOSIS — F1020 Alcohol dependence, uncomplicated: Secondary | ICD-10-CM | POA: Diagnosis not present

## 2013-06-06 DIAGNOSIS — I10 Essential (primary) hypertension: Secondary | ICD-10-CM

## 2013-06-07 ENCOUNTER — Encounter (HOSPITAL_COMMUNITY): Payer: Self-pay | Admitting: Psychology

## 2013-06-07 NOTE — Progress Notes (Signed)
    Daily Group Progress Note  Program: CD-IOP   Group Time: 1-2:30 pm  Participation Level: Active  Behavioral Response: Appropriate and Sharing  Type of Therapy: Activity Group  Topic:Mindfulness: first part of group was spent in an activity practicing mindfulness. Saltine crackers were provided to each group member and they were instructed to eat the cracker as slowly as possible and watch themselves and experience the taste, texture and entire cracker. Most members admitted they eat really fast - essentially, inhaling their food. Later in this session, the group practiced mindfulness with a walk around [place]. Again, members admitted they felt awkward walking so slowly, but agreed that by walking so much slower than usual, they really "saw the trees" in their rich fall colors and were more aware of the sound of the wind blowing through the branches and leaves.    Group Time: 2:45-4pm  Participation Level: Active  Behavioral Response: Sharing  Type of Therapy: Psycho-education Group  Topic: Family Sculpture/Graduation: Members were invited to 'sculpt' their families in this second half of group group. Three members engaged in this psycho-ed session and by sharing about their families, they revealed much more about themselves to the rest of the group. The session proved very compelling. It ended with a graduation for a departing member. He thanked the group and admitted he wished he had been more open from the beginning and hared more of himself. He expressed his gratitude and hopes for his fellow group members. Group members responded very positively and almost everyone noted they had seen a big change in him from the first day he entered the program. It was very powerful and heartfelt farewell.    Summary: the patient reported he had attended 2 AA meetings since the last group session. He noted he would be late to group on Friday because he was going to a noon AA meeting  on 'Steps 1, 2, & 3". He enjoyed the mindfulness exercises and was able to eat the cracker almost slower than anybody else in group. In the family sculpture, the patient served as a family member in one of the sculptures and provided good feedback in the session. He shared kind words with the graduating member and wished him well. thsi patient continues to be a great group member with his honest, but gentle manner. His sobriety date remains 10/1 and he noted he will pick up his 30 day chip tomorrow.    Family Program: Family present? No   Name of family member(s):   UDS collected: Yes Results:not back from lab  AA/NA attended?: [place] and Tuesday  Sponsor?: No, but he will ask someone very soon   Minard Millirons, LCAS

## 2013-06-09 ENCOUNTER — Other Ambulatory Visit (HOSPITAL_COMMUNITY): Payer: Managed Care, Other (non HMO) | Attending: Psychiatry | Admitting: Psychology

## 2013-06-09 DIAGNOSIS — I10 Essential (primary) hypertension: Secondary | ICD-10-CM

## 2013-06-09 DIAGNOSIS — F1020 Alcohol dependence, uncomplicated: Secondary | ICD-10-CM

## 2013-06-09 DIAGNOSIS — F1010 Alcohol abuse, uncomplicated: Secondary | ICD-10-CM | POA: Insufficient documentation

## 2013-06-10 ENCOUNTER — Encounter (HOSPITAL_COMMUNITY): Payer: Self-pay | Admitting: Psychology

## 2013-06-10 NOTE — Progress Notes (Signed)
    Daily Group Progress Note  Program: CD-IOP   Group Time: 1-2:30 pm  Participation Level: Minimal  Behavioral Response: Sharing  Type of Therapy: Process Group  Topic: Members introduced themselves and shared their sobriety dates, then shared one thing they had done since the previous group to support their recovery.  Counselor led group members in 5 minutes of Heart Math before starting the process time.  Two new group members and one  more established group member shared their stories about what brought them to the group, as well as what they wanted to gain from being a part of the program.  Other group members connected with them by pointing out things they had in common and providing encouragement.  At the end of group, counselor asked all group members to share their weekend plans to insure that everyone had safe activities to engage in over the holiday weekend.  Group Time: 2:45- 4pm  Participation Level: Minimal  Behavioral Response: Sharing  Type of Therapy: Psycho-education Group  Topic: Counselor provided a handout with information on the progression of relapse and early warning signs, highlighting that the decision to use drugs or drink is the completion of the relapse process, not the beginning.  Group members discussed changes in thoughts/attitudes, feelings/moods, and behaviors that might indicate movement toward relapse, and shared about their personal warning signs.  Group members also completed a "green light/yellow light/red light" activity, in which they discussed behaviors that indicate health and well-being (green light), behaviors that lead directly to relapse (red light), and warning sign behaviors that indicate movement toward relapse (yellow light).  They discussed the importance of recognizing the "yellow light" signs early and moving back toward "green light" behaviors.  Summary: .  Patient arrived to group late, and provided the signed documentation from the  AA meeting that he attended before group, as he had discussed previously with counselor. He indicated a sobriety date of 10/1 and shared that he had received his 30 day chip at the meeting he attended.  He expressed pride in himself as the group congratulated him for this accomplishment.  During the psychoeducation group, he shared that some of his "green light" behaviors are attending meetings and coming to group and individual counseling. The patient continues to make good progress in his recovery and has come a long way since he first entered the program.    Family Program: Family present? No   Name of family member(s):   UDS collected: No Results:   AA/NA attended?: YesWednesday, Thursday and Friday  Sponsor?: No   Mikell Kazlauskas, LCAS

## 2013-06-11 ENCOUNTER — Other Ambulatory Visit (HOSPITAL_COMMUNITY): Payer: Managed Care, Other (non HMO) | Admitting: Psychology

## 2013-06-11 ENCOUNTER — Encounter (HOSPITAL_COMMUNITY): Payer: Self-pay | Admitting: Psychology

## 2013-06-11 DIAGNOSIS — I10 Essential (primary) hypertension: Secondary | ICD-10-CM

## 2013-06-11 DIAGNOSIS — F1020 Alcohol dependence, uncomplicated: Secondary | ICD-10-CM

## 2013-06-12 ENCOUNTER — Encounter (HOSPITAL_COMMUNITY): Payer: Self-pay | Admitting: Psychology

## 2013-06-12 NOTE — Progress Notes (Unsigned)
Patient ID: Ronald Zhang, male   DOB: 03-04-1956, 57 y.o.   MRN: [id number] CD-IOP: Treatment Plan Update. Met with the patient this afternoon upon conclusion of his group session. I applauded him for his engagement in the group and how well he has done in his treatment here. The patient reminded me that he has really enjoyed the group and agreed that he has met many wonderful people since entering treatment here as well as people he has met in the [employer] in [place]. We discussed his treatment goals and he has done very well in remaining abstinent and his sobriety date is 10/1. He has attended AA meetings almost every day since he first began the program and he has exceeded our expectations greatly. The enthusiasm and sincerity he has displayed has been incredible and he has been touched by all of the people he has met. His third treatment goal is to return to work. I assured him that if he continues as he is currently doing, he will be cleared to work in about a month. The patient noted he has really enjoyed the group and looks forward to our sessions. He is taking his medications as prescribed and noted that he doesn't have any more restless leg problems since he began taking the Trazadone. He is sleeping really well for the first time in a very long time. The documentation was reviewed, signatures collected, and the update completed accordingly. We will continue to follow closely in the days ahead.

## 2013-06-12 NOTE — Progress Notes (Signed)
    Daily Group Progress Note  Program: CD-IOP   Group Time: 1-2:30 pm  Participation Level: Active  Behavioral Response: Appropriate and Sharing  Type of Therapy: Process Group  Topic: Group Process: The first part of group was spent in process. Members shared about current issues and concerns. One member talked about her 'ex' and how he is still phoning her. Most of the group wanted to know why she is even still talking to him. Members provided good feedback to this group member as she struggles with her codependency issues. There were excellent questions, suggestions and good insight displayed by group members. The patient was receptive to their comments.   Group Time: *2:45- 4pm  Participation Level: Active  Behavioral Response: Sharing  Type of Therapy: Psycho-education Group  Topic: "Pros and Cons": The second half of group was spent in a psycho-ed presentation. Members were asked to identify the pros or benefits of active addiction in addition to the negatives or cons. They were also challenged to identify the pros and cons of sobriety. This proved to be a very engaging exercise and members were able to identify many pros and cons of sobriety as well as addiction. One member pointed out that some of the pros were also cons at the same time. This psycho-ed invited reflection and challenged members to identify what they were really getting from their drug use and how their lives might look without the alcohol and drugs. There was very good engagement among everyone in the group.    Summary: The patient reported he was doing well. He attended 2 AA meetings since the last group session and continues to meet new people. The patient noted that one man he met at an AA meeting only lives about one block from his home, but he had never seen him before. The patient provided good support to the member struggling and his feedback was well-received. In part two of group, the patient reported  that he could remember things much better in sobriety and was much more reliable. The patient continues to make good progress and his sobriety date remains 10/1.    Family Program: Family present? No   Name of family member(s):   UDS collected: No Results:   AA/NA attended?: [place] and Tuesday  Sponsor?: No, but he is going to ask a fellow later this evening.    Tymber Stallings, LCAS

## 2013-06-13 ENCOUNTER — Other Ambulatory Visit (HOSPITAL_COMMUNITY): Payer: Managed Care, Other (non HMO) | Admitting: Psychology

## 2013-06-13 DIAGNOSIS — F1020 Alcohol dependence, uncomplicated: Secondary | ICD-10-CM

## 2013-06-13 DIAGNOSIS — I10 Essential (primary) hypertension: Secondary | ICD-10-CM

## 2013-06-16 ENCOUNTER — Other Ambulatory Visit (HOSPITAL_COMMUNITY): Payer: Managed Care, Other (non HMO) | Admitting: Psychology

## 2013-06-16 ENCOUNTER — Encounter (HOSPITAL_COMMUNITY): Payer: Self-pay | Admitting: Psychology

## 2013-06-16 DIAGNOSIS — F1020 Alcohol dependence, uncomplicated: Secondary | ICD-10-CM

## 2013-06-16 DIAGNOSIS — I10 Essential (primary) hypertension: Secondary | ICD-10-CM

## 2013-06-16 NOTE — Progress Notes (Signed)
    Daily Group Progress Note  Program: CD-IOP   Group Time: 1-2:30 pm  Participation Level: Active  Behavioral Response: Sharing  Type of Therapy: Process Group  Topic: Group process. The first part of group was spent in process. Members shared about the past weekend and any issues, temptations, or concerns they might have had. This past weekend included Halloween and members had plenty to share. There was good disclosure and feedback among group members. Also present today were two students from the [facility] Program at [place].   Group Time: 2:45- 4pm  Participation Level: Minimal  Behavioral Response: Sharing  Type of Therapy: Psycho-education Group  Topic: Boundaries: The second half of group consisted of a psycho-ed on boundaries. A handout was provided on establishing boundaries - both outside one's self and within as well. Almost all group members have very poor boundaries and long term sobriety will require firm boundaries for these folks. The session proved lively with good comments and discussion among members.   Summary: The patient reported he had had a good weekend. He had attended 3 AA meetings since the last group session and he expressed surprise that I had not been in group on Friday. I applauded the news that he had picked up a 30-day chip late last week and the group applauded with me. The patient provided good support and feedback to his fellow group members. He did receive some challenging questions from other group members. He had spoken of "you folks" as if the other group members were different than himself and a group member had challenged him on this. She wondered whether he felt like he didn't belong in this group and whether he thought he was 'different'. The patient shared little of himself in the second half of group. Despite his lack of engagement, the patient made some excellent comments and continues to make good progress in his treatment here. The patient's  sobriety date remains 10/1.   Family Program: Family present? No   Name of family member(s):   UDS collected: No Results:   AA/NA attended?: YesFriday, Saturday and Sunday  Sponsor?: No, but he intends to ask someone in his AA homegroup   Tressy Kunzman, LCAS

## 2013-06-17 ENCOUNTER — Encounter (HOSPITAL_COMMUNITY): Payer: Self-pay | Admitting: Psychology
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
## 2013-06-18 ENCOUNTER — Other Ambulatory Visit (HOSPITAL_COMMUNITY): Payer: Managed Care, Other (non HMO) | Admitting: Psychology

## 2013-06-18 DIAGNOSIS — I10 Essential (primary) hypertension: Secondary | ICD-10-CM

## 2013-06-18 DIAGNOSIS — F1020 Alcohol dependence, uncomplicated: Secondary | ICD-10-CM

## 2013-06-19 ENCOUNTER — Encounter (HOSPITAL_COMMUNITY): Payer: Self-pay | Admitting: Psychology

## 2013-06-19 NOTE — Progress Notes (Signed)
    Daily Group Progress Note  Program: CD-IOP   Group Time: 1-2:30 pm  Participation Level: Minimal  Behavioral Response: Sharing  Type of Therapy: Psycho-education Group  Topic:Psycho-Ed: The first part of group as spent in a visit from the Chaplain. Ronald Zhang appeared for group and introduced the topic of 'Forgiveness'. The chaplain agreed that this is a very difficult issue and one that many struggle with. When members were invited to share their feeling, there were many interpretations about forgiveness and the difficulties they have had with it. The chaplain challenged members to identify what they were feeling 'right now'. The discussion was intense and very revealing. The group responded well to this entire experience.   Group Time: 2:45- 4pm  Participation Level: Active  Behavioral Response: Appropriate and Sharing  Type of Therapy: Process Group  Topic: Group Process/Graduation: The second half of group was spent in process. Members shared about their issues and concerns in early recovery. I challenged one member who had tried to avoid coming to group today. His mother had contacted me and she had brought him. The patient admitted he had been craving and wanted to stay out today and smoke a rock. This disclosure generated more conversation about cravings and relapse prevention strategies. As the session neared the end, a graduation ceremony was held honoring a departing member. There were kind words of hope and encouragement shared and the session proved effective for all present.   Summary: The patient was attentive and engaged in group today. He made some observations and expressed some frustration over an event that happened yesterday, but denied he was angry or seeking any sort of revenge because of the incident. In process, the patient reported he sees his sponsor almost everyday in meetings and he will continue to attend AA meetings after this program is  completed. He shared some kind words with the graduating member, who he has great respect and admiration for and wished him well. The patient made some good comments and responded well to this intervention. His sobriety date is 10/1.    Family Program: Family present? No   Name of family member(s):   UDS collected: No Results:  AA/NA attended?: YesMonday and Tuesday  Sponsor?: Yes   Ronald Zhang, LCAS

## 2013-06-20 ENCOUNTER — Other Ambulatory Visit (HOSPITAL_COMMUNITY): Payer: Managed Care, Other (non HMO) | Admitting: Psychology

## 2013-06-20 DIAGNOSIS — F1020 Alcohol dependence, uncomplicated: Secondary | ICD-10-CM

## 2013-06-20 DIAGNOSIS — I10 Essential (primary) hypertension: Secondary | ICD-10-CM

## 2013-06-22 ENCOUNTER — Encounter (HOSPITAL_COMMUNITY): Payer: Self-pay | Admitting: Psychology

## 2013-06-22 NOTE — Progress Notes (Signed)
    Daily Group Progress Note  Program: CD-IOP   Group Time: 1-2:30 pm  Participation Level: Active  Behavioral Response: Sharing  Type of Therapy: Process Group  Topic: Group Process; the first part of group was spent in process. Members shared about the past weekend while three new group members introduced themselves. No one had relapsed since the last group session, despite struggles and frustrations over the weekend. The new members received good feedback and seemed relaxed and comfortable in their first group session.  Group Time: 2:45-4pm  Participation Level: Minimal  Behavioral Response: Sharing  Type of Therapy: Psycho-education Group  Topic: Psycho-Ed: the second half of group was spent in a psycho-ed session. The 'Recovery Pie" was introduced/reviewed with members identifying the 8 primary elements of early recovery. Later in the session, the goals of group therapy were reviewed and members shared how 'cathartic' sharing a part of themselves have been. There was good disclosure and feedback among members, new and old.   Summary: The patient shared he had had a good weekend. He had attended 4 meetings since he left the Friday group session. The group applauded this dedication. He had even traveled to [place] and attended the [employer] at the [employer] on Saturday at 9:30 am. In the second half of group, the patient requested that since there were 4 men in group he be allowed to 'sculpt' his biological family? The patient was allowed to sculpt his family when he was about the age of 8. He basically 'sculpted' his failure to acknowledge and support his younger brother. This patient continues to 'express his emotions" most effectively through 'sculpting' his experiences. He provided good feedback to his fellow group members and responded well to this intervention. The patient's sobriety date remains 10/1.    Family Program: Family present? No   Name of family  member(s):   UDS collected: Yes Results: negative  AA/NA attended?: YesFriday, Saturday and Sunday  Sponsor?: Yes   Hiep Ollis, LCAS

## 2013-06-23 ENCOUNTER — Other Ambulatory Visit (HOSPITAL_COMMUNITY): Payer: Managed Care, Other (non HMO) | Admitting: Psychology

## 2013-06-23 ENCOUNTER — Encounter (HOSPITAL_COMMUNITY): Payer: Self-pay | Admitting: Psychology

## 2013-06-23 DIAGNOSIS — F1020 Alcohol dependence, uncomplicated: Secondary | ICD-10-CM

## 2013-06-23 DIAGNOSIS — I10 Essential (primary) hypertension: Secondary | ICD-10-CM

## 2013-06-23 NOTE — Progress Notes (Signed)
    Daily Group Progress Note  Program: CD-IOP   Group Time: 1-2:30 pm  Participation Level: Active  Behavioral Response: Appropriate and Sharing  Type of Therapy: Process Group  Topic: :  Group members introduced themselves and provided their sobriety dates.  Each group member had a chance to check in about how they were doing.  One group member reported a new sobriety date, and the group processed his relapse, provided him with encouragement, and commended his honesty.  Another group member shared that she was struggling with cravings, and the group provided her with suggestions about how to handle them.  The group also briefly discussed the supports worksheet from the previous session.  Group Time: 2:45- 4pm  Participation Level: Minimal  Behavioral Response: attentive, but rather quiet  Type of Therapy: Psycho-education Group  Topic: A pharmacist from the inpatient unit visited the group and provided education about psychotropic medications and how they can help people with substance use disorders.  She explained the effects of drug use on the central nervous system and described the healing process the brain goes through during early recovery.  She described several different types of psychotropic medications and provided the group with ideas about alternatives to addictive drugs such as benzodiazepines and opiates.  Group members asked questions about their own medications.  Group members reported after her presentation that they had learned a lot and enjoyed hearing from her.   Summary: Patient reported a sobriety date of 10/1. He shared that he had attended 2 meetings since the last session, called his sponsor, and had volunteered at the prison again.  He continues to experience no cravings for alcohol.  When other group members shared about living with their parents, he shared that his mother had been frustrating to him recently.  He reported that he was looking forward to  graduating from the program next week.  During the presentation from the pharmacist, he asked a few questions about his medications and received some advice from her.  Patient continues to do well with the program, attending AA meetings regularly, and making connections within the recovery community. This intervention proved effective for him.    Family Program: Family present? No   Name of family member(s):   UDS collected: No Results:  AA/NA attended?: YesWednesday and Thursday  Sponsor?: Yes   Grethel Zenk, LCAS

## 2013-06-24 ENCOUNTER — Encounter (HOSPITAL_COMMUNITY): Payer: Self-pay | Admitting: Psychology

## 2013-06-24 ENCOUNTER — Other Ambulatory Visit (HOSPITAL_COMMUNITY): Payer: Self-pay | Admitting: Physician Assistant

## 2013-06-24 NOTE — Progress Notes (Signed)
    Daily Group Progress Note  Program: CD-IOP   Group Time: 1-2:30 pm  Participation Level: Active  Behavioral Response: Sharing  Type of Therapy: Process Group  Topic: Process: The first part of group was spent in process. Members shared about the past weekend and any events, people, or incidents that may have proven challenging or difficult in early recovery. One member described temptations on Friday night and she has finally come to realize that she is not able to be around alcohol or other drugs. Another member pointed out that this was Step One. Another member described a chain of events, including finding some cannabis and a small pipe in some rolled up socks. Later he was also tempted to see Xanax after an emotional meeting with a dying grandparent. The session proved compelling with power depth of disclosure among members.    Group Time: 2:45- 4pm  Participation Level: Minimal  Behavioral Response: Sharing  Type of Therapy: Psycho-education Group  Topic: Psycho-Ed: The second part of group was spent watching excerpts from a DVD entitled, "Drunks". They consisted of scenes from an AA meeting with a speaker and, subsequently, other AA members sharing their stories. The DVD was paused with members sharing their observations and insights into the characters in the film. As the second half began, one member, who was being discharged, decided to leave instead of staying for the remainder of the session and saying proper farewells to his fellow group members. He was angry and seemed unable to grasp the benefits of remaining for the entire session.   Summary: The patient reported he had had a good weekend. He had attended 3 AA meetings, gone to a [employer], and attended a [employer] on Sunday morning. He found a lot of commonalities with AA in the service. He provided good feedback to his fellow group members and made some good comments. He shared little  about the DVD, but followed up with validation for his fellow group members. The patient has made excellent progress and will be graduating from the program on Wednesday. His sobriety date remains 10/1.    Family Program: Family present? No   Name of family member(s):   UDS collected: No Results:   AA/NA attended?: YesFriday, Saturday and Sunday  Sponsor?: Yes   Dezaria Methot, LCAS

## 2013-06-25 ENCOUNTER — Other Ambulatory Visit (HOSPITAL_COMMUNITY): Payer: Managed Care, Other (non HMO)

## 2013-06-27 ENCOUNTER — Other Ambulatory Visit (HOSPITAL_COMMUNITY): Payer: Managed Care, Other (non HMO)

## 2013-06-30 ENCOUNTER — Other Ambulatory Visit (HOSPITAL_COMMUNITY): Payer: Managed Care, Other (non HMO)

## 2013-07-01 ENCOUNTER — Other Ambulatory Visit (HOSPITAL_COMMUNITY): Payer: Self-pay | Admitting: *Deleted

## 2013-07-01 MED ORDER — ROPINIROLE HCL 0.5 MG PO TABS: 0.5000 mg | ORAL_TABLET | Freq: Every day | ORAL | Status: DC

## 2013-07-01 NOTE — Telephone Encounter (Signed)
Faxed refill request for Ropinirole. Pharmacy requesting new dose.Per patient, takes 2 - 0.25mg  tablets at bedtime at [facility] [facility] [facility] direction.Pt recent in CDIOP.  Refill request reviewed with Dr. Lucianne Muss as [facility] has left practice. Per MD, may give 30 day supply. Patient is to follow up with provider he was referred to on discharge from CDIOP.  Discovered Dr. Lucianne Muss sent refill of  #90 of 0.25 strength to pharmacy on 06/26/13.     Contacted pharmacy with above information.Pharmacist, Homero Fellers states last 90 day RX given 05/07/13 with directions to take one per night.  Patient was told by provider in CDIOP program ([employer] [facility] ) to increase dose to 2 at night, so he has run out of medication.  Insurance will not fill RX sent 06/26/13 until 08/07/13, as it is same directions as RX 05/07/13-- and would be too soon.  Per pharmacist, requires new prescription with new dose to be able to fill now. Advised pharmacist that per MD, may give 30 day supply at new dose. Pharmacist requests RX with new directions be sent.

## 2013-07-02 ENCOUNTER — Other Ambulatory Visit (HOSPITAL_COMMUNITY): Payer: Managed Care, Other (non HMO)

## 2013-07-04 ENCOUNTER — Other Ambulatory Visit (HOSPITAL_COMMUNITY): Payer: Managed Care, Other (non HMO)

## 2013-07-07 ENCOUNTER — Other Ambulatory Visit (HOSPITAL_COMMUNITY): Payer: Managed Care, Other (non HMO) | Attending: Psychiatry

## 2013-07-09 ENCOUNTER — Other Ambulatory Visit (HOSPITAL_COMMUNITY): Payer: Managed Care, Other (non HMO)

## 2013-07-25 ENCOUNTER — Other Ambulatory Visit (HOSPITAL_COMMUNITY): Payer: Self-pay | Admitting: Psychiatry

## 2013-07-28 NOTE — Telephone Encounter (Signed)
Chart reviewed. Refill authorized per Dr. Lucianne Muss.

## 2013-07-30 ENCOUNTER — Other Ambulatory Visit (HOSPITAL_COMMUNITY): Payer: Self-pay | Admitting: Psychiatry

## 2013-07-31 ENCOUNTER — Other Ambulatory Visit (HOSPITAL_COMMUNITY): Payer: Self-pay | Admitting: Psychiatry

## 2013-08-27 ENCOUNTER — Other Ambulatory Visit (HOSPITAL_COMMUNITY): Payer: Self-pay | Admitting: Psychiatry

## 2013-08-29 ENCOUNTER — Other Ambulatory Visit (HOSPITAL_COMMUNITY): Payer: Self-pay | Admitting: Psychiatry

## 2013-09-02 ENCOUNTER — Other Ambulatory Visit (HOSPITAL_COMMUNITY): Payer: Self-pay | Admitting: Psychiatry

## 2013-09-09 ENCOUNTER — Other Ambulatory Visit (HOSPITAL_COMMUNITY): Payer: Self-pay | Admitting: Psychiatry

## 2013-09-16 ENCOUNTER — Encounter: Payer: Self-pay | Admitting: Psychiatry

## 2013-10-11 ENCOUNTER — Ambulatory Visit: Payer: Managed Care, Other (non HMO) | Admitting: Family Medicine

## 2013-10-11 VITALS — BP 122/68 | HR 81 | Temp 98.2°F | Resp 17 | Ht 76.0 in | Wt 204.0 lb

## 2013-10-11 DIAGNOSIS — K862 Cyst of pancreas: Secondary | ICD-10-CM

## 2013-10-11 DIAGNOSIS — R42 Dizziness and giddiness: Secondary | ICD-10-CM

## 2013-10-11 DIAGNOSIS — K863 Pseudocyst of pancreas: Secondary | ICD-10-CM

## 2013-10-11 DIAGNOSIS — R040 Epistaxis: Secondary | ICD-10-CM

## 2013-10-11 DIAGNOSIS — I10 Essential (primary) hypertension: Secondary | ICD-10-CM

## 2013-10-11 DIAGNOSIS — M25562 Pain in left knee: Secondary | ICD-10-CM

## 2013-10-11 DIAGNOSIS — M25561 Pain in right knee: Secondary | ICD-10-CM

## 2013-10-11 DIAGNOSIS — R21 Rash and other nonspecific skin eruption: Secondary | ICD-10-CM

## 2013-10-11 DIAGNOSIS — K922 Gastrointestinal hemorrhage, unspecified: Secondary | ICD-10-CM

## 2013-10-11 DIAGNOSIS — M25569 Pain in unspecified knee: Secondary | ICD-10-CM

## 2013-10-11 DIAGNOSIS — D649 Anemia, unspecified: Secondary | ICD-10-CM

## 2013-10-11 LAB — PROTIME-INR
INR: 1.32 (ref <1.50)
Prothrombin Time: 16.2 seconds — ABNORMAL HIGH (ref 11.6–15.2)

## 2013-10-11 LAB — POCT CBC
Granulocyte percent: 66.8 %G (ref 37–80)
HCT, POC: 28.2 % — AB (ref 43.5–53.7)
Hemoglobin: 8.4 g/dL — AB (ref 14.1–18.1)
Lymph, poc: 1 (ref 0.6–3.4)
MCH, POC: 26.1 pg — AB (ref 27–31.2)
MCHC: 29.8 g/dL — AB (ref 31.8–35.4)
MCV: 87.7 fL (ref 80–97)
MID (cbc): 0.4 (ref 0–0.9)
MPV: 10.1 fL (ref 0–99.8)
POC Granulocyte: 2.8 (ref 2–6.9)
POC LYMPH PERCENT: 23 % (ref 10–50)
POC MID %: 10.2 %M (ref 0–12)
Platelet Count, POC: 105 10*3/uL — AB (ref 142–424)
RBC: 3.22 M/uL — AB (ref 4.69–6.13)
RDW, POC: 16.3 %
WBC: 4.2 10*3/uL — AB (ref 4.6–10.2)

## 2013-10-11 LAB — COMPREHENSIVE METABOLIC PANEL
ALT: 13 U/L (ref 0–53)
Albumin: 4 g/dL (ref 3.5–5.2)
CO2: 22 mEq/L (ref 19–32)
Calcium: 9.6 mg/dL (ref 8.4–10.5)
Chloride: 109 mEq/L (ref 96–112)
Creat: 1.85 mg/dL — ABNORMAL HIGH (ref 0.50–1.35)
Potassium: 4.4 mEq/L (ref 3.5–5.3)
Total Protein: 7.1 g/dL (ref 6.0–8.3)

## 2013-10-11 LAB — COMPREHENSIVE METABOLIC PANEL WITH GFR
AST: 18 U/L (ref 0–37)
Alkaline Phosphatase: 101 U/L (ref 39–117)
BUN: 35 mg/dL — ABNORMAL HIGH (ref 6–23)
Glucose, Bld: 89 mg/dL (ref 70–99)
Sodium: 142 meq/L (ref 135–145)
Total Bilirubin: 1.1 mg/dL (ref 0.2–1.2)

## 2013-10-11 MED ORDER — TRIAMCINOLONE ACETONIDE 0.1 % EX CREA: 1.0000 "application " | TOPICAL_CREAM | Freq: Two times a day (BID) | CUTANEOUS | Status: DC | PRN

## 2013-10-11 MED ORDER — ROPINIROLE HCL 1 MG PO TABS: ORAL_TABLET | ORAL | Status: DC

## 2013-10-11 MED ORDER — LIDOCAINE 5 % EX PTCH
1.0000 | MEDICATED_PATCH | CUTANEOUS | Status: DC
Start: 2013-10-11 — End: 2014-10-06

## 2013-10-11 MED ORDER — TRAZODONE HCL 50 MG PO TABS
ORAL_TABLET | ORAL | Status: DC
Start: 2013-10-11 — End: 2013-11-09

## 2013-10-11 MED ORDER — LISINOPRIL-HYDROCHLOROTHIAZIDE 20-25 MG PO TABS: 1.0000 | ORAL_TABLET | Freq: Every day | ORAL | Status: DC

## 2013-10-11 NOTE — Progress Notes (Signed)
Chief Complaint:  Chief Complaint  Patient presents with  . Alcohol Problem    he quit   . Leg Injury    restless leg   . Medication Refill  . Sinusitis    bleeding in sinus per patient   . Knee Pain  . Elbow Pain  . Cerumen Impaction  . itching all over body    HPI: Ronald Zhang is a 58 y.o. male who is here for multiple things. He has been lost to follow-up because he has been in alcohol rehab, he was told by his job that if he did not quit drinking then he would lose his job. He stoped drinking on May 07, 2014. His issues today are many.  Sinus bleeding  Off and on x > 1 year. He has had some more increase sinus bleeding twice a day and someitmes it lasts 20 min. He does not bleed when he sleeps, He gets congested.So will blow his nose and  So sometimes picks at it. He has no easy briusising of the skin and there are no mucosal bleeding that is evident.  Chart review shows low plt count in the past. Platelets as low as 103. He is an alcoholic and I am not sure if he has liver cirrhosis.   Has not gotten MRI for pancreatic cyst since the last time we spoke due to cost. The [place] of abdomen that I took showed pancreatic cysts which the radiologist recommend f/u MRI. See report below:  IMPRESSION:  Multiple cystic foci identified within the pancreatic head and less  prominently in the pancreatic body. No discrete mass is identified  but further characterization is recommended with abdominal MRI with  contrast to evaluate for a cystic neoplastic process and determine  appropriate follow up.  Prominent liver size and splenomegaly with no worrisome focal  lesions seen in either organ. Question mild hepatic steatosis.  These findings can be further assessed at the time of MRI.  Question mild atheromatous change.  Incomplete gallbladder distention in an NPO patient with no focal  abnormality noted.  These results will be called to the ordering clinician or  representative by  the Radiologist Assistant, and communication  documented in the PACS Dashboard.  Positional Hypotension on HTN meds for pulm HTN based on TEE but since he stopped drinking his BP had been much better. He is also on Lasix for bilateral leg swelling. Originally was on 80 mg at time of discharge from hospital but since then he cuts it in half down to 40 mg daily . Due to orthopedic issues he   Last Colonoscopy 8 years ago, Never had EGD. Denies any rectal bleeding. Last IFOBT was negative in 09/2012. I have also done iron studies and he is low.     Past Medical History  Diagnosis Date  . Hypertension   . Allergy   . Pulmonary HTN     TEE on March 31,2014-mild-mod concentric hypertrophy.  EF 60-65%. Left atrial valve mildly dilated, right atrium mildly dilated, mild-moderate MR, mild TR. Pum Pressure 51 mm Hg  . Cellulitis and abscess of leg     Hospitalized at [facility] from 4/5-4/10/14 for bilateral [place] edema and cellulitis. Was on IV Lasix and Vancomycin.   . Alcohol dependency     stopped drinking since November 09, 2012, restarted drinking on 01/24/13  . Urinary incontinence    Past Surgical History  Procedure Laterality Date  . Fracture surgery Bilateral 1972  below knee   History   Social History  . Marital Status: Single    Spouse Name: N/A    Number of Children: N/A  . Years of Education: N/A   Social History Main Topics  . Smoking status: Never Smoker   . Smokeless tobacco: None  . Alcohol Use: 24.0 oz/week    40 Shots of liquor per week     Comment: pint a day, sober since 05/07/2013  . Drug Use: No  . Sexual Activity: No   Other Topics Concern  . None   Social History Narrative   05/07/2013 AHW "Ronald Zhang" was born and grew up in [place], [place]. He has 3 younger brothers. His parents are still together. He reports he had a good childhood. He graduated from high school. 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 He married once, and has  been divorced for 30 years. He has twins, a boy and girl. He denies any legal difficulties. His hobbies include family genealogy, studying Southern history, and participating in Civil War reenactments. 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 His social support system consists of friends. 05/07/2013 AHW   Family History  Problem Relation Age of Onset  . Hypertension Mother   . Hypertension Father   . Alcohol abuse Brother    Allergies  Allergen Reactions  . Penicillins Swelling    Childhood   Prior to Admission medications   Medication Sig Start Date End Date Taking? Authorizing Provider  furosemide (LASIX) 80 MG tablet Take 40 mg by mouth daily. Takes 1/2 tablet   Yes Historical Provider, MD  lisinopril-hydrochlorothiazide (PRINZIDE,ZESTORETIC) 20-25 MG per tablet Take 1 tablet by mouth daily.   Yes Historical Provider, MD  traZODone (DESYREL) 50 MG tablet TAKE 1 TABLET BY MOUTH EVERY NIGHT AT BEDTIME 08/27/13  Yes Nelly RoutArchana Kumar, MD     ROS: The patient denies fevers, chills, night sweats, unintentional weight loss, chest pain, palpitations, wheezing, dyspnea on exertion, nausea, vomiting, abdominal pain, dysuria, hematuria, melena, numbness, weakness, or tingling. + bleediing from nose  All other systems have been reviewed and were otherwise negative with the exception of those mentioned in the HPI and as above.    PHYSICAL EXAM: Filed Vitals:   10/11/13 0934  BP: 122/68  Pulse: 81  Temp: 98.2 F (36.8 C)  Resp: 17   Filed Vitals:   10/11/13 0934  Height: 6\' 4"  (1.93 m)  Weight: 204 lb (92.534 kg)   Body mass index is 24.84 kg/(m^2).  General: Alert, no acute distress HEENT:  Normocephalic, atraumatic, oropharynx patent. EOMI, PERRLA, no jaundice, no mucosal bleeding Cardiovascular:  Regular rate and rhythm, no rubs murmurs or gallops.  No Carotid bruits, radial pulse intact. No pedal edema.  Respiratory: Clear to auscultation bilaterally.  No wheezes, rales, or rhonchi.  No cyanosis,  no use of accessory musculature GI: No organomegaly, abdomen is soft and non-tender, positive bowel sounds.  No masses. + slight hepatomegaly Skin: + eczematous rashes. Neurologic: Facial musculature symmetric. Psychiatric: Patient is appropriate throughout our interaction. Lymphatic: No cervical lymphadenopathy Musculoskeletal: Gait intact but awkward. Very bowed legged, he has had ortho problems for a long time.    LABS: Results for orders placed in visit on 10/11/13  POCT CBC      Result Value Ref Range   WBC 4.2 (*) 4.6 - 10.2 K/uL   Lymph, poc 1.0  0.6 - 3.4   POC LYMPH PERCENT 23.0  10 - 50 %L   MID (cbc)  0.4  0 - 0.9   POC MID % 10.2  0 - 12 %M   POC Granulocyte 2.8  2 - 6.9   Granulocyte percent 66.8  37 - 80 %G   RBC 3.22 (*) 4.69 - 6.13 M/uL   Hemoglobin 8.4 (*) 14.1 - 18.1 g/dL   HCT, POC 14.7 (*) 82.9 - 53.7 %   MCV 87.7  80 - 97 fL   MCH, POC 26.1 (*) 27 - 31.2 pg   MCHC 29.8 (*) 31.8 - 35.4 g/dL   RDW, POC 56.2     Platelet Count, POC 105 (*) 142 - 424 K/uL   MPV 10.1  0 - 99.8 fL     EKG/XRAY:   Primary read interpreted by Dr. Conley Rolls at [facility].   ASSESSMENT/PLAN: Encounter Diagnoses  Name Primary?  . Epistaxis Yes  . HTN (hypertension)   . Pancreatic cyst   . Dizziness and giddiness   . Knee pain, bilateral   . GIB (gastrointestinal bleeding)   . Rash and nonspecific skin eruption    Mr Kriz is a pleasant 58 year old man who has HTN secondary to prior alcohol abuse  pulmonary HTN on TEE,  who is in [place]. He is doing well. This is the best that I have seen him. Epistaxis- Advise him to compress , ice and also stop picking at his nose for epistaxis, lubricate and humidfy Knee pain -can't take tylenol due to prior elevated LFTs from heavy alcohol use, can'tt take NSAIDs due to low Hgb and being a recent recovering alcoholic worrisome for GIB He denies seiziues or withdrawal sxs  But with prior alcohol dependency so will not give tramadol Will rx lidoderm  patch for knee pain HTN-Advise to take 1/2 tab of Lisinopril HCTZ 20/25 mg since he is anemic and hypotensive. LAsix he takes daily for chronic leg swelling  He will need MRI abdomen, he also needs to get a stat referral to GI  Due for anemia  And h/o alcohol abuse, advise  to take iron supplement Triamcinolone for eczematic rash on back F/u  1 month  More than 30 min face to face time spent with patient Gross sideeffects, risk and benefits, and alternatives of medications d/w patient. Patient is aware that all medications have potential sideeffects and we are unable to predict every sideeffect or drug-drug interaction that may occur.  Ronald Blatt PHUONG, DO 10/11/2013 1:18 PM

## 2013-10-16 ENCOUNTER — Encounter: Payer: Self-pay | Admitting: Family Medicine

## 2013-10-24 ENCOUNTER — Telehealth: Payer: Self-pay

## 2013-10-24 ENCOUNTER — Telehealth: Payer: Self-pay | Admitting: *Deleted

## 2013-10-24 NOTE — Telephone Encounter (Signed)
Telephone for Chip BoerVicki at [facility] Need Prior auth on MRI Pt has Ronald Auer[employer] and the Prior Auth from last year is not valid for this year.

## 2013-10-24 NOTE — Telephone Encounter (Signed)
PA needed for lidocaine patches. I have been working on this since 3/17 when first completed on covermymeds. Got fax from ins asking for meds pt has tried/failed in the past. LMOM for pt to CB. What meds has he used in the past besides Relafen and aspirin? PT IS NOT A CANDIDATE FOR NSAIDS OR TYLENOL PRODUCTS d/t other Dxs but I had already reported this on original PA form.

## 2013-10-27 ENCOUNTER — Inpatient Hospital Stay
Admission: RE | Admit: 2013-10-27 | Discharge: 2013-10-27 | Disposition: A | Discharge status: Home / Self Care | Payer: Self-pay | Source: Ambulatory Visit | Attending: Family Medicine | Admitting: Family Medicine

## 2013-10-27 NOTE — Telephone Encounter (Signed)
Pt called back and reported that he has tried Meloxicam and ibuprofen also in the past, but that Dr has instr'd not to take now d/t prob GI bleed. Completed new form and faxed in to inc.

## 2013-10-28 NOTE — Telephone Encounter (Signed)
PA approved through 10/26/16. Notified pharm and pt on VM.

## 2013-10-30 ENCOUNTER — Telehealth: Payer: Self-pay

## 2013-10-30 NOTE — Telephone Encounter (Signed)
Patient has some questions about Lidocaine. Patient wants to know if he can wear 2 patches at the same time instead of just 1.   [phone]
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
## 2013-10-31 NOTE — Telephone Encounter (Signed)
Spoke to pt, i explained to use as directed; only one patch.   He stated he has two knees and would like to know if he should be using one patch for each knee?  Please advise.

## 2013-10-31 NOTE — Telephone Encounter (Signed)
I would 1st suggest he cut a patch in half to see if that gives him enough pain relief and if it does not then he can use one on each leg for 12h a day.

## 2013-11-01 ENCOUNTER — Telehealth: Payer: Self-pay | Admitting: *Deleted

## 2013-11-01 DIAGNOSIS — D649 Anemia, unspecified: Secondary | ICD-10-CM

## 2013-11-01 DIAGNOSIS — F1011 Alcohol abuse, in remission: Secondary | ICD-10-CM

## 2013-11-01 NOTE — Telephone Encounter (Signed)
lmom to cb. 

## 2013-11-01 NOTE — Telephone Encounter (Signed)
Message copied by Blima LedgerICHARDSON, Permelia Bamba D on Sat Nov 01, 2013 11:48 AM ------      Message from: Lenell AntuLE, THAO P      Created: Sat Nov 01, 2013  7:31 AM       Tell him to come see us again today so we can repeat his blood work and for re-evaluation. Should not drive if he is having "white outs". He needs someone else to drive him. His last hb was 8 , platlets are low and such so that is why I sen him to GI. History of alcohol abuse.             He also needs to get that MRI scan for me.There were pancreatic cysts and he never got this done since last year for all sorts of reasons. When is he getting this done?             Tell him I am out of the office otherwise I would cal him myself if not for time change.  ------

## 2013-11-03 NOTE — Telephone Encounter (Signed)
Unable to LM. VM box full.

## 2013-11-04 NOTE — Telephone Encounter (Signed)
Unable to LM- tried father's number we have on file- no answer

## 2013-11-05 ENCOUNTER — Other Ambulatory Visit: Payer: Self-pay | Admitting: Gastroenterology

## 2013-11-05 NOTE — Addendum Note (Signed)
Addended byVida Rigger: Deegan Valentino on: 11/05/2013 10:22 AM   Modules accepted: Orders

## 2013-11-06 NOTE — Telephone Encounter (Signed)
Unable to reach letter sent

## 2013-11-09 ENCOUNTER — Other Ambulatory Visit: Payer: Self-pay | Admitting: Family Medicine

## 2013-11-11 NOTE — Addendum Note (Signed)
Addended by: Lenell AntuLE, Manya Balash P on: 11/11/2013 01:56 PM   Modules accepted: Orders

## 2013-11-11 NOTE — Telephone Encounter (Signed)
Spoke with patient , I called his cell phone. He is no longer having white outs since he stopped taking trazodone. HE was unable to get a ride to his colonoscopy with Dr Melony OverlyMacGood. He went to get MRI done but  Insurance had not approved it although referral had already sent him a letter to go there that day for his appt  according to patient. [place] in [place] and I will try to set him up with a GI doctor there, he does not want to see Dr Charm BargesButler but there is another GI doctor in town Dr Mesinheimer and I will also try to get him to get an MRI closer to [place]. Lidoderm patches are working well for his knee pain. He denies any rectal bleeding, hematuria, melena, Still has minimal nasal bleeding x 1 since last time I saw him since he took NSAID on that day. Advise him to not use NSAIDs/tylenol due to his medical issues. He denies CP/SOB/dizziness.

## 2013-11-12 ENCOUNTER — Ambulatory Visit (HOSPITAL_COMMUNITY)
Admission: RE | Admit: 2013-11-12 | Payer: Managed Care, Other (non HMO) | Source: Ambulatory Visit | Admitting: Gastroenterology

## 2013-11-12 ENCOUNTER — Encounter (HOSPITAL_COMMUNITY): Admission: RE | Payer: Self-pay | Source: Ambulatory Visit

## 2013-11-12 ENCOUNTER — Encounter: Payer: Self-pay | Admitting: Family Medicine

## 2013-11-12 SURGERY — COLONOSCOPY WITH PROPOFOL
Anesthesia: Monitor Anesthesia Care

## 2013-11-14 ENCOUNTER — Other Ambulatory Visit: Payer: Self-pay | Admitting: Family Medicine

## 2013-11-14 DIAGNOSIS — Z139 Encounter for screening, unspecified: Secondary | ICD-10-CM

## 2013-11-17 ENCOUNTER — Telehealth: Payer: Self-pay

## 2013-11-17 NOTE — Telephone Encounter (Signed)
Called pt. He is going to CB in the morning and make an appt to see Dr. Conley RollsLe on Fri morning

## 2013-11-17 NOTE — Telephone Encounter (Signed)
PT WOULD LIKE TO SPEAK WITH DR LE'S NURSE ABOUT SOME ISSUES, HE STATES IT IS SO MUCH HE NEED TO TALK ABOUT SO HE DIDN'T WANT TO GO INTO DETAILS WITH ME PLEASE CALL [phone]

## 2013-11-21 ENCOUNTER — Inpatient Hospital Stay: Admission: RE | Admit: 2013-11-21 | Payer: Self-pay | Source: Ambulatory Visit

## 2013-11-21 ENCOUNTER — Ambulatory Visit
Admission: RE | Admit: 2013-11-21 | Discharge: 2013-11-21 | Disposition: A | Discharge status: Home / Self Care | Payer: Managed Care, Other (non HMO) | Source: Ambulatory Visit | Attending: Family Medicine | Admitting: Family Medicine

## 2013-11-21 ENCOUNTER — Encounter: Payer: Self-pay | Admitting: Family Medicine

## 2013-11-21 ENCOUNTER — Other Ambulatory Visit: Payer: Self-pay

## 2013-11-21 ENCOUNTER — Ambulatory Visit (INDEPENDENT_AMBULATORY_CARE_PROVIDER_SITE_OTHER): Payer: Managed Care, Other (non HMO) | Admitting: Family Medicine

## 2013-11-21 VITALS — BP 104/53 | HR 76 | Temp 98.4°F | Resp 16 | Ht 74.75 in | Wt 205.0 lb

## 2013-11-21 DIAGNOSIS — R42 Dizziness and giddiness: Secondary | ICD-10-CM

## 2013-11-21 DIAGNOSIS — M25569 Pain in unspecified knee: Secondary | ICD-10-CM

## 2013-11-21 DIAGNOSIS — G8929 Other chronic pain: Secondary | ICD-10-CM

## 2013-11-21 DIAGNOSIS — D649 Anemia, unspecified: Secondary | ICD-10-CM

## 2013-11-21 DIAGNOSIS — R935 Abnormal findings on diagnostic imaging of other abdominal regions, including retroperitoneum: Secondary | ICD-10-CM

## 2013-11-21 DIAGNOSIS — Z139 Encounter for screening, unspecified: Secondary | ICD-10-CM

## 2013-11-21 DIAGNOSIS — I959 Hypotension, unspecified: Secondary | ICD-10-CM

## 2013-11-21 LAB — POCT CBC
Granulocyte percent: 70.1 %G (ref 37–80)
HCT, POC: 26.9 % — AB (ref 43.5–53.7)
Hemoglobin: 8.2 g/dL — AB (ref 14.1–18.1)
Lymph, poc: 1.2 (ref 0.6–3.4)
MCH: 25.8 pg — AB (ref 27–31.2)
MCHC: 30.5 g/dL — AB (ref 31.8–35.4)
MCV: 84.5 fL (ref 80–97)
MID (CBC): 0.4 (ref 0–0.9)
MPV: 9.6 fL (ref 0–99.8)
PLATELET COUNT, POC: 128 10*3/uL — AB (ref 142–424)
POC Granulocyte: 3.8 (ref 2–6.9)
POC LYMPH %: 21.7 % (ref 10–50)
POC MID %: 8.2 %M (ref 0–12)
RBC: 3.18 M/uL — AB (ref 4.69–6.13)
RDW, POC: 17.1 %
WBC: 5.4 10*3/uL (ref 4.6–10.2)

## 2013-11-21 LAB — COMPLETE METABOLIC PANEL WITH GFR
ALK PHOS: 108 U/L (ref 39–117)
ALT: 13 U/L (ref 0–53)
AST: 20 U/L (ref 0–37)
Albumin: 3.8 g/dL (ref 3.5–5.2)
BILIRUBIN TOTAL: 1 mg/dL (ref 0.2–1.2)
BUN: 33 mg/dL — AB (ref 6–23)
CALCIUM: 9.4 mg/dL (ref 8.4–10.5)
CO2: 21 mEq/L (ref 19–32)
Chloride: 106 mEq/L (ref 96–112)
Creat: 1.49 mg/dL — ABNORMAL HIGH (ref 0.50–1.35)
GFR, Est African American: 59 mL/min — ABNORMAL LOW
GFR, Est Non African American: 51 mL/min — ABNORMAL LOW
Glucose, Bld: 93 mg/dL (ref 70–99)
Potassium: 4.2 mEq/L (ref 3.5–5.3)
Sodium: 138 mEq/L (ref 135–145)
Total Protein: 6.3 g/dL (ref 6.0–8.3)

## 2013-11-21 LAB — GLUCOSE, POCT (MANUAL RESULT ENTRY): POC Glucose: 121 mg/dl — AB (ref 70–99)

## 2013-11-21 LAB — IFOBT (OCCULT BLOOD): IFOBT: NEGATIVE
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
## 2013-11-26 ENCOUNTER — Telehealth: Payer: Self-pay

## 2013-11-26 NOTE — Telephone Encounter (Signed)
PT WANT DR LE TO KNOW HE HAVE AN APPT FOR AN MRI ON 5/6 AT [facility]. IT SHOULD BE ABOUT 9:00 IN THE MORNING YOU MAY REACH PT AT [phone] IF NEEDED

## 2013-12-10 ENCOUNTER — Ambulatory Visit
Admission: RE | Admit: 2013-12-10 | Discharge: 2013-12-10 | Disposition: A | Discharge status: Home / Self Care | Payer: Managed Care, Other (non HMO) | Source: Ambulatory Visit | Attending: Family Medicine | Admitting: Family Medicine

## 2013-12-10 ENCOUNTER — Other Ambulatory Visit: Payer: Self-pay | Admitting: Family Medicine

## 2013-12-10 DIAGNOSIS — IMO0001 Reserved for inherently not codable concepts without codable children: Secondary | ICD-10-CM

## 2013-12-10 MED ORDER — GADOBENATE DIMEGLUMINE 529 MG/ML IV SOLN
10.0000 mL | Freq: Once | INTRAVENOUS | Status: AC | PRN
  Administered 2013-12-10: 10 mL via INTRAVENOUS

## 2013-12-11 ENCOUNTER — Encounter: Payer: Self-pay | Admitting: Family Medicine

## 2013-12-11 ENCOUNTER — Telehealth: Payer: Self-pay | Admitting: Family Medicine

## 2013-12-11 NOTE — Telephone Encounter (Signed)
Spoke with patient about MRI results, he will make appt with Dr [place]an [place]issenger [place].  Will send his MRI rsults to him, advise that he d/w GI his MRI results prior to colonoscopy.

## 2013-12-14 ENCOUNTER — Other Ambulatory Visit: Payer: Self-pay | Admitting: Physician Assistant

## 2013-12-14 ENCOUNTER — Other Ambulatory Visit: Payer: Self-pay | Admitting: Family Medicine

## 2013-12-15 NOTE — Telephone Encounter (Signed)
Dr Conley RollsLe do you want to give RFs on this med? Saw pt recently but not for this med.

## 2013-12-15 NOTE — Telephone Encounter (Signed)
Dr Conley RollsLe, do you want to give RFs? You saw pt in Apr. But not for this med.

## 2014-01-23 ENCOUNTER — Encounter: Payer: Self-pay | Admitting: Family Medicine

## 2014-01-23 ENCOUNTER — Ambulatory Visit (INDEPENDENT_AMBULATORY_CARE_PROVIDER_SITE_OTHER): Payer: Managed Care, Other (non HMO) | Admitting: Family Medicine

## 2014-01-23 VITALS — BP 110/58 | HR 75 | Temp 98.5°F | Resp 16 | Ht 74.5 in | Wt 200.0 lb

## 2014-01-23 DIAGNOSIS — R600 Localized edema: Secondary | ICD-10-CM

## 2014-01-23 DIAGNOSIS — D509 Iron deficiency anemia, unspecified: Secondary | ICD-10-CM

## 2014-01-23 DIAGNOSIS — I2789 Other specified pulmonary heart diseases: Secondary | ICD-10-CM

## 2014-01-23 DIAGNOSIS — R252 Cramp and spasm: Secondary | ICD-10-CM

## 2014-01-23 DIAGNOSIS — M25569 Pain in unspecified knee: Secondary | ICD-10-CM

## 2014-01-23 DIAGNOSIS — I272 Pulmonary hypertension, unspecified: Secondary | ICD-10-CM

## 2014-01-23 DIAGNOSIS — G2581 Restless legs syndrome: Secondary | ICD-10-CM

## 2014-01-23 DIAGNOSIS — M25561 Pain in right knee: Secondary | ICD-10-CM

## 2014-01-23 DIAGNOSIS — R609 Edema, unspecified: Secondary | ICD-10-CM

## 2014-01-23 DIAGNOSIS — M25562 Pain in left knee: Secondary | ICD-10-CM

## 2014-01-23 LAB — CBC
HCT: 26.8 % — ABNORMAL LOW (ref 39.0–52.0)
Hemoglobin: 8.6 g/dL — ABNORMAL LOW (ref 13.0–17.0)
MCH: 24.2 pg — ABNORMAL LOW (ref 26.0–34.0)
MCHC: 32.1 g/dL (ref 30.0–36.0)
MCV: 75.3 fL — ABNORMAL LOW (ref 78.0–100.0)
Platelets: 114 10*3/uL — ABNORMAL LOW (ref 150–400)
RBC: 3.56 MIL/uL — ABNORMAL LOW (ref 4.22–5.81)
RDW: 16.5 % — ABNORMAL HIGH (ref 11.5–15.5)
WBC: 4.8 10*3/uL (ref 4.0–10.5)

## 2014-01-23 LAB — COMPLETE METABOLIC PANEL WITHOUT GFR
ALT: 12 U/L (ref 0–53)
Albumin: 3.8 g/dL (ref 3.5–5.2)
Calcium: 9.1 mg/dL (ref 8.4–10.5)
Chloride: 107 meq/L (ref 96–112)
GFR, Est Non African American: 71 mL/min
Glucose, Bld: 87 mg/dL (ref 70–99)
Total Protein: 6.6 g/dL (ref 6.0–8.3)

## 2014-01-23 LAB — COMPLETE METABOLIC PANEL WITH GFR
AST: 20 U/L (ref 0–37)
Alkaline Phosphatase: 146 U/L — ABNORMAL HIGH (ref 39–117)
BUN: 18 mg/dL (ref 6–23)
CO2: 21 mEq/L (ref 19–32)
Creat: 1.13 mg/dL (ref 0.50–1.35)
GFR, Est African American: 82 mL/min
Potassium: 4 mEq/L (ref 3.5–5.3)
Sodium: 144 mEq/L (ref 135–145)
Total Bilirubin: 1.2 mg/dL (ref 0.2–1.2)

## 2014-01-23 MED ORDER — FUROSEMIDE 40 MG PO TABS
40.0000 mg | ORAL_TABLET | Freq: Every day | ORAL | Status: DC
Start: 2014-01-23 — End: 2014-10-06

## 2014-01-23 MED ORDER — TRAZODONE HCL 50 MG PO TABS: ORAL_TABLET | ORAL | Status: DC

## 2014-01-23 MED ORDER — TRAMADOL HCL 50 MG PO TABS: 50.0000 mg | ORAL_TABLET | Freq: Three times a day (TID) | ORAL | Status: DC | PRN

## 2014-01-23 NOTE — Progress Notes (Signed)
Chief Complaint:  Chief Complaint  Patient presents with  . Follow-up    MRI,KNEE PAIN AND BLOOD PRESSURE    HPI: Ronald Zhang is a 58 y.o. male who is here for:  1. Would like refills on his meds for his  Zhang edema, HTN. He is on Lasix 40 mg and takes it daily before he goes to work. He states it helps a lot. He has 1 dizzy spell since he stopped taking his lisinopril. He states he has had more problmes with his legs sicne he changed job in the last 1 week, he is working more outside and he has had cramps. He is trying to keep his fluid intake up but he had cramps since he has been workingoutside in the heat. He is not taking more than 40 mg of Lasix daily. He denies any CP or palpiations.   2. His knees are bothering him. He wears his knees braces and the lidocaine patches help when they stay on. The knee braces he can;t wear all the time but they do give him some releif. 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   3. Anemia workup, still has to see Ronald Zhang in [place] for anemia, needs EGD/colonscopy  4. We went ove r his MRI again,   IMPRESSION:  Approximately 4 cm cluster of small cysts in the head of the  pancreas with more discrete tiny cysts scattered in the pancreatic  body, to a lesser degree. No evidence for associated abnormal  enhancement or mass effect. No biliary or pancreatic ductal  dilatation. Although cross modality comparison between ultrasound  and MRI can be challenging, there does not appear to be significant  interval progression in the 1 year since the prior ultrasound exam.  Given the history of alcohol dependence, these changes in the  pancreas may be all related to sequelae of chronic pancreatitis.  Follow-up abdominal MRI in 6 months could be used to ensure  stability.  Subtle nodularity to the liver contour  is associated with  recanalization of the paraumbilical vein and splenomegaly. Together,  these imaging features raise concern for cirrhosis and associated  portal venous hypertension.  Electronically Signed  By: Ronald CenterEric Zhang M.D.  On: 12/10/2013 12:11   Past Medical History  Diagnosis Date  . Hypertension   . Allergy   . Pulmonary HTN     TEE on March 31,2014-mild-mod concentric hypertrophy.  EF 60-65%. Left atrial valve mildly dilated, right atrium mildly dilated, mild-moderate MR, mild TR. Pum Pressure 51 mm Hg  . Cellulitis and abscess of leg     Hospitalized at [facility] from 4/5-4/10/14 for bilateral Zhang edema and cellulitis. Was on IV Lasix and Vancomycin.   . Alcohol dependency     stopped drinking since November 09, 2012, restarted drinking on 01/24/13  . Urinary incontinence    Past Surgical History  Procedure Laterality Date  . Fracture surgery Bilateral 1972    below knee   History   Social History  . Marital Status: Single    Spouse Name: N/A    Number of Children: N/A  . Years of Education: N/A   Social History Main Topics  . Smoking status: Never Smoker   . Smokeless tobacco: None  . Alcohol Use: 24.0 oz/week    40 Shots  of liquor per week     Comment: pint a day, sober since 05/07/2013  . Drug Use: No  . Sexual Activity: No   Other Topics Concern  . None   Social History Narrative   05/07/2013 AHW "Ronald Zhang" was born and grew up in [place], [place]. He has 3 younger brothers. His parents are still together. He reports he had a good childhood. He graduated from high school. He has worked for [employer] for 35 years. He married once, and has been divorced for 30 years. He has twins, a boy and girl. He denies any legal difficulties. His hobbies include family genealogy, studying Southern history, and participating in Civil War reenactments. 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 His social support system consists of friends. 05/07/2013 AHW    Family History  Problem Relation Age of Onset  . Hypertension Mother   . Hypertension Father   . Alcohol abuse Brother    Allergies  Allergen Reactions  . Penicillins Swelling    Childhood   Prior to Admission medications   Medication Sig Start Date End Date Taking? Authorizing Zhang  furosemide (LASIX) 80 MG tablet Take 40 mg by mouth daily. Takes 1/2 tablet   Yes Historical Provider, MD  lidocaine (LIDODERM) 5 % Place 1 patch onto the skin daily. Remove & Discard patch within 12 hours or as directed by MD 10/11/13  Yes Ronald P Le, DO  traZODone (DESYREL) 50 MG tablet TAKE 1 TABLET BY MOUTH AT BEDTIME   Yes Ronald P Le, DO  lisinopril-hydrochlorothiazide (PRINZIDE,ZESTORETIC) 20-25 MG per tablet Take 1 tablet by mouth daily. 10/11/13   Ronald P Le, DO  rOPINIRole (REQUIP) 1 MG tablet TAKE 1 TABLET BY MOUTH DAILY AT BEDTIME    Ronald P Le, DO  triamcinolone cream (KENALOG) 0.1 % Apply 1 application topically 2 (two) times daily as needed. 10/11/13   Ronald P Le, DO     ROS: The patient denies fevers, chills, night sweats, unintentional weight loss, chest pain, palpitations, wheezing, dyspnea on exertion, nausea, vomiting, abdominal pain, dysuria, hematuria, melena, numbness, weakness, or tingling.   All other systems have been reviewed and were otherwise negative with the exception of those mentioned in the HPI and as above.    PHYSICAL EXAM: Filed Vitals:   01/23/14 0937  BP: 110/58  Pulse: 75  Temp: 98.5 F (36.9 C)  Resp: 16   Filed Vitals:   01/23/14 0937  Height: 6' 2.5" (1.892 m)  Weight: 200 lb (90.719 kg)   Body mass index is 25.34 kg/(m^2).  General: Alert, no acute distress HEENT:  Normocephalic, atraumatic, oropharynx patent. EOMI, PERRLA Cardiovascular:  Regular rate and rhythm, no rubs murmurs or gallops.  No Carotid bruits, radial pulse intact. No pedal edema.  Respiratory: Clear to auscultation bilaterally.  No wheezes, rales, or rhonchi.  No cyanosis, no use of  accessory musculature GI:  abdomen is soft and non-tender, positive bowel sounds.  No masses. + splenomegaly Skin: No rashes. Neurologic: Facial musculature symmetric. Psychiatric: Patient is appropriate throughout our interaction. Lymphatic: No cervical lymphadenopathy Musculoskeletal: Gait intact.   LABS: Results for orders placed in visit on 01/23/14  CBC      Result Value Ref Range   WBC 4.8  4.0 - 10.5 K/uL   RBC 3.56 (*) 4.22 - 5.81 MIL/uL   Hemoglobin 8.6 (*) 13.0 - 17.0 g/dL   HCT 16.1 (*) 09.6 - 04.5 %   MCV 75.3 (*) 78.0 - 100.0 fL  MCH 24.2 (*) 26.0 - 34.0 pg   MCHC 32.1  30.0 - 36.0 g/dL   RDW 16.116.5 (*) 09.611.5 - 04.515.5 %   Platelets 114 (*) 150 - 400 K/uL  COMPLETE METABOLIC PANEL WITH GFR      Result Value Ref Range   Sodium 144  135 - 145 mEq/L   Potassium 4.0  3.5 - 5.3 mEq/L   Chloride 107  96 - 112 mEq/L   CO2 21  19 - 32 mEq/L   Glucose, Bld 87  70 - 99 mg/dL   BUN 18  6 - 23 mg/dL   Creat 4.091.13  8.110.50 - 9.141.35 mg/dL   Total Bilirubin 1.2  0.2 - 1.2 mg/dL   Alkaline Phosphatase 146 (*) 39 - 117 U/L   AST 20  0 - 37 U/L   ALT 12  0 - 53 U/L   Total Protein 6.6  6.0 - 8.3 g/dL   Albumin 3.8  3.5 - 5.2 g/dL   Calcium 9.1  8.4 - 78.210.5 mg/dL   GFR, Est African American 82     GFR, Est Non African American 71       EKG/XRAY:   Primary read interpreted by [facility]   ASSESSMENT/PLAN: Encounter Diagnoses  Name Primary?  . Pulmonary HTN Yes  . Anemia, iron deficiency   . Knee pain, bilateral   . Pedal edema   . Cramp of both lower extremities   . Restless leg    Advise to make appt with GI for EGD an lso colonscopy due to chronic anemia, overdue for colonoscpy and h/o alcohol dependence Refill LAsix Refill trazadone Rx Tramadol prn for sever pain, no NSAIDs and no Tylenol due to GI issues work note given  F/u in 3 months  Gross sideeffects, risk and benefits, and alternatives of medications d/w patient. Patient is aware that all medications have  potential sideeffects and we are unable to predict every sideeffect or drug-drug interaction that may occur.  Zhang, Ronald PHUONG, DO 01/24/2014 10:19 AM

## 2014-02-11 ENCOUNTER — Other Ambulatory Visit: Payer: Self-pay | Admitting: Family Medicine

## 2014-02-11 ENCOUNTER — Encounter: Payer: Self-pay | Admitting: Family Medicine

## 2014-02-12 NOTE — Telephone Encounter (Signed)
Faxed

## 2014-03-27 IMAGING — CR DG CHEST 2V
2 series · 2 of 2 positions shown · non-contrast
Comparison: 04/22/2007

CLINICAL DATA: Chest pain, pedal edema

CHEST - 2 VIEW

[PA]
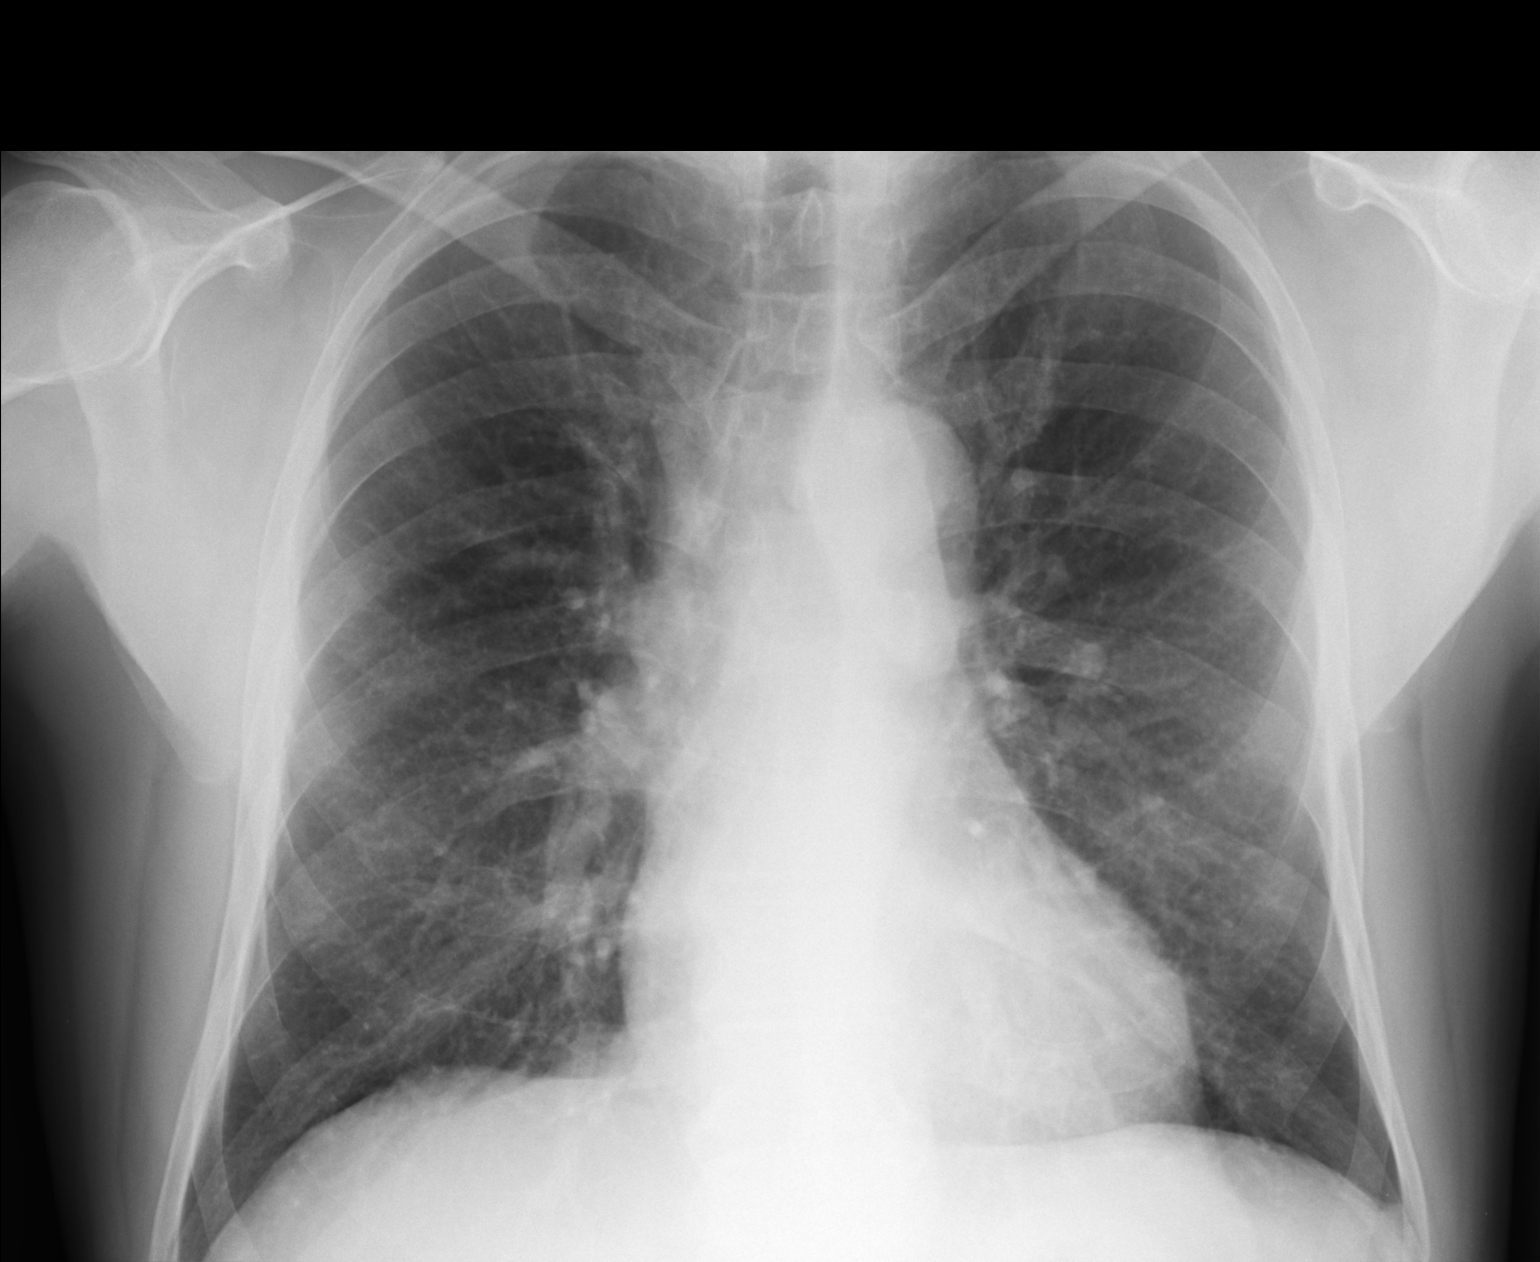

[lateral]
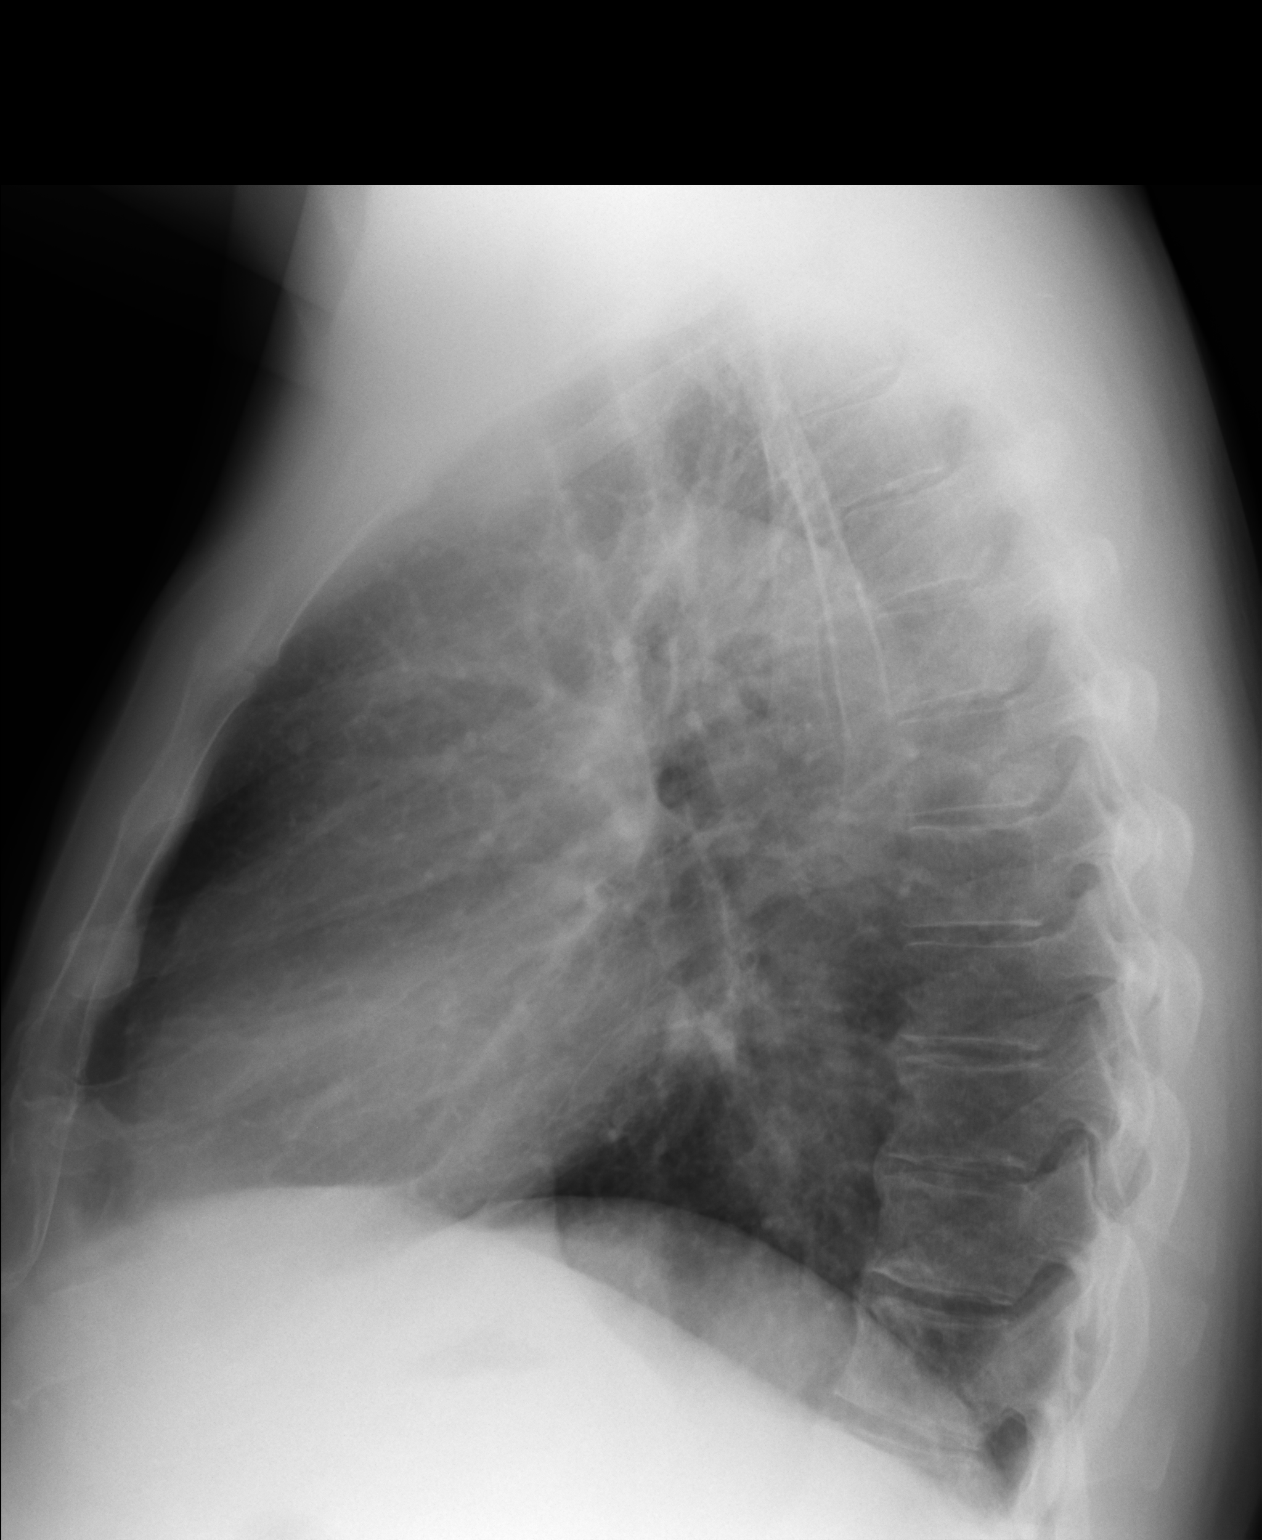

[2 of 2 positions shown; findings below may reference images not displayed]

FINDINGS: Chronic interstitial markings.  No frank interstitial
edema.  No focal consolidation.  No pleural effusion or
pneumothorax.

The heart is normal in size.

Mild degenerative changes of the visualized thoracolumbar spine.
IMPRESSION: No evidence of acute cardiopulmonary disease.

## 2014-05-01 ENCOUNTER — Ambulatory Visit: Payer: Managed Care, Other (non HMO) | Admitting: Family Medicine

## 2014-05-04 ENCOUNTER — Telehealth (HOSPITAL_COMMUNITY): Payer: Self-pay

## 2014-05-14 NOTE — Telephone Encounter (Signed)
NO MSG °

## 2014-06-18 ENCOUNTER — Encounter (HOSPITAL_COMMUNITY): Payer: Self-pay | Admitting: Psychology

## 2014-08-25 ENCOUNTER — Telehealth: Payer: Self-pay

## 2014-08-25 NOTE — Telephone Encounter (Signed)
Patient needs refill ropinirole

## 2014-08-26 MED ORDER — ROPINIROLE HCL 1 MG PO TABS: 1.0000 mg | ORAL_TABLET | Freq: Every day | ORAL | Status: DC

## 2014-08-26 NOTE — Telephone Encounter (Signed)
Pt states he really need his medication today if possible since the pharmacy tried to get in touch with us Friday. It is the ROPINIROLE. Please call pt at [phone]   [facility] [address] [place]

## 2014-08-26 NOTE — Telephone Encounter (Signed)
Sent in 1 mos RF w/note pt needs OV for more. LMOM for pt.

## 2014-09-21 ENCOUNTER — Other Ambulatory Visit: Payer: Self-pay | Admitting: Family Medicine

## 2014-10-06 ENCOUNTER — Ambulatory Visit (INDEPENDENT_AMBULATORY_CARE_PROVIDER_SITE_OTHER): Payer: Managed Care, Other (non HMO) | Admitting: Family Medicine

## 2014-10-06 VITALS — BP 120/72 | HR 87 | Temp 97.9°F | Resp 18 | Ht 76.0 in | Wt 209.0 lb

## 2014-10-06 DIAGNOSIS — N529 Male erectile dysfunction, unspecified: Secondary | ICD-10-CM | POA: Diagnosis not present

## 2014-10-06 DIAGNOSIS — D649 Anemia, unspecified: Secondary | ICD-10-CM | POA: Diagnosis not present

## 2014-10-06 DIAGNOSIS — F1010 Alcohol abuse, uncomplicated: Secondary | ICD-10-CM

## 2014-10-06 DIAGNOSIS — M25569 Pain in unspecified knee: Secondary | ICD-10-CM

## 2014-10-06 DIAGNOSIS — G4700 Insomnia, unspecified: Secondary | ICD-10-CM | POA: Diagnosis not present

## 2014-10-06 DIAGNOSIS — G2581 Restless legs syndrome: Secondary | ICD-10-CM | POA: Diagnosis not present

## 2014-10-06 DIAGNOSIS — K862 Cyst of pancreas: Secondary | ICD-10-CM

## 2014-10-06 DIAGNOSIS — I270 Primary pulmonary hypertension: Secondary | ICD-10-CM

## 2014-10-06 DIAGNOSIS — R600 Localized edema: Secondary | ICD-10-CM | POA: Diagnosis not present

## 2014-10-06 DIAGNOSIS — G8929 Other chronic pain: Secondary | ICD-10-CM

## 2014-10-06 DIAGNOSIS — I272 Pulmonary hypertension, unspecified: Secondary | ICD-10-CM

## 2014-10-06 DIAGNOSIS — F1011 Alcohol abuse, in remission: Secondary | ICD-10-CM

## 2014-10-06 LAB — POCT CBC
Granulocyte percent: 70.5 % (ref 37–80)
HCT, POC: 29.4 % — AB (ref 43.5–53.7)
Hemoglobin: 8.6 g/dL — AB (ref 14.1–18.1)
Lymph, poc: 0.8 (ref 0.6–3.4)
MCH, POC: 21.9 pg — AB (ref 27–31.2)
MCHC: 29.1 g/dL — AB (ref 31.8–35.4)
MCV: 75.4 fL — AB (ref 80–97)
MID (cbc): 0.3 (ref 0–0.9)
MPV: 8.7 fL (ref 0–99.8)
POC Granulocyte: 2.7 (ref 2–6.9)
POC LYMPH PERCENT: 2.6 %L — AB (ref 10–50)
POC MID %: 7.9 % (ref 0–12)
Platelet Count, POC: 87 10*3/uL — AB (ref 142–424)
RBC: 3.9 M/uL — AB (ref 4.69–6.13)
RDW, POC: 20.2 %
WBC: 3.8 10*3/uL — AB (ref 4.6–10.2)

## 2014-10-06 LAB — LIPID PANEL
Cholesterol: 107 mg/dL (ref 0–200)
HDL: 29 mg/dL — ABNORMAL LOW (ref >=40)
LDL Cholesterol: 66 mg/dL (ref 0–99)
Total CHOL/HDL Ratio: 3.7 Ratio
Triglycerides: 61 mg/dL (ref <150)
VLDL: 12 mg/dL (ref 0–40)

## 2014-10-06 LAB — COMPLETE METABOLIC PANEL WITHOUT GFR
Albumin: 3.7 g/dL (ref 3.5–5.2)
Alkaline Phosphatase: 109 U/L (ref 39–117)
CO2: 26 meq/L (ref 19–32)
Creat: 0.84 mg/dL (ref 0.50–1.35)
GFR, Est Non African American: >89 mL/min
Sodium: 142 meq/L (ref 135–145)

## 2014-10-06 LAB — COMPLETE METABOLIC PANEL WITH GFR
ALT: 10 U/L (ref 0–53)
AST: 19 U/L (ref 0–37)
BUN: 16 mg/dL (ref 6–23)
Calcium: 8.8 mg/dL (ref 8.4–10.5)
Chloride: 109 mEq/L (ref 96–112)
GFR, Est African American: >89 mL/min
Glucose, Bld: 82 mg/dL (ref 70–99)
Potassium: 4 mEq/L (ref 3.5–5.3)
Total Bilirubin: 1.1 mg/dL (ref 0.2–1.2)
Total Protein: 6.5 g/dL (ref 6.0–8.3)

## 2014-10-06 LAB — TSH: TSH: 1.592 u[IU]/mL (ref 0.350–4.500)

## 2014-10-06 MED ORDER — GABAPENTIN 100 MG PO CAPS: 100.0000 mg | ORAL_CAPSULE | Freq: Every day | ORAL | Status: DC

## 2014-10-06 MED ORDER — FUROSEMIDE 40 MG PO TABS: 40.0000 mg | ORAL_TABLET | Freq: Every day | ORAL | Status: DC

## 2014-10-06 MED ORDER — TRAMADOL HCL 50 MG PO TABS: 50.0000 mg | ORAL_TABLET | Freq: Three times a day (TID) | ORAL | Status: DC | PRN

## 2014-10-06 MED ORDER — TRAZODONE HCL 50 MG PO TABS: ORAL_TABLET | ORAL | Status: DC

## 2014-10-06 MED ORDER — SILDENAFIL CITRATE 50 MG PO TABS: 50.0000 mg | ORAL_TABLET | Freq: Every day | ORAL | Status: DC | PRN

## 2014-10-06 NOTE — Progress Notes (Signed)
Chief Complaint:  Chief Complaint  Patient presents with  . Follow-up    medications  . rx refills    lasix, requip, trazodone    HPI: Ronald Zhang Zhang is a 59 y.o. male who is here for  Medication refills: He is at work again, he is doing fine, his knees still bother him so would like to get a parking placard filled out again He wants to get his teeth fixed, [facility] and so is saving money fo rthat He was going to get knee surgery the first of the year , but will  Be probably May 1st, he still has yet to go see the GI doctor for his chronic anemia workup,it is iron def anemia but he ahs a ho alcohol induced cirrhosis and needs a colonscopyHe has not gone toe has not had any bleeding in urine or stool, his dizziness ahs been resolved, he deneis CP or SOB or palpitations. He has not drank in over 17 months He has a mole spot on his chest that he needs to be looked at, he has had it several months ago, it is the same size for the last 6 months, he wants to know if he needs to be referred to [facility], No skin cancer in family  Blood pressure has been good, normal with Lasix only  He had an abnormal pnacreatic cyst on [place] in 2014 and finally got his Ronald Zhang abd and pelvis. He was recommended to get a follow-up MRI in 6 mnths. It was difficult to get the first MRI due to cost so I am not sure if he will be doing this regular.  Please seeMR from 12/2013  below IMPRESSION: Approximately 4 cm cluster of small cysts in the head of the pancreas with more discrete tiny cysts scattered in the pancreatic body, to a lesser degree. No evidence for associated abnormal enhancement or mass effect. No biliary or pancreatic ductal dilatation. Although cross modality comparison between ultrasound and MRI can be challenging, there does not appear to be significant interval progression in the 1 year since the prior ultrasound exam. Given the history of alcohol dependence, these  changes in the pancreas may be all related to sequelae of chronic pancreatitis. Follow-up abdominal MRI in 6 months could be used to ensure stability.  Subtle nodularity to the liver contour is associated with recanalization of the paraumbilical vein and splenomegaly. Together, these imaging features raise concern for cirrhosis and associated portal venous hypertension.   Electronically Signed  By: Ronald Zhang Zhang M.D.  On: 12/10/2013 12:11    Wt Readings from Last 3 Encounters:  10/06/14 209 lb (94.802 kg)  01/23/14 200 lb (90.719 kg)  11/21/13 205 lb (92.987 kg)    BP Readings from Last 3 Encounters:  10/06/14 120/72  01/23/14 110/58  11/21/13 104/53     Past Medical History  Diagnosis Date  . Hypertension   . Allergy   . Pulmonary HTN     TEE on March 31,2014-mild-mod concentric hypertrophy.  EF 60-65%. Left atrial valve mildly dilated, right atrium mildly dilated, mild-moderate Ronald Zhang, mild TR. Pum Pressure 51 mm Hg  . Cellulitis and abscess of leg     Hospitalized at [facility] from 4/5-4/10/14 for bilateral Ronald Zhang Zhang edema and cellulitis. Was on IV Lasix and Vancomycin.   . Alcohol dependency     stopped drinking since November 09, 2012, restarted drinking on 01/24/13  . Urinary incontinence   . Arthritis  bilateral knees,secondary to trauma  . Pancreatic cyst     Multiple, MRI of abd and pelvis in 12/2013  , ? sequelae of chronic pancreatitis , he has a hx of alcoholism, suggested repeat MRI in 6 months fro progression, appears nonmalignant  . Anemia     iron deficiency, non compliant with iron  . Restless leg   . Insomnia     3rd shift   . Erectile dysfunction    Past Surgical History  Procedure Laterality Date  . Fracture surgery Bilateral 1972    below knee   History   Social History  . Marital Status: Single    Spouse Name: N/A  . Number of Children: N/A  . Years of Education: N/A   Social History Main Topics  . Smoking status: Never Smoker     . Smokeless tobacco: Not on file  . Alcohol Use: 24.0 oz/week    40 Shots of liquor per week     Comment: pint a day, sober since 05/07/2013  . Drug Use: No  . Sexual Activity: No   Other Topics Concern  . None   Social History Narrative   05/07/2013 AHW "[name]" was born and grew up in [place]. He has 3 younger brothers. His parents are still together. He reports he had a good childhood. He graduated from high school. 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 He married once, and has been divorced for 30 years. He has twins, a boy and girl. He denies any legal difficulties. His hobbies include family genealogy, studying Southern history, and participating in Civil War reenactments. 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 His social support system consists of friends. 05/07/2013 AHW   Family History  Problem Relation Age of Onset  . Hypertension Mother   . Hypertension Father   . Alcohol abuse Brother    Allergies  Allergen Reactions  . Penicillins Swelling    Childhood   Prior to Admission medications   Medication Sig Start Date End Date Taking? Authorizing Provider  furosemide (LASIX) 40 MG tablet Take 1 tablet (40 mg total) by mouth daily. Leg swelling 01/23/14  Yes Ronald Zhang Jeschke P Watson Robarge, DO  rOPINIRole (REQUIP) 1 MG tablet Take 1 tablet (1 mg total) by mouth at bedtime. NO MORE REFILLS WITHOUT OFFICE VISIT - 2ND NOTICE 09/22/14  Yes Ronald Zhang Ronald Zhang L Weber, PA-C  traZODone (DESYREL) 50 MG tablet Take 1/2 tab PO nightly prn 01/23/14  Yes Ronald Zhang Zhang P Forest Pruden, DO  lidocaine (LIDODERM) 5 % Place 1 patch onto the skin daily. Remove & Discard patch within 12 hours or as directed by MD Patient not taking: Reported on 10/06/2014 10/11/13   Ronald Zhang Zhang P Ronald Knoop, DO  traMADol (ULTRAM) 50 MG tablet TAKE 1 TABLET BY MOUTH EVERY 8 HOURS AS NEEDED Patient not taking: Reported on 10/06/2014    Ronald Zhang Zhang P Ronald Waide, DO  triamcinolone cream (KENALOG) 0.1 % Apply 1 application topically 2 (two) times daily as needed. Patient not taking:  Reported on 10/06/2014 10/11/13   Jase Reep P Bellatrix Devonshire, DO     ROS: The patient denies fevers, chills, night sweats, unintentional weight loss, chest pain, palpitations, wheezing, dyspnea on exertion, nausea, vomiting, abdominal pain, dysuria, hematuria, melena, numbness, weakness, or tingling.   All other systems have been reviewed and were otherwise negative with the exception of those mentioned in the HPI and as above.    PHYSICAL EXAM: Filed Vitals:   10/06/14 0953  BP: 120/72  Pulse: 87  Temp: 97.9 F (  36.6 C)  Resp: 18   Filed Vitals:   10/06/14 0953  Height:  (1.93 m)  Weight: 209 lb (94.802 kg)   Body mass index is 25.45 kg/(m^2).  General: Alert, no acute distress HEENT:  Normocephalic, atraumatic, oropharynx patent. EOMI, PERRLA, + minimal jaundice, normal for hi, Cardiovascular:  Regular rate and rhythm, no rubs murmurs or gallops.  No Carotid bruits, radial pulse intact. Trace pedal edema.  Respiratory: Clear to auscultation bilaterally.  No wheezes, rales, or rhonchi.  No cyanosis, no use of accessory musculature GI: No organomegaly, abdomen is soft and non-tender, positive bowel sounds.  No masses. Skin: + SK in right upper anteriro chest Neurologic: Facial musculature symmetric. Psychiatric: Patient is appropriate throughout our interaction. Lymphatic: No cervical lymphadenopathy Musculoskeletal: Gait  Antalgic due to knee pain  LABS: Results for orders placed or performed in visit on 10/06/14  COMPLETE METABOLIC PANEL WITH GFR  Result Value Ref Range   Sodium 142 135 - 145 mEq/L   Potassium 4.0 3.5 - 5.3 mEq/L   Chloride 109 96 - 112 mEq/L   CO2 26 19 - 32 mEq/L   Glucose, Bld 82 70 - 99 mg/dL   BUN 16 6 - 23 mg/dL   Creat 1.61 0.96 - 0.45 mg/dL   Total Bilirubin 1.1 0.2 - 1.2 mg/dL   Alkaline Phosphatase 109 39 - 117 U/L   AST 19 0 - 37 U/L   ALT 10 0 - 53 U/L   Total Protein 6.5 6.0 - 8.3 g/dL   Albumin 3.7 3.5 - 5.2 g/dL   Calcium 8.8 8.4 - 40.9 mg/dL    GFR, Est African American >89 mL/min   GFR, Est Non African American >89 mL/min  TSH  Result Value Ref Range   TSH 1.592 0.350 - 4.500 uIU/mL  Lipid panel  Result Value Ref Range   Cholesterol 107 0 - 200 mg/dL   Triglycerides 61 <811 mg/dL   HDL 29 (L) >=91 mg/dL   Total CHOL/HDL Ratio 3.7 Ratio   VLDL 12 0 - 40 mg/dL   LDL Cholesterol 66 0 - 99 mg/dL  POCT CBC  Result Value Ref Range   WBC 3.8 (A) 4.6 - 10.2 K/uL   Lymph, poc 0.8 0.6 - 3.4   POC LYMPH PERCENT 2.6 (A) 10 - 50 %L   MID (cbc) 0.3 0 - 0.9   POC MID % 7.9 0 - 12 %M   POC Granulocyte 2.7 2 - 6.9   Granulocyte percent 70.5 37 - 80 %G   RBC 3.90 (A) 4.69 - 6.13 M/uL   Hemoglobin 8.6 (A) 14.1 - 18.1 g/dL   HCT, POC 47.8 (A) 29.5 - 53.7 %   MCV 75.4 (A) 80 - 97 fL   MCH, POC 21.9 (A) 27 - 31.2 pg   MCHC 29.1 (A) 31.8 - 35.4 g/dL   RDW, POC 62.1 %   Platelet Count, POC 87 (A) 142 - 424 K/uL   MPV 8.7 0 - 99.8 fL     EKG/XRAY:   Primary read interpreted by Dr. Conley Rolls at [facility]   ASSESSMENT/PLAN: Encounter Diagnoses  Name Primary?  . Pulmonary HTN Yes  . Restless leg   . Anemia, unspecified anemia type   . Knee pain, chronic, unspecified laterality   . H/O alcohol abuse   . Erectile dysfunction, unspecified erectile dysfunction type   . Pedal edema   . Insomnia   . Pancreatic cyst    Ronald Zhang Zhang with  PMH of iron deficiency anemia, alcohol dependence/abuse in remission for the last 17 month, pulmonary HTN, pedal edema and hisotry of pancreatic cyst on [place] in 2014 Is here for a recheck of his Hgb.  He is stable with his anemia.  His IFOBT has been negative. He is supposed to called Dr Earlean Shawl for GI eval in [place], needs colonscopy, repeat MRI of abd for pancreatic cyst, et but he has not done taht due to trying to priortize all his needs Since requip not working, rx Gabpentin, instead of requip for restless leg Refills on all meds except requip He would like a trial of viagra to help with his  ED, he declines any STD testing, precautiosn given about hypotension He needs to be seen by cardiology  In case he has to have knee surgery or get colonoscopy and needs cardiac evalaution/clearance  Advise to get his GI appt set up, get his cardiology appt set up.  Will see in back in 6 months or prn  Palcard form filled out, he will try to send me the [employer] bus for [place] and I will sign off once i get the forms.  Labs pending  Gross sideeffects, risk and benefits, and alternatives of medications d/w patient. Patient is aware that all medications have potential sideeffects and we are unable to predict every sideeffect or drug-drug interaction that may occur.  Eulene Pekar PHUONG, DO 10/08/2014 9:56 AM

## 2014-10-08 ENCOUNTER — Encounter: Payer: Self-pay | Admitting: Family Medicine

## 2014-10-08 DIAGNOSIS — I272 Pulmonary hypertension, unspecified: Secondary | ICD-10-CM | POA: Insufficient documentation

## 2014-10-08 DIAGNOSIS — G2581 Restless legs syndrome: Secondary | ICD-10-CM | POA: Insufficient documentation

## 2014-10-08 DIAGNOSIS — G8929 Other chronic pain: Secondary | ICD-10-CM | POA: Insufficient documentation

## 2014-10-08 DIAGNOSIS — D649 Anemia, unspecified: Secondary | ICD-10-CM | POA: Insufficient documentation

## 2014-10-08 DIAGNOSIS — K862 Cyst of pancreas: Secondary | ICD-10-CM | POA: Insufficient documentation

## 2014-10-08 DIAGNOSIS — M25569 Pain in unspecified knee: Secondary | ICD-10-CM

## 2014-10-08 DIAGNOSIS — F1011 Alcohol abuse, in remission: Secondary | ICD-10-CM | POA: Insufficient documentation

## 2014-10-08 DIAGNOSIS — N529 Male erectile dysfunction, unspecified: Secondary | ICD-10-CM | POA: Insufficient documentation

## 2014-10-08 DIAGNOSIS — G4700 Insomnia, unspecified: Secondary | ICD-10-CM | POA: Insufficient documentation

## 2014-10-18 ENCOUNTER — Encounter: Payer: Self-pay | Admitting: Family Medicine

## 2014-10-21 ENCOUNTER — Telehealth: Payer: Self-pay

## 2014-10-21 DIAGNOSIS — N529 Male erectile dysfunction, unspecified: Secondary | ICD-10-CM

## 2014-10-21 NOTE — Telephone Encounter (Signed)
Pt called to say the insurance company and the pharmacy need to know why pt need the Viagra Please call [phone]      [facility] [place]

## 2014-10-21 NOTE — Telephone Encounter (Signed)
PA denied for Viagra. Plan cover Cialis 6 pills per 30 days. Please advise.

## 2014-10-22 MED ORDER — TADALAFIL 20 MG PO TABS: 10.0000 mg | ORAL_TABLET | Freq: Every day | ORAL | Status: DC | PRN

## 2014-10-22 NOTE — Telephone Encounter (Signed)
Will do cilais, can you call the p atient and let him know his insurance will cover cialisbut not Viagra. Thanks Dr Conley RollsLe

## 2014-10-23 NOTE — Telephone Encounter (Signed)
Gave pt message.

## 2014-10-27 ENCOUNTER — Telehealth: Payer: Self-pay

## 2014-10-27 NOTE — Telephone Encounter (Signed)
Pt wants Dr. Conley RollsLe to know he is having trouble getting his sildenafil (VIAGRA) 50 MG tablet [[id number]][[id number]] switched to his new script tadalafil (CIALIS) 20 MG tablet [[id number]][[id number]]. Please advise at [phone]

## 2014-10-28 NOTE — Telephone Encounter (Signed)
Rx was changed to Cialis. Advised pt to call back, Im not sure what he needs.

## 2014-11-02 ENCOUNTER — Telehealth: Payer: Self-pay

## 2014-11-02 DIAGNOSIS — N529 Male erectile dysfunction, unspecified: Secondary | ICD-10-CM

## 2014-11-02 MED ORDER — TADALAFIL 20 MG PO TABS: 10.0000 mg | ORAL_TABLET | Freq: Every day | ORAL | Status: DC | PRN

## 2014-11-02 NOTE — Telephone Encounter (Signed)
Cydney Okamara N Dmonte Maher, CMA at 10/21/2014 1:59 PM     Status: Signed       Expand All Collapse All   PA denied for Viagra. Plan cover Cialis 6 pills per 30 days. Please advise.      This was sent in already. Called pt, advised I will send in again.

## 2014-11-02 NOTE — Telephone Encounter (Signed)
Pt requesting Cialis be called into pharmacy   Pharmacy [facility] [place]   Best phone [phone]

## 2014-11-03 ENCOUNTER — Other Ambulatory Visit: Payer: Self-pay | Admitting: Family Medicine

## 2014-12-15 ENCOUNTER — Other Ambulatory Visit: Payer: Self-pay | Admitting: Physician Assistant

## 2014-12-16 ENCOUNTER — Other Ambulatory Visit: Payer: Self-pay | Admitting: Family Medicine

## 2014-12-17 ENCOUNTER — Telehealth: Payer: Self-pay

## 2014-12-17 NOTE — Telephone Encounter (Signed)
Error- I found his medication. gg

## 2015-01-04 ENCOUNTER — Telehealth: Payer: Self-pay | Admitting: Family Medicine

## 2015-01-05 NOTE — Telephone Encounter (Signed)
Pt is in [place] today and he is checking on the status of this refill. I told him it takes 24/48 hrs for this typically to go through. Please advise at [phone]

## 2015-01-05 NOTE — Telephone Encounter (Signed)
Patient wasn't aware that he would have to pick up his medication on a paper prescription and states that he would like it refilled today while he's in [place] for his dentist appointment. He states his appointment is at 12:30 and he'll be around the rest of the afternoon. Please call! Patient phone: [phone]

## 2015-01-05 NOTE — Telephone Encounter (Signed)
Patient is heading back home and was checking to see if his request for Tramadol was ready.  [phone]

## 2015-01-06 ENCOUNTER — Other Ambulatory Visit: Payer: Self-pay | Admitting: Family Medicine

## 2015-01-06 MED ORDER — TRAMADOL HCL 50 MG PO TABS: 50.0000 mg | ORAL_TABLET | Freq: Three times a day (TID) | ORAL | Status: DC | PRN

## 2015-01-06 NOTE — Telephone Encounter (Signed)
Patient called to see if his medication is ready. Please call!

## 2015-01-07 NOTE — Telephone Encounter (Signed)
[facility]. Pt notified on voicemail.

## 2015-01-26 ENCOUNTER — Telehealth: Payer: Self-pay

## 2015-01-26 DIAGNOSIS — I361 Nonrheumatic tricuspid (valve) insufficiency: Secondary | ICD-10-CM | POA: Insufficient documentation

## 2015-01-26 NOTE — Telephone Encounter (Signed)
Pt wants Dr. Conley Rolls to know-he went to [facility] in [place] and saw Dr. Dulce Sellar.  Dr. Dulce Sellar will be sending over a report on this pt. Please advise at [phone]

## 2015-02-05 ENCOUNTER — Ambulatory Visit (INDEPENDENT_AMBULATORY_CARE_PROVIDER_SITE_OTHER): Payer: BLUE CROSS/BLUE SHIELD | Admitting: Internal Medicine

## 2015-02-05 VITALS — BP 118/68 | HR 69 | Temp 98.0°F | Resp 17 | Ht 76.0 in | Wt 203.8 lb

## 2015-02-05 DIAGNOSIS — G8929 Other chronic pain: Secondary | ICD-10-CM

## 2015-02-05 DIAGNOSIS — M25562 Pain in left knee: Secondary | ICD-10-CM | POA: Diagnosis not present

## 2015-02-05 MED ORDER — HYDROCODONE-ACETAMINOPHEN 5-325 MG PO TABS: 1.0000 | ORAL_TABLET | Freq: Four times a day (QID) | ORAL | Status: DC | PRN
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
## 2015-03-04 ENCOUNTER — Other Ambulatory Visit: Payer: Self-pay | Admitting: Family Medicine

## 2015-03-06 ENCOUNTER — Telehealth: Payer: Self-pay | Admitting: Radiology

## 2015-03-06 NOTE — Telephone Encounter (Signed)
Pt states his pharmacy recv'd his Rx for Tramadol and his insurance does not cover this. He would like to know if there was something else he could be prescribed.

## 2015-03-08 ENCOUNTER — Other Ambulatory Visit: Payer: Self-pay | Admitting: Family Medicine

## 2015-03-08 MED ORDER — TRAMADOL HCL 50 MG PO TABS: 50.0000 mg | ORAL_TABLET | Freq: Three times a day (TID) | ORAL | Status: DC | PRN

## 2015-03-08 NOTE — Telephone Encounter (Signed)
Patient is calling about his tramadol medication. He states that sometimes he has to come to our office and pick it up and other times he goes through the drive thu at the pharmacy. He would like for [place] to call him and let him know if he needs to come to [facility] to get his medication or not. He also states that he would like his medication today. Please call! [phone]

## 2015-03-10 NOTE — Telephone Encounter (Signed)
LM about tramadol is the best option for him, has a hx of cirrhosis and needs to monitor LFTs if give naything else. Called in tramadol rx.

## 2015-03-16 ENCOUNTER — Other Ambulatory Visit: Payer: Self-pay | Admitting: Family Medicine

## 2015-03-17 LAB — HEPATITIS B SURFACE AB-POST VC IMM ST: HEP B SURFACE AB, QUAL: REACTIVE

## 2015-03-17 LAB — IRON AND TIBC
%SAT: 8.4
Iron: 38
TIBC: 452

## 2015-03-17 LAB — PROTIME-INR

## 2015-03-21 ENCOUNTER — Telehealth: Payer: Self-pay

## 2015-03-21 NOTE — Telephone Encounter (Signed)
Ronald Zhang wanted to let Dr. Conley Rolls know that he will be having his colonoscopy and endoscopy on [id number] and he will return to see her after that.

## 2015-03-30 DIAGNOSIS — K635 Polyp of colon: Secondary | ICD-10-CM | POA: Insufficient documentation

## 2015-03-30 DIAGNOSIS — K2960 Other gastritis without bleeding: Secondary | ICD-10-CM | POA: Insufficient documentation

## 2015-03-30 LAB — HM COLONOSCOPY

## 2015-03-30 LAB — ENDO/OSCOPIES

## 2015-04-02 ENCOUNTER — Telehealth: Payer: Self-pay

## 2015-04-02 NOTE — Telephone Encounter (Signed)
Patient would like a refill for Tramadol sent to [facility] in [place]

## 2015-04-05 NOTE — Telephone Encounter (Signed)
Awaiting response from Dr.Le.

## 2015-04-05 NOTE — Telephone Encounter (Signed)
Patient is calling to check on the status of the most recent refill request

## 2015-04-06 ENCOUNTER — Other Ambulatory Visit: Payer: Self-pay | Admitting: Family Medicine

## 2015-04-06 MED ORDER — TRAMADOL HCL 50 MG PO TABS: 50.0000 mg | ORAL_TABLET | Freq: Three times a day (TID) | ORAL | Status: DC | PRN

## 2015-04-06 NOTE — Telephone Encounter (Signed)
Medication called in 

## 2015-04-06 NOTE — Telephone Encounter (Signed)
Patient has called again about this prescription. I talked to him early this morning too. Advised patient that [facility] will call him when his script is available for pick up at his pharmacy.

## 2015-04-14 ENCOUNTER — Ambulatory Visit (INDEPENDENT_AMBULATORY_CARE_PROVIDER_SITE_OTHER): Payer: BLUE CROSS/BLUE SHIELD

## 2015-04-14 ENCOUNTER — Ambulatory Visit (INDEPENDENT_AMBULATORY_CARE_PROVIDER_SITE_OTHER): Payer: BLUE CROSS/BLUE SHIELD | Admitting: Family Medicine

## 2015-04-14 VITALS — BP 152/84 | HR 80 | Temp 97.8°F | Resp 18 | Ht 76.0 in | Wt 207.0 lb

## 2015-04-14 DIAGNOSIS — M25561 Pain in right knee: Secondary | ICD-10-CM | POA: Diagnosis not present

## 2015-04-14 DIAGNOSIS — M79644 Pain in right finger(s): Secondary | ICD-10-CM

## 2015-04-14 DIAGNOSIS — D509 Iron deficiency anemia, unspecified: Secondary | ICD-10-CM | POA: Diagnosis not present

## 2015-04-14 DIAGNOSIS — I10 Essential (primary) hypertension: Secondary | ICD-10-CM

## 2015-04-14 DIAGNOSIS — M25562 Pain in left knee: Secondary | ICD-10-CM | POA: Diagnosis not present

## 2015-04-14 DIAGNOSIS — G2581 Restless legs syndrome: Secondary | ICD-10-CM | POA: Diagnosis not present

## 2015-04-14 DIAGNOSIS — S62602A Fracture of unspecified phalanx of right middle finger, initial encounter for closed fracture: Secondary | ICD-10-CM

## 2015-04-14 DIAGNOSIS — S62609A Fracture of unspecified phalanx of unspecified finger, initial encounter for closed fracture: Secondary | ICD-10-CM

## 2015-04-14 LAB — COMPLETE METABOLIC PANEL WITHOUT GFR
Calcium: 9.2 mg/dL (ref 8.6–10.3)
Chloride: 105 mmol/L (ref 98–110)
GFR, Est African American: >89 mL/min (ref >=60)
GFR, Est Non African American: 89 mL/min (ref >=60)

## 2015-04-14 LAB — POCT CBC
Granulocyte percent: 73.9 % (ref 37–80)
HCT, POC: 43.1 % — AB (ref 43.5–53.7)
Hemoglobin: 12.4 g/dL — AB (ref 14.1–18.1)
Lymph, poc: 0.8 (ref 0.6–3.4)
MCH, POC: 22.9 pg — AB (ref 27–31.2)
MCHC: 28.7 g/dL — AB (ref 31.8–35.4)
MCV: 79.5 fL — AB (ref 80–97)
MID (cbc): 0.3 (ref 0–0.9)
MPV: 9.1 fL (ref 0–99.8)
POC Granulocyte: 3.1 (ref 2–6.9)
POC LYMPH PERCENT: 18.4 %L (ref 10–50)
POC MID %: 7.7 % (ref 0–12)
Platelet Count, POC: 85 10*3/uL — AB (ref 142–424)
RBC: 5.42 M/uL (ref 4.69–6.13)
RDW, POC: 21.2 %
WBC: 4.2 10*3/uL — AB (ref 4.6–10.2)

## 2015-04-14 LAB — IRON: Iron: 37 ug/dL — ABNORMAL LOW (ref 50–180)

## 2015-04-14 LAB — COMPLETE METABOLIC PANEL WITH GFR
ALT: 13 U/L (ref 9–46)
AST: 23 U/L (ref 10–35)
Albumin: 3.9 g/dL (ref 3.6–5.1)
Alkaline Phosphatase: 113 U/L (ref 40–115)
BUN: 12 mg/dL (ref 7–25)
CO2: 28 mmol/L (ref 20–31)
Creat: 0.93 mg/dL (ref 0.70–1.25)
Glucose, Bld: 87 mg/dL (ref 65–99)
Potassium: 3.9 mmol/L (ref 3.5–5.3)
Sodium: 142 mmol/L (ref 135–146)
Total Bilirubin: 1.6 mg/dL — ABNORMAL HIGH (ref 0.2–1.2)
Total Protein: 7 g/dL (ref 6.1–8.1)

## 2015-04-14 LAB — IBC PANEL
%SAT: 9 % — ABNORMAL LOW (ref 15–60)
TIBC: 425 ug/dL (ref 250–425)
UIBC: 388 ug/dL (ref 125–400)

## 2015-04-14 LAB — FERRITIN: Ferritin: 16 ng/mL — ABNORMAL LOW (ref 22–322)

## 2015-04-14 MED ORDER — TRAMADOL HCL 50 MG PO TABS: 50.0000 mg | ORAL_TABLET | Freq: Two times a day (BID) | ORAL | Status: DC | PRN

## 2015-04-14 NOTE — Progress Notes (Signed)
 Chief Complaint:  Chief Complaint  Patient presents with  . Hand Pain    rt middle finger swollen and sore x1 week     HPI: Ronald Zhang is a 59 y.o. male who reports to [facility] today complaining of recheck of BP, chronic medicines , knee pain, colonscopy, cardiology fu and also anemia Colonscopy wa done by Dr Ronald Zhang, he has polyps but no cancer Cardiology report was not received by [place] Needs repeat labs for anemia sicne he did not want to do it at [facility]. HE ahs been on iron supplements x 2 daily, he is constipated, advise to take miralax or colace He needs  To see ortho for knee OA and would like surgery before the holidays He has had a 1 week hx of right hand middle finger pain for 1 week. NKI, he deneis gout. He has not had any redness or warmth to it, It was better and then got worse at work. Denies n/w/t   Past Medical History  Diagnosis Date  . Hypertension   . Allergy   . Pulmonary HTN     TEE on March 31,2014-mild-mod concentric hypertrophy.  EF 60-65%. Left atrial valve mildly dilated, right atrium mildly dilated, mild-moderate MR, mild TR. Pum Pressure 51 mm Hg  . Cellulitis and abscess of leg     Hospitalized at [facility] from 4/5-4/10/14 for bilateral  edema and cellulitis. Was on IV Lasix and Vancomycin.   . Alcohol dependency     stopped drinking since November 09, 2012, restarted drinking on 01/24/13  . Urinary incontinence   . Arthritis     bilateral knees,secondary to trauma  . Pancreatic cyst     Multiple, MRI of abd and pelvis in 12/2013  , ? sequelae of chronic pancreatitis , he has a hx of alcoholism, suggested repeat MRI in 6 months fro progression, appears nonmalignant  . Anemia     iron deficiency, non compliant with iron  . Restless leg   . Insomnia     3rd shift   . Erectile dysfunction   . H/O non anemic vitamin B12 deficiency 2014   Past Surgical History  Procedure Laterality Date  . Fracture surgery Bilateral 1972   below knee   Social History   Social History  . Marital Status: Single    Spouse Name: N/A  . Number of Children: N/A  . Years of Education: N/A   Social History Main Topics  . Smoking status: Never Smoker   . Smokeless tobacco: None  . Alcohol Use: 24.0 oz/week    40 Shots of liquor per week     Comment: pint a day, sober since 05/07/2013  . Drug Use: No  . Sexual Activity: No   Other Topics Concern  . None   Social History Narrative   05/07/2013 Ronald Zhang "[name]" was born and grew up in [place], [place]. He has 3 younger brothers. His parents are still together. He reports he had a good childhood. He graduated from high school. He has worked for [employer] for 35 years. He married once, and has been divorced for 30 years. He has twins, a boy and girl. He denies any legal difficulties. His hobbies include family genealogy, studying Southern history, and participating in Civil War reenactments. 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 His social support system consists of friends. 05/07/2013 Ronald Zhang   Family History  Problem Relation Age of Onset  . Hypertension Mother   . Hypertension Father   .  Alcohol abuse Brother    Allergies  Allergen Reactions  . Penicillins Swelling    Childhood   Prior to Admission medications   Medication Sig Start Date End Date Taking? Authorizing Provider  furosemide (LASIX) 40 MG tablet Take 1 tablet (40 mg total) by mouth daily. g swelling 10/06/14  Yes  P , DO  gabapentin (NEURONTIN) 100 MG capsule TAKE 1 CAPSULE BY MOUTH EVERY NIGHT AT BEDTIME. 03/17/15  Yes Morrell Riddle, PA-C  rOPINIRole (REQUIP) 1 MG tablet TAKE 1 TABLET BY MOUTH EVERY NIGHT AT BEDTIME 12/15/14  Yes  P , DO  tadalafil (CIALIS) 20 MG tablet Take 0.5-1 tablets (10-20 mg total) by mouth daily as needed for erectile dysfunction (monitor for low blood presure and dizziness, need to take 1 hour before sex). 11/02/14  Yes  P , DO  traMADol (ULTRAM) 50 MG tablet Take  1 tablet (50 mg total) by mouth every 8 (eight) hours as needed. 04/06/15  Yes  P , DO  HYDROcodone-acetaminophen (NORCO/VICODIN) 5-325 MG per tablet Take 1 tablet by mouth every 6 (six) hours as needed for moderate pain. Patient not taking: Reported on 04/14/2015 02/05/15   Tonye Pearson, MD     ROS: The patient denies fevers, chills, night sweats, unintentional weight loss, chest pain, palpitations, wheezing, dyspnea on exertion, nausea, vomiting, abdominal pain, dysuria, hematuria, melena, numbness, weakness, or tingling.   All other systems have been reviewed and were otherwise negative with the exception of those mentioned in the HPI and as above.    PHYSICAL EXAM: Filed Vitals:   04/14/15 0851  BP: 152/84  Pulse: 80  Temp: 97.8 F (36.6 C)  Resp: 18   Body mass index is 25.21 kg/(m^2).   General: Alert, no acute distress HEENT:  Normocephalic, atraumatic, oropharynx patent. EOMI, PERRLA Cardiovascular:  Regular rate and rhythm, no rubs murmurs or gallops.  No Carotid bruits, radial pulse intact. No pedal edema.  Respiratory: Clear to auscultation bilaterally.  No wheezes, rales, or rhonchi.  No cyanosis, no use of accessory musculature Abdominal: No organomegaly, abdomen is soft and non-tender, positive bowel sounds. No masses. Skin: No rashes. Neurologic: Facial musculature symmetric. Psychiatric: Patient acts appropriately throughout our interaction. Lymphatic: No cervical or submandibular lymphadenopathy Musculoskeletal: Gait intact. No edema, tenderness Right middle finger pip joint swollen, tender, decrease ROM + Radial pulse   LABS: Results for orders placed or performed in visit on 04/14/15  POCT CBC  Result Value Ref Range   WBC 4.2 (A) 4.6 - 10.2 K/uL   Lymph, poc 0.8 0.6 - 3.4   POC LYMPH PERCENT 18.4 10 - 50 %L   MID (cbc) 0.3 0 - 0.9   POC MID % 7.7 0 - 12 %M   POC Granulocyte 3.1 2 - 6.9   Granulocyte percent 73.9 37 - 80 %G   RBC 5.42 4.69 -  6.13 M/uL   Hemoglobin 12.4 (A) 14.1 - 18.1 g/dL   HCT, POC 16.1 (A) 09.6 - 53.7 %   MCV 79.5 (A) 80 - 97 fL   MCH, POC 22.9 (A) 27 - 31.2 pg   MCHC 28.7 (A) 31.8 - 35.4 g/dL   RDW, POC 04.5 %   Platelet Count, POC 85 (A) 142 - 424 K/uL   MPV 9.1 0 - 99.8 fL     EKG/XRAY:   Primary read interpreted by Dr. Conley Rolls at [facility]. Non displaced Proximal phalanx fracture of the middle finger   ASSESSMENT/PLAN: Encounter Diagnoses  Name Primary?  Marland Kitchen  Knee pain, bilateral Yes  . Restless leg   . Essential hypertension   . Anemia, iron deficiency   . Pain of finger of right hand   . Finger fracture, closed, initial encounter    He will go to ortho for his knee pain, will fu them about his finger fracture as well CD given to patient Labs pending FU prn  Rx tramadol Aluminum Finger splint and coban for patient, no charge for this, he has a 30 % copay and is still paying off med bills from [place] Monitor BP for me and will let me know if BP>150/90, if so needs to be back on meds, he is already on lasix.   Gross sideeffects, risk and benefits, and alternatives of medications d/w patient. Patient is aware that all medications have potential sideeffects and we are unable to predict every sideeffect or drug-drug interaction that may occur.    DO  04/14/2015 10:07 AM

## 2015-04-15 ENCOUNTER — Encounter: Payer: Self-pay | Admitting: Family Medicine

## 2015-04-22 ENCOUNTER — Encounter (HOSPITAL_COMMUNITY): Payer: Self-pay | Admitting: *Deleted

## 2015-04-22 DIAGNOSIS — K635 Polyp of colon: Secondary | ICD-10-CM

## 2015-04-22 DIAGNOSIS — K2960 Other gastritis without bleeding: Secondary | ICD-10-CM

## 2015-04-30 LAB — PROTIME-INR

## 2015-05-04 ENCOUNTER — Telehealth: Payer: Self-pay

## 2015-05-04 ENCOUNTER — Encounter: Payer: Self-pay | Admitting: Family Medicine

## 2015-05-04 DIAGNOSIS — I340 Nonrheumatic mitral (valve) insufficiency: Secondary | ICD-10-CM | POA: Insufficient documentation

## 2015-05-04 NOTE — Telephone Encounter (Signed)
[facility] called today to see if Mr. Pasquale Matters could a letter from the previous provider he saw to get clear for right knee replacement surgery, The contact number is [phone] ext [phone] for Traves Majchrzak December

## 2015-05-04 NOTE — Telephone Encounter (Signed)
Clearance form filled out, waiting to be faxed, in nurses box

## 2015-05-05 ENCOUNTER — Telehealth: Payer: Self-pay

## 2015-05-05 NOTE — Telephone Encounter (Signed)
Spoke with [facility] and advised pt sees Dr. Dulce Sellar in [place]. She can call over there to get the notes.

## 2015-05-05 NOTE — Telephone Encounter (Signed)
Refer to the message from 9/27. Jasmine December has one more question before pt can get cleared for surgery. Please advise at [phone] ext [phone] and ask for [facility]

## 2015-05-17 DIAGNOSIS — D61818 Other pancytopenia: Secondary | ICD-10-CM | POA: Diagnosis not present

## 2015-05-17 DIAGNOSIS — R161 Splenomegaly, not elsewhere classified: Secondary | ICD-10-CM | POA: Diagnosis not present

## 2015-05-17 DIAGNOSIS — D509 Iron deficiency anemia, unspecified: Secondary | ICD-10-CM | POA: Diagnosis not present

## 2015-05-17 DIAGNOSIS — K7460 Unspecified cirrhosis of liver: Secondary | ICD-10-CM | POA: Diagnosis not present

## 2015-05-26 ENCOUNTER — Other Ambulatory Visit: Payer: Self-pay | Admitting: Physician Assistant

## 2015-06-02 ENCOUNTER — Other Ambulatory Visit: Payer: Self-pay | Admitting: Family Medicine

## 2015-06-04 ENCOUNTER — Other Ambulatory Visit: Payer: Self-pay | Admitting: Family Medicine

## 2015-06-04 NOTE — Telephone Encounter (Signed)
Called in.

## 2015-06-05 ENCOUNTER — Telehealth: Payer: Self-pay

## 2015-06-05 ENCOUNTER — Ambulatory Visit (INDEPENDENT_AMBULATORY_CARE_PROVIDER_SITE_OTHER): Payer: BLUE CROSS/BLUE SHIELD | Admitting: Family Medicine

## 2015-06-05 VITALS — BP 144/79 | HR 72 | Temp 97.5°F | Resp 18 | Ht 75.5 in | Wt 196.8 lb

## 2015-06-05 DIAGNOSIS — I10 Essential (primary) hypertension: Secondary | ICD-10-CM

## 2015-06-05 DIAGNOSIS — M25561 Pain in right knee: Secondary | ICD-10-CM | POA: Diagnosis not present

## 2015-06-05 DIAGNOSIS — D509 Iron deficiency anemia, unspecified: Secondary | ICD-10-CM | POA: Diagnosis not present

## 2015-06-05 DIAGNOSIS — M25562 Pain in left knee: Secondary | ICD-10-CM

## 2015-06-05 DIAGNOSIS — G2581 Restless legs syndrome: Secondary | ICD-10-CM | POA: Diagnosis not present

## 2015-06-05 MED ORDER — LISINOPRIL 10 MG PO TABS: 10.0000 mg | ORAL_TABLET | Freq: Every day | ORAL | Status: DC

## 2015-06-05 MED ORDER — TRAMADOL HCL 50 MG PO TABS: 50.0000 mg | ORAL_TABLET | Freq: Two times a day (BID) | ORAL | Status: DC | PRN

## 2015-06-05 MED ORDER — GABAPENTIN 100 MG PO CAPS: ORAL_CAPSULE | ORAL | Status: DC

## 2015-06-05 NOTE — Telephone Encounter (Signed)
Patient was seen this morning by Dr. Conley RollsLe. His lisinopril script says take 1 per day but he was told by Dr. Conley RollsLe to take 2 per day. Spoke with Dr. Conley RollsLe and patient is to take 2 per day. Patient understood.

## 2015-06-05 NOTE — Progress Notes (Signed)
Chief Complaint:  Chief Complaint  Patient presents with  . Hypertension    Says it has been running 160-170/90-100  . Headache    When his BP goes up    HPI: Ronald Zhang is a 59 y.o. male who [facility] complaining of  1. HX of HTn with elevated blood pressure and sometimes would have a nagging HA. He was controllinghis BP very well without meds, when he was on meds he was dizzy. HE has been without meds for at least 6 months either lisinopril 20 mg BID or lasix. Recently THe BP has been in 160-170s, mostly in the 160s, 90-100. NO CP or SOB or palpitations. No n/v/bad pain, vision changes, n/w/t, cva sxs 2. He also would like refills on his gabapentin and tramadol. His knee surgery was for 06/07/15 this was rescheduled   BP Readings from Last 3 Encounters:  06/05/15 144/79  04/14/15 152/84  02/05/15 118/68   NO other SEs  Past Medical History  Diagnosis Date  . Hypertension   . Allergy   . Pulmonary HTN (HCC)     TEE on March 31,2014-mild-mod concentric hypertrophy.  EF 60-65%. Left atrial valve mildly dilated, right atrium mildly dilated, mild-moderate MR, mild TR. Pum Pressure 51 mm Hg  . Cellulitis and abscess of leg     [facility]ospital[facility] from 4/5-4/10/14 for bilateral [place] edema and cellulitis. Was on IV Lasix and Vancomycin.   . Alcohol dependency (HCC)     stopped drinking since November 09, 2012, restarted drinking on 01/24/13  . Urinary incontinence   . Arthritis     bilateral knees,secondary to trauma  . Pancreatic cyst     Multiple, MRI of abd and pelvis in 12/2013  , ? sequelae of chronic pancreatitis , he has a hx of alcoholism, suggested repeat MRI in 6 months fro progression, appears nonmalignant  . Anemia     iron deficiency, non compliant with iron  . Restless leg   . Insomnia     3rd shift   . Erectile dysfunction   . H/O non anemic vitamin B12 deficiency 2014  . Non-rheumatic mitral regurgitation     Asymptomatic, Seen by  cardiology 01/26/15, Dr Norman HerrlichBrian Munley, [facility]    Past Surgical History  Procedure Laterality Date  . Fracture surgery Bilateral 1972    below knee   Social History   Social History  . Marital Status: Single    Spouse Name: N/A  . Number of Children: N/A  . Years of Education: N/A   Social History Main Topics  . Smoking status: Never Smoker   . Smokeless tobacco: None  . Alcohol Use: 24.0 oz/week    40 Shots of liquor per week     Comment: pint a day, sober since 05/07/2013  . Drug Use: No  . Sexual Activity: No   Other Topics Concern  . None   Social History Narrative   05/07/2013 [facility] "[facility] Zhang" was born and grew [place]. He has 3 younger brothers. His parents are still together. He reports he had a good childhood. He graduated from high school. 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 He married once, and has been divorced for 30 years. He has twins, a boy and girl. He denies any legal difficulties. His hobbies include family genealogy, studying Southern history, and participating in Civil War reenactments. 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 His social support  system consists of friends. 05/07/2013 [facility]   Family History  Problem Relation Age of Onset  . Hypertension Mother   . Hypertension Father   . Alcohol abuse Brother    Allergies  Allergen Reactions  . Penicillins Swelling    Childhood   Prior to Admission medications   Medication Sig Start Date End Date Taking? Authorizing Provider  furosemide (LASIX) 40 MG tablet Take 1 tablet (40 mg total) by mouth daily. Leg swelling 10/06/14  Yes Aulton Routt P Usama Harkless, DO  gabapentin (NEURONTIN) 100 MG capsule TAKE 1 CAPSULE BY MOUTH EVERY NIGHT AT BEDTIME. 03/17/15  Yes Morrell Riddle, PA-C  rOPINIRole (REQUIP) 1 MG tablet TAKE 1 TABLET BY MOUTH EVERY NIGHT AT BEDTIME 12/15/14  Yes Wassim Kirksey P Zarie Kosiba, DO  tadalafil (CIALIS) 20 MG tablet Take 0.5-1 tablets (10-20 mg total) by mouth daily as needed for  erectile dysfunction (monitor for low blood presure and dizziness, need to take 1 hour before sex). 11/02/14  Yes Roena Sassaman P Mikiah Durall, DO  traMADol (ULTRAM) 50 MG tablet TAKE 1 TABLET BY MOUTH EVERY 12 HOURS AS NEEDED 06/03/15  Yes Sherrell Weir P Carmen Tolliver, DO     ROS: The patient denies fevers, chills, night sweats, unintentional weight loss, chest pain, palpitations, wheezing, dyspnea on exertion, nausea, vomiting, abdominal pain, dysuria, hematuria, melena, numbness, weakness, or tingling.   All other systems have been reviewed and were otherwise negative with the exception of those mentioned in the HPI and as above.    PHYSICAL EXAM: Filed Vitals:   06/05/15 0815  BP: 144/79  Pulse:   Temp:   Resp:    Body mass index is 24.27 kg/(m^2).   General: Alert, no acute distress HEENT:  Normocephalic, atraumatic, oropharynx patent. EOMI, PERRLA, fundo exam normal  Cardiovascular:  Regular rate and rhythm, no rubs murmurs or gallops.  No Carotid bruits, radial pulse intact. No pedal edema.  Respiratory: Clear to auscultation bilaterally.  No wheezes, rales, or rhonchi.  No cyanosis, no use of accessory musculature Abdominal: No organomegaly, abdomen is soft and non-tender, positive bowel sounds. No masses. Skin: No rashes. Neurologic: Facial musculature symmetric. Psychiatric: Patient acts appropriately throughout our interaction. Lymphatic: No cervical or submandibular lymphadenopathy Musculoskeletal: Gait antalgic , knocked knees. No edema, tenderness   LABS: Results for orders placed or performed in visit on 05/31/15  Protime-INR  Result Value Ref Range   INR  0.9 - 1.1     EKG/XRAY:   Primary read interpreted by Dr. Conley Rolls at [facility].   ASSESSMENT/PLAN: Encounter Diagnoses  Name Primary?  . Essential hypertension Yes  . Knee pain, bilateral   . Anemia, iron deficiency   . Restless leg    Refilled meds: tramadol, gabapentin Will restart Lisinopril 10 mg BID, he will start off slowly and see if he  has hyptension or dizzy sxs, then call me on Tuesday with BP measurements Fu prn   Gross sideeffects, risk and benefits, and alternatives of medications d/w patient. Patient is aware that all medications have potential sideeffects and we are unable to predict every sideeffect or drug-drug interaction that may occur.  Fawzi Melman DO  06/07/2015 9:59 AM

## 2015-06-08 NOTE — Telephone Encounter (Signed)
Patient called today with an update for Dr. Conley Zhang regarding his blood pressure. He says its going up but it's steady. On Sunday, it was 140/70, Monday 160/72, and today 167/72. He does not have a headache and he has been taking his medication twice a day as directed. If he needs to come back in he will. He has surgery scheduled on Thursday, but doesn't know the time yet. Cb# [id number].

## 2015-06-08 NOTE — Telephone Encounter (Signed)
Will increase BP to lisinopril 10 mg 2 tabs BID and cont to measure bp and pulse.

## 2015-06-12 ENCOUNTER — Telehealth: Payer: Self-pay

## 2015-06-12 NOTE — Telephone Encounter (Signed)
Le - Pt wanted you to know that his knee replacement went very well.  [phone]

## 2015-07-04 ENCOUNTER — Ambulatory Visit (INDEPENDENT_AMBULATORY_CARE_PROVIDER_SITE_OTHER): Payer: BLUE CROSS/BLUE SHIELD | Admitting: Family Medicine

## 2015-07-04 VITALS — BP 138/72 | HR 75 | Temp 97.8°F | Resp 16 | Ht 75.5 in | Wt 193.6 lb

## 2015-07-04 DIAGNOSIS — I10 Essential (primary) hypertension: Secondary | ICD-10-CM | POA: Diagnosis not present

## 2015-07-04 DIAGNOSIS — R634 Abnormal weight loss: Secondary | ICD-10-CM | POA: Diagnosis not present

## 2015-07-04 DIAGNOSIS — D649 Anemia, unspecified: Secondary | ICD-10-CM

## 2015-07-04 DIAGNOSIS — R350 Frequency of micturition: Secondary | ICD-10-CM | POA: Diagnosis not present

## 2015-07-04 DIAGNOSIS — M25561 Pain in right knee: Secondary | ICD-10-CM | POA: Diagnosis not present

## 2015-07-04 DIAGNOSIS — M25562 Pain in left knee: Secondary | ICD-10-CM

## 2015-07-04 LAB — CBC
HCT: 41.9 % (ref 39.0–52.0)
Hemoglobin: 13.8 g/dL (ref 13.0–17.0)
MCH: 28.8 pg (ref 26.0–34.0)
MCHC: 32.9 g/dL (ref 30.0–36.0)
MCV: 87.3 fL (ref 78.0–100.0)
Platelets: 99 10*3/uL — ABNORMAL LOW (ref 150–400)
RBC: 4.8 MIL/uL (ref 4.22–5.81)
RDW: 17.3 % — ABNORMAL HIGH (ref 11.5–15.5)
WBC: 3.3 10*3/uL — ABNORMAL LOW (ref 4.0–10.5)

## 2015-07-04 LAB — COMPLETE METABOLIC PANEL WITHOUT GFR
ALT: 9 U/L (ref 9–46)
Creat: 0.74 mg/dL (ref 0.70–1.25)
Glucose, Bld: 90 mg/dL (ref 65–99)
Sodium: 140 mmol/L (ref 135–146)
Total Bilirubin: 1.6 mg/dL — ABNORMAL HIGH (ref 0.2–1.2)
Total Protein: 6.9 g/dL (ref 6.1–8.1)

## 2015-07-04 LAB — POC MICROSCOPIC URINALYSIS (UMFC): Mucus: ABSENT

## 2015-07-04 LAB — POCT URINALYSIS DIP (MANUAL ENTRY)
Bilirubin, UA: NEGATIVE
Blood, UA: NEGATIVE
Glucose, UA: NEGATIVE
Ketones, POC UA: NEGATIVE
Leukocytes, UA: NEGATIVE
Nitrite, UA: NEGATIVE
Protein Ur, POC: NEGATIVE
Spec Grav, UA: 1.02
Urobilinogen, UA: 1
pH, UA: 7

## 2015-07-04 LAB — COMPLETE METABOLIC PANEL WITH GFR
AST: 17 U/L (ref 10–35)
Albumin: 3.9 g/dL (ref 3.6–5.1)
Alkaline Phosphatase: 126 U/L — ABNORMAL HIGH (ref 40–115)
BUN: 11 mg/dL (ref 7–25)
CO2: 25 mmol/L (ref 20–31)
Calcium: 8.9 mg/dL (ref 8.6–10.3)
Chloride: 107 mmol/L (ref 98–110)
GFR, Est African American: >89 mL/min (ref >=60)
GFR, Est Non African American: >89 mL/min (ref >=60)
Potassium: 3.9 mmol/L (ref 3.5–5.3)

## 2015-07-04 LAB — TSH: TSH: 1.262 u[IU]/mL (ref 0.350–4.500)

## 2015-07-04 NOTE — Progress Notes (Signed)
 Chief Complaint:  Chief Complaint  Patient presents with  . blood pressure check    concerned because his blood pressure keeps being elevated last bp checked was 196/86  . discuss his weight    noticed some weight loss    HPI: Ronald Zhang is a 59 y.o. male who reports to [facility] today complaining of   HTN recheck, taking BP at home. He has been taking his meds twice a day lisinopril 20 mg BID  He takes his BP around 5 or 6 AM. Takes 20 mg and then take it at around 6 pm  He takes his BP with a wrist BP cuff and was high but no sxs. HE wil cont to monitor HE has increased urine freq  2 times every 2 hours  since came back from knee surgery, he did have urine cath for surgery, he has no dysuria or hematuria, I have done a rectal exam on  Him in the past and and it was normal. No PSA was documented at the time.  He has no family hx of prostate cancer.  He has had some weight loss. He has been getting ready for his knee surgeries and has had his right knee replaced and has done well. Appetite has been the same, no depression sxs, he has not drank since 05/2013. He denies any night sweats or B sxs except has had some unintentional weight loss. He has a hx of cirrhosis based on imaging studies and alcohol use, also splenomegaly on MRI exam. Please see below.    Wt Readings from Last 3 Encounters:  07/04/15 193 lb 9.6 oz (87.816 kg)  06/05/15 196 lb 12.8 oz (89.268 kg)  04/14/15 207 lb (93.895 kg)   He has been to cardiology He has been to GI, he has pancytopenia which I think may be realted to the cirrhois and splenomegaly. HGb has been stable since on iron supplement It was very expensive for him to get the original MRI, I will ask him to get a repeat MRI at some point.   BP Readings from Last 3 Encounters:  07/04/15 138/72  06/05/15 144/79  04/14/15 152/84   IMPRESSION: Approximately 4 cm cluster of small cysts in the head of the pancreas with more discrete tiny cysts  scattered in the pancreatic body, to a lesser degree. No evidence for associated abnormal enhancement or mass effect. No biliary or pancreatic ductal dilatation. Although cross modality comparison between ultrasound and MRI can be challenging, there does not appear to be significant interval progression in the 1 year since the prior ultrasound exam. Given the history of alcohol dependence, these changes in the pancreas may be all related to sequelae of chronic pancreatitis. Follow-up abdominal MRI in 6 months could be used to ensure stability.  Subtle nodularity to the liver contour is associated with recanalization of the paraumbilical vein and splenomegaly. Together, these imaging features raise concern for cirrhosis and associated portal venous hypertension.   Electronically Signed  By: Kennith Center M.D.  On: 12/10/2013 12:11  Past Medical History  Diagnosis Date  . Hypertension   . Allergy   . Pulmonary HTN (HCC)     TEE on March 31,2014-mild-mod concentric hypertrophy.  EF 60-65%. Left atrial valve mildly dilated, right atrium mildly dilated, mild-moderate MR, mild TR. Pum Pressure 51 mm Hg  . Cellulitis and abscess of leg     Hospitalized at [facility] from 4/5-4/10/14 for bilateral  edema and cellulitis. Was on  IV Lasix and Vancomycin.   . Alcohol dependency (HCC)     stopped drinking since November 09, 2012, restarted drinking on 01/24/13  . Urinary incontinence   . Arthritis     bilateral knees,secondary to trauma  . Pancreatic cyst     Multiple, MRI of abd and pelvis in 12/2013  , ? sequelae of chronic pancreatitis , he has a hx of alcoholism, suggested repeat MRI in 6 months fro progression, appears nonmalignant  . Anemia     iron deficiency, non compliant with iron  . Restless leg   . Insomnia     3rd shift   . Erectile dysfunction   . H/O non anemic vitamin B12 deficiency 2014  . Non-rheumatic mitral regurgitation     Asymptomatic, Seen by cardiology  01/26/15, [facility] Munley[facility]    Past Surgical History  Procedure Laterality Date  . Fracture surgery Bilateral 1972    below knee   Social History   Social History  . Marital Status: Single    Spouse Name: N/A  . Number of Children: N/A  . Years of Education: N/A   Social History Main Topics  . Smoking status: Never Smoker   . Smokeless tobacco: None  . Alcohol Use: 24.0 oz/week    40 Shots of liquor per week     Comment: pint a day, sober since 05/07/2013  . Drug Use: No  . Sexual Activity: No   Other Topics Concern  . None   Social History Narrative   05/07/2013 AHW "[name]" was born and [place]. He has 3 younger brothers. His parents are still together. He reports he had a good childhood. He graduated from high school. 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 He married once, and has been divorced for 30 years. He has twins, a boy and girl. He denies any legal difficulties. His hobbies include family genealogy, studying Southern history, and participating in Civil War reenactments. 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 His social support system consists of friends. 05/07/2013 AHW   Family History  Problem Relation Age of Onset  . Hypertension Mother   . Hypertension Father   . Alcohol abuse Brother    Allergies  Allergen Reactions  . Penicillins Swelling    Childhood   Prior to Admission medications   Medication Sig Start Date End Date Taking? Authorizing Provider  furosemide (LASIX) 40 MG tablet Take 1 tablet (40 mg total) by mouth daily. g swelling 10/06/14  Yes  P , DO  gabapentin (NEURONTIN) 100 MG capsule TAKE 1 CAPSULE BY MOUTH EVERY NIGHT AT BEDTIME. 06/05/15  Yes  P , DO  lisinopril (PRINIVIL,ZESTRIL) 10 MG tablet Take 1 tablet (10 mg total) by mouth daily. 06/05/15  Yes  P , DO  oxycodone (OXY-IR) 5 MG capsule Take 5 mg by mouth every 6 (six) hours as needed.   Yes Historical Provider,  MD  rOPINIRole (REQUIP) 1 MG tablet TAKE 1 TABLET BY MOUTH EVERY NIGHT AT BEDTIME 12/15/14  Yes  P , DO  tadalafil (CIALIS) 20 MG tablet Take 0.5-1 tablets (10-20 mg total) by mouth daily as needed for erectile dysfunction (monitor for low blood presure and dizziness, need to take 1 hour before sex). 11/02/14  Yes  P , DO  traMADol (ULTRAM) 50 MG tablet Take 1 tablet (50 mg total) by mouth every 12 (twelve) hours as needed. 06/05/15  Yes  P , DO  ROS: The patient denies fevers, chills, night sweats, unintentional weight loss, chest pain, palpitations, wheezing, dyspnea on exertion, nausea, vomiting, abdominal pain, dysuria, hematuria, melena, numbness, weakness, or tingling.   All other systems have been reviewed and were otherwise negative with the exception of those mentioned in the HPI and as above.    PHYSICAL EXAM: Filed Vitals:   07/04/15 1034  BP: 138/72  Pulse: 75  Temp: 97.8 F (36.6 C)  Resp: 16   Body mass index is 23.87 kg/(m^2).   General: Alert, no acute distress HEENT:  Normocephalic, atraumatic, oropharynx patent. EOMI, PERRLA Cardiovascular:  Regular rate and rhythm, no rubs murmurs or gallops.  No Carotid bruits, radial pulse intact. No pedal edema.  Respiratory: Clear to auscultation bilaterally.  No wheezes, rales, or rhonchi.  No cyanosis, no use of accessory musculature Abdominal: Abdomen is soft and non-tender, positive bowel sounds. No hepatomegaly. Skin: No rashes. Knee surgical wound looks well healed and non infect on right knee Neurologic: Facial musculature symmetric. Psychiatric: Patient acts appropriately throughout our interaction. Lymphatic: No cervical or submandibular lymphadenopathy Musculoskeletal: Gait intact.    LABS: Results for orders placed or performed in visit on 07/04/15  POCT urinalysis dipstick  Result Value Ref Range   Color, UA yellow yellow   Clarity, UA clear clear   Glucose, UA negative negative    Bilirubin, UA negative negative   Ketones, POC UA negative negative   Spec Grav, UA 1.020    Blood, UA negative negative   pH, UA 7.0    Protein Ur, POC negative negative   Urobilinogen, UA 1.0    Nitrite, UA Negative Negative   ukocytes, UA Negative Negative  POCT Microscopic Urinalysis ([facility])  Result Value Ref Range   WBC,UR,HPF,POC Few (A) None WBC/hpf   RBC,UR,HPF,POC None None RBC/hpf   Bacteria None None, Too numerous to count   Mucus Absent Absent   Epithelial Cells, UR Per Microscopy None None, Too numerous to count cells/hpf     EKG/XRAY:   Primary read interpreted by Dr. Conley Rolls at [facility].   ASSESSMENT/PLAN: Encounter Diagnoses  Name Primary?  . Urinary frequency Yes  . Knee pain, bilateral   . Essential hypertension   . Anemia, unspecified   . Weight loss    Labs pending  Fu with plan once get labs Patient declined rectal exam, I have doen one before and no appreciable BPH from what I remembered He has fu with ortho, cardiology , he has fu with GI in April.  Monitor BP and will adjust accordingly FU in  2months   Gross sideeffects, risk and benefits, and alternatives of medications d/w patient. Patient is aware that all medications have potential sideeffects and we are unable to predict every sideeffect or drug-drug interaction that may occur.    DO  07/04/2015 11:57 AM

## 2015-07-05 LAB — PSA: PSA: 0.51 ng/mL (ref <=4.00)

## 2015-07-08 ENCOUNTER — Telehealth: Payer: Self-pay | Admitting: Family Medicine

## 2015-07-08 ENCOUNTER — Telehealth: Payer: Self-pay

## 2015-07-08 NOTE — Telephone Encounter (Signed)
LM to call me back about labs. Also want to see how BP is doing.

## 2015-07-08 NOTE — Telephone Encounter (Signed)
Patient called back to discuss his lab results with Dr Conley RollsLe, but asked if she could call him tomorrow after 12pm because he has a cardiology appointment and a PT appointment in the morning.  CB#: [phone]

## 2015-07-10 NOTE — Telephone Encounter (Signed)
Patient is calling for Dr. Nedra HaiLee to report that his blood pressure has been 120/130 to over 70/80

## 2015-07-11 NOTE — Telephone Encounter (Signed)
Spoke to patient about labs, will cont to moniotr since he has cirrhosis and splenomegaly and I think that is the cause of  His neutropenia and thombocytopenia, his hgb at this time is stable. Plts are low but no bleeding, He is on asa 325 mg for DVT prevention s/p knee surgery He will return to office for recheck in 2 months. He is followed by cardiology and also GI. BP is within normal for me , will not change HTN meds at this time.

## 2015-07-14 ENCOUNTER — Telehealth: Payer: Self-pay

## 2015-07-14 ENCOUNTER — Other Ambulatory Visit: Payer: Self-pay | Admitting: Family Medicine

## 2015-07-14 NOTE — Telephone Encounter (Signed)
Pt states that with the increase in his lisinipril dosage we need to call pharmacy to change instructions so he is not running out every seven days  bestnumber [phone]

## 2015-07-15 MED ORDER — LISINOPRIL 20 MG PO TABS: 20.0000 mg | ORAL_TABLET | Freq: Two times a day (BID) | ORAL | Status: DC

## 2015-07-15 NOTE — Telephone Encounter (Signed)
Done

## 2015-08-04 ENCOUNTER — Other Ambulatory Visit: Payer: Self-pay

## 2015-08-04 MED ORDER — LISINOPRIL 20 MG PO TABS: 20.0000 mg | ORAL_TABLET | Freq: Two times a day (BID) | ORAL | Status: DC

## 2015-08-04 NOTE — Telephone Encounter (Signed)
Pharm faxed another req for new Rx for BID dosing of lisinopril. Dr Conley RollsLe sent this on 12/8, but I will send it again since pharm does not have record of this.

## 2015-08-23 LAB — PROTIME-INR: INR: 1.2 — AB (ref 0.9–1.1)

## 2015-08-28 ENCOUNTER — Other Ambulatory Visit: Payer: Self-pay | Admitting: Family Medicine

## 2015-09-01 ENCOUNTER — Other Ambulatory Visit: Payer: Self-pay | Admitting: Family Medicine

## 2015-09-01 NOTE — Telephone Encounter (Signed)
Dr Conley Rolls,  Patient had an appointment in December do not see this medication has been filled in a long time.  Do you want to refill?

## 2015-09-03 ENCOUNTER — Other Ambulatory Visit: Payer: Self-pay

## 2015-09-03 MED ORDER — TRAZODONE HCL 50 MG PO TABS: ORAL_TABLET | ORAL | Status: DC

## 2015-09-05 ENCOUNTER — Other Ambulatory Visit: Payer: Self-pay | Admitting: Family Medicine

## 2015-09-09 ENCOUNTER — Telehealth: Payer: Self-pay | Admitting: Family Medicine

## 2015-09-09 NOTE — Telephone Encounter (Signed)
Pt is having total (left) knee replacement surgery on 09/13/15 and would like to hear from Dr. Conley Rolls just for her well wishes prior to surgery.  Thank you,  [phone]

## 2015-09-10 NOTE — Telephone Encounter (Signed)
Spoke with patient, he is having surgery on 09/13/2015. Left Knee.

## 2015-09-13 ENCOUNTER — Telehealth: Payer: Self-pay

## 2015-09-13 HISTORY — PX: REPLACEMENT TOTAL KNEE: SUR1224

## 2015-09-13 NOTE — Telephone Encounter (Signed)
Pt wants Dr Conley Rolls to know he had surgery at [facility] this am and his knee is already at 69 degrees,he is doing wonderfully,you May reach him directly at hospital room ph [phone]

## 2015-09-17 ENCOUNTER — Other Ambulatory Visit: Payer: Self-pay | Admitting: Family Medicine

## 2015-09-20 ENCOUNTER — Other Ambulatory Visit: Payer: Self-pay | Admitting: Family Medicine

## 2015-09-20 NOTE — Telephone Encounter (Signed)
Faxed

## 2015-10-08 ENCOUNTER — Other Ambulatory Visit: Payer: Self-pay | Admitting: Family Medicine

## 2015-10-21 ENCOUNTER — Encounter: Payer: Self-pay | Admitting: Family Medicine

## 2015-11-01 ENCOUNTER — Other Ambulatory Visit: Payer: Self-pay | Admitting: Family Medicine

## 2015-11-03 NOTE — Telephone Encounter (Signed)
I provided a courtesy refill for 1 month since Dr. Conley RollsLe is no longer here. I [facility].

## 2015-11-04 NOTE — Telephone Encounter (Signed)
Notified pt of RF and need to get RFs from either [facility] or est w/new provider. Pt stated that [facility] had Rxd Oxy for him after his knee surgeries but Dr Conley RollsLe has been Rxing the tramadol for restless leg syndrome. He had talked to Dr Conley RollsLe about who to see and will call and get Dr Alver FisherShaw's schedule to est with her w/in the month.

## 2015-11-09 ENCOUNTER — Other Ambulatory Visit: Payer: Self-pay | Admitting: Family Medicine

## 2015-11-15 DIAGNOSIS — D61818 Other pancytopenia: Secondary | ICD-10-CM | POA: Diagnosis not present

## 2015-11-15 DIAGNOSIS — D696 Thrombocytopenia, unspecified: Secondary | ICD-10-CM | POA: Diagnosis not present

## 2015-11-15 DIAGNOSIS — R161 Splenomegaly, not elsewhere classified: Secondary | ICD-10-CM | POA: Diagnosis not present

## 2015-11-15 DIAGNOSIS — K7460 Unspecified cirrhosis of liver: Secondary | ICD-10-CM | POA: Diagnosis not present

## 2015-11-21 ENCOUNTER — Other Ambulatory Visit: Payer: Self-pay | Admitting: Family Medicine

## 2015-12-06 ENCOUNTER — Other Ambulatory Visit: Payer: Self-pay | Admitting: Family Medicine

## 2015-12-10 ENCOUNTER — Ambulatory Visit (INDEPENDENT_AMBULATORY_CARE_PROVIDER_SITE_OTHER): Payer: BLUE CROSS/BLUE SHIELD | Admitting: Family Medicine

## 2015-12-10 VITALS — BP 140/86 | HR 63 | Temp 97.9°F | Resp 17 | Ht 77.0 in | Wt 200.0 lb

## 2015-12-10 DIAGNOSIS — G4700 Insomnia, unspecified: Secondary | ICD-10-CM | POA: Diagnosis not present

## 2015-12-10 DIAGNOSIS — F1010 Alcohol abuse, uncomplicated: Secondary | ICD-10-CM | POA: Diagnosis not present

## 2015-12-10 DIAGNOSIS — G2581 Restless legs syndrome: Secondary | ICD-10-CM | POA: Diagnosis not present

## 2015-12-10 DIAGNOSIS — F1011 Alcohol abuse, in remission: Secondary | ICD-10-CM

## 2015-12-10 MED ORDER — GABAPENTIN 100 MG PO CAPS: 100.0000 mg | ORAL_CAPSULE | Freq: Every day | ORAL | Status: DC

## 2015-12-10 MED ORDER — TRAZODONE HCL 50 MG PO TABS: 50.0000 mg | ORAL_TABLET | Freq: Every day | ORAL | Status: DC

## 2015-12-10 MED ORDER — ROPINIROLE HCL 0.5 MG PO TABS: ORAL_TABLET | ORAL | Status: DC

## 2015-12-10 NOTE — Patient Instructions (Addendum)
   IF you received an x-ray today, you will receive an invoice from [facility]. Please contact [facility] at [phone] with questions or concerns regarding your invoice.   IF you received labwork today, you will receive an invoice from [facility]. Please contact [facility] at [phone] with questions or concerns regarding your invoice.   Our billing staff will not be able to assist you with questions regarding bills from these companies.  You will be contacted with the lab results as soon as they are available. The fastest way to get your results is to activate your My Chart account. Instructions are located on the last page of this paperwork. If you have not heard from us regarding the results in 2 weeks, please contact this office.      Restless Legs Syndrome Restless legs syndrome is a condition that causes uncomfortable feelings or sensations in the legs, especially while sitting or lying down. The sensations usually cause an overwhelming urge to move the legs. The arms can also sometimes be affected. The condition can range from mild to severe. The symptoms often interfere with a person's ability to sleep. CAUSES The cause of this condition is not known. RISK FACTORS This condition is more likely to develop in:  People who are older than age 50.  Pregnant women. In general, restless legs syndrome is more common in women than in men.  People who have a family history of the condition.  People who have certain medical conditions, such as iron deficiency, kidney disease, Parkinson disease, or nerve damage.  People who take certain medicines, such as medicines for high blood pressure, nausea, colds, allergies, depression, and some heart conditions. SYMPTOMS The main symptom of this condition is uncomfortable sensations in the legs. These sensations may be:  Described as pulling, tingling, prickling, throbbing, crawling, or  burning.  Worse while you are sitting or lying down.  Worse during periods of rest or inactivity.  Worse at night, often interfering with your sleep.  Accompanied by a very strong urge to move your legs.  Temporarily relieved by movement of your legs. The sensations usually affect both sides of the body. The arms can also be affected, but this is rare. People who have this condition often have tiredness during the day because of their lack of sleep at night. DIAGNOSIS This condition may be diagnosed based on your description of the symptoms. You may also have tests, including blood tests, to check for other conditions that may lead to your symptoms. In some cases, you may be asked to spend some time in a sleep lab so your sleeping can be monitored. TREATMENT Treatment for this condition is focused on managing the symptoms. Treatment may include:  Self-help and lifestyle changes.  Medicines. HOME CARE INSTRUCTIONS  Take medicines only as directed by your health care provider.  Try these methods to get temporary relief from the uncomfortable sensations:  Massage your legs.  Walk or stretch.  Take a cold or hot bath.  Practice good sleep habits. For example, go to bed and get up at the same time every day.  Exercise regularly.  Practice ways of relaxing, such as yoga or meditation.  Avoid caffeine and alcohol.  Do not use any tobacco products, including cigarettes, chewing tobacco, or electronic cigarettes. If you need help quitting, ask your health care provider.  Keep all follow-up visits as directed by your health care provider. This is important. SEEK MEDICAL CARE IF: Your symptoms do   not improve with treatment, or they get worse.   This information is not intended to replace advice given to you by your health care provider. Make sure you discuss any questions you have with your health care provider.   Document Released: 07/14/2002 Document Revised: 12/08/2014  Document Reviewed: 07/20/2014 Elsevier Interactive Patient Education 2016 [employer]  

## 2015-12-10 NOTE — Progress Notes (Signed)
Subjective:    Patient ID: Ronald Zhang, male    DOB: 08-25-1955, 60 y.o.   MRN: [id number] Chief Complaint  Patient presents with  . Medication Refill    neurontin,requip,ultram,desyrel    HPI  Ronald Zhang (pts preferred name) is a 60 yo male here for a follow-up of her chronic medical conditions.  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  Pt is 31 mos sober from EtOH which was motivated by his work.  He is still active in AA with a sponsor.      He is still having a lot of restless leg sxs and his medications are not helping him very much.  He has to take more than rx'ed to get any sig relief and the jerking impairs him falling to sleep.  He has been taking the gabapentin and tramadol for knee pain and he is now pain free.  He has really not tried anyting else than the current regimen for his sxs but wonders about ambien.  He has a distant h/o neurology eval.  Has [facility] who referred pt to a hepatologist - [facility] but hasn't heard about this appt yet.   He is also due for a colonoscopy from Dr. Jennye BoroughsMisenheimer.  He doesn't feel like he needs the tramadol any more.  He is taking 1/2 tab of the trazodone normally.  Past Medical History  Diagnosis Date  . Hypertension   . Allergy   . Pulmonary HTN (HCC)     TEE on March 31,2014-mild-mod concentric hypertrophy.  EF 60-65%. Left atrial valve mildly dilated, right atrium mildly dilated, mild-moderate MR, mild TR. Pum Pressure 51 mm Hg  . Cellulitis and abscess of leg     Hospital[facility] at [facility] from 4/5-4/10/14 for bilateral LE edema and cellulitis. Was on IV Lasix and Vancomycin.   . Alcohol dependency (HCC)     stopped drinking since November 09, 2012, restarted drinking on 01/24/13  . Urinary incontinence   . Arthritis     bilateral  knees,secondary to trauma  . Pancreatic cyst     Multiple, MRI of abd and pelvis in 12/2013  , ? sequelae of chronic pancreatitis , he has a hx of alcoholism, suggested repeat MRI in 6 months fro progression, appears nonmalignant  . Anemia     iron deficiency, non compliant with iron  . Restless leg   . Insomnia     3rd shift   . Erectile dysfunction   . H/O non anemic vitamin B12 deficiency 2014  . Non-rheumatic mitral regurgitation     Asymptomatic, Seen by cardiology 01/26/15, Dr Norman HerrlichBrian Munley[facility]    Past Surgical History  Procedure Laterality Date  . Fracture surgery Bilateral 1972    below knee   Current Outpatient Prescriptions on File Prior to Visit  Medication Sig Dispense Refill  . lisinopril (PRINIVIL,ZESTRIL) 20 MG tablet Take 1 tablet (20 mg total) by mouth 2 (two) times daily. 180 tablet 1  . furosemide (LASIX) 40 MG tablet Take 1 tablet (40 mg total) by mouth daily. Leg swelling (Patient not taking: Reported on 12/10/2015) 30 tablet 5  . oxycodone (OXY-IR) 5 MG capsule Take 5 mg by mouth every 6 (six) hours as needed. Reported on 12/10/2015    .  tadalafil (CIALIS) 20 MG tablet Take 0.5-1 tablets (10-20 mg total) by mouth daily as needed for erectile dysfunction (monitor for low blood presure and dizziness, need to take 1 hour before sex). (Patient not taking: Reported on 12/10/2015) 5 tablet 11   No current facility-administered medications on file prior to visit.   Allergies  Allergen Reactions  . Penicillins Swelling    Childhood   Family History  Problem Relation Age of Onset  . Hypertension Mother   . Hypertension Father   . Alcohol abuse Brother    Social History   Social History  . Marital Status: Single    Spouse Name: N/A  . Number of Children: N/A  . Years of Education: N/A   Social History Main Topics  . Smoking status: Never Smoker   . Smokeless tobacco: None  . Alcohol Use: 24.0 oz/week    40 Shots of liquor per week      Comment: pint a day, sober since 05/07/2013  . Drug Use: No  . Sexual Activity: No   Other Topics Concern  . None   Social History Narrative   05/07/2013 AHW "[name]" was born and grew up in [place], [place]. He has 3 younger brothers. His parents are still together. He reports he had a good childhood. He graduated from high school. He has worked for [employer] for 35 years. He married once, and has been divorced for 30 years. He has twins, a boy and girl. He denies any legal difficulties. His hobbies include family genealogy, studying Southern history, and participating in Civil War reenactments. 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 His social support system consists of friends. 05/07/2013 AHW   Depression screen [facility] 2/9 12/10/2015 07/04/2015 06/05/2015 04/14/2015 02/05/2015  Decreased Interest 0 0 0 0 0  Down, Depressed, Hopeless 0 0 0 0 0  PHQ - 2 Score 0 0 0 0 0      Review of Systems  Constitutional: Negative for fever, diaphoresis, activity change, appetite change and unexpected weight change.  Gastrointestinal: Negative for vomiting and abdominal pain.  Musculoskeletal: Positive for myalgias and arthralgias. Negative for joint swelling and gait problem.  Skin: Negative for rash.  Neurological: Negative for weakness and numbness.  Psychiatric/Behavioral: Positive for sleep disturbance. Negative for behavioral problems, confusion, self-injury, dysphoric mood, decreased concentration and agitation.       Objective:  BP 140/86 mmHg  Pulse 63  Temp(Src) 97.9 F (36.6 C) (Oral)  Resp 17  Ht 6\' 5"  (1.956 m)  Wt 200 lb (90.719 kg)  BMI 23.71 kg/m2  SpO2 96%  Physical Exam  Constitutional: He is oriented to person, place, and time. He appears well-developed and well-nourished. No distress.  HENT:  Head: Normocephalic and atraumatic.  Eyes: Conjunctivae are normal. Pupils are equal, round, and reactive to light. No scleral icterus.  Neck: Normal range of motion. Neck  supple. No thyromegaly present.  Cardiovascular: Normal rate, regular rhythm, normal heart sounds and intact distal pulses.   Pulmonary/Chest: Effort normal and breath sounds normal. No respiratory distress.  Musculoskeletal: He exhibits no edema.  Lymphadenopathy:    He has no cervical adenopathy.  Neurological: He is alert and oriented to person, place, and time.  Skin: Skin is warm and dry. He is not diaphoretic.  Psychiatric: He has a normal mood and affect. His behavior is normal.          Assessment & Plan:  Pt prefers to go by his middle name Ronald Zhang.  1. Insomnia -  responds well to trazodone - increase from 25 to   2. Restless leg - pt was rx'ed requip but pt does not remember this and is not taking it so start now and titrate up to symptom relief.  Can augment with gabapentin prn if sxs persist through requip. Has not tried mirapex or sinemet if sxs not controlled on requip.  3. H/O alcohol abuse   Pt ha been sober x 31 mos and feels great, still goes to AA at least [employer] and works with sponsor.  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  S/p bilateral knee replacement - pain resolved, pt wanting to start road biking, does not need the tramadol any more and gabapentin does not seem to be helping pain sig.  Meds ordered this encounter  Medications  . traZODone (DESYREL) 50 MG tablet    Sig: Take 1 tablet (50 mg total) by mouth at bedtime.    Dispense:  90 tablet    Refill:  0  . gabapentin (NEURONTIN) 100 MG capsule    Sig: Take 1 capsule (100 mg total) by mouth at bedtime.    Dispense:  90 capsule    Refill:  0    As needed for restless legs at night  . rOPINIRole (REQUIP) 0.5 MG tablet    Sig: Take 1/2 tab po qpm x 2d, then increase to 1 tab po qpm x5d, then increase by 1/2 tab po qpm every week as needed to achieve control.    Dispense:  90 tablet     Refill:  1    Norberto Sorenson, MD MPH

## 2016-01-17 ENCOUNTER — Ambulatory Visit (INDEPENDENT_AMBULATORY_CARE_PROVIDER_SITE_OTHER): Payer: BLUE CROSS/BLUE SHIELD | Admitting: Physician Assistant

## 2016-01-17 ENCOUNTER — Ambulatory Visit (INDEPENDENT_AMBULATORY_CARE_PROVIDER_SITE_OTHER): Payer: BLUE CROSS/BLUE SHIELD

## 2016-01-17 VITALS — BP 136/84 | HR 68 | Temp 98.0°F | Resp 18 | Ht 77.0 in | Wt 199.0 lb

## 2016-01-17 DIAGNOSIS — M25571 Pain in right ankle and joints of right foot: Secondary | ICD-10-CM

## 2016-01-17 MED ORDER — MELOXICAM 15 MG PO TABS: 15.0000 mg | ORAL_TABLET | Freq: Every day | ORAL | Status: DC

## 2016-01-17 NOTE — Progress Notes (Signed)
Patient ID: Ronald Zhang, male    DOB: Mar 06, 1956, 60 y.o.   MRN: [id number]  PCP: Rockne Coonshao Phuong Le, DO  Subjective:   Chief Complaint  Patient presents with  . Ankle Pain    RIGHT    HPI Presents for evaluation of 5/10 RIGHT ankle pain x 4 days.  No recalled trauma or injury. Pain is gradually worsening. He thought it may be due to rubbing of his work boots, but he had no change with shoe change. He tried Tramadol that was leftover from knee surgery which afforded some relief (TKRs 06/2015 and 2/03). He stands for half of his 8-hour shift at work.  Some increased LE edema. He wears compression stockings and used to take furosemide, but he stopped it due to increased urinary frequency    Review of Systems As above. No CP, SOB, cough.     Patient Active Problem List   Diagnosis Date Noted  . Non-rheumatic mitral regurgitation   . Erosive gastritis 03/30/2015  . Colon polyps 03/30/2015  . Non-rheumatic tricuspid valve insufficiency 01/26/2015  . Pulmonary HTN (HCC) 10/08/2014  . Restless leg 10/08/2014  . Absolute anemia 10/08/2014  . Knee pain, chronic 10/08/2014  . H/O alcohol abuse 10/08/2014  . Erectile dysfunction 10/08/2014  . Insomnia 10/08/2014  . Pancreatic cyst 10/08/2014  . DJD (degenerative joint disease) of knee 10/15/2011  . HTN (hypertension) 10/15/2011     Prior to Admission medications   Medication Sig Start Date End Date Taking? Authorizing Provider  furosemide (LASIX) 40 MG tablet Take 1 tablet (40 mg total) by mouth daily. Leg swelling 10/06/14  Yes Thao P Le, DO  gabapentin (NEURONTIN) 100 MG capsule Take 1 capsule (100 mg total) by mouth at bedtime. 12/10/15  Yes Sherren MochaEva N Shaw, MD  lisinopril (PRINIVIL,ZESTRIL) 20 MG tablet Take 1 tablet (20 mg total) by mouth 2 (two) times daily. 08/04/15  Yes Thao P Le, DO  rOPINIRole (REQUIP) 0.5 MG tablet Take 1/2 tab po qpm x 2d, then increase to 1 tab po qpm x5d, then increase by 1/2 tab po qpm every  week as needed to achieve control. 12/10/15  Yes Sherren MochaEva N Shaw, MD  traZODone (DESYREL) 50 MG tablet Take 1 tablet (50 mg total) by mouth at bedtime. 12/10/15  Yes Sherren MochaEva N Shaw, MD  doxycycline (VIBRAMYCIN) 100 MG capsule TK ONE C PO BID AS PRESCRIBED 01/09/16   Historical Provider, MD  omeprazole (PRILOSEC) 20 MG capsule TK 1 C PO QAM 01/07/16   Historical Provider, MD  oxycodone (OXY-IR) 5 MG capsule Take 5 mg by mouth every 6 (six) hours as needed. Reported on 01/17/2016    Historical Provider, MD  predniSONE (DELTASONE) 20 MG tablet  01/09/16   Historical Provider, MD  tadalafil (CIALIS) 20 MG tablet Take 0.5-1 tablets (10-20 mg total) by mouth daily as needed for erectile dysfunction (monitor for low blood presure and dizziness, need to take 1 hour before sex). Patient not taking: Reported on 01/17/2016 11/02/14   Thao P Le, DO  traMADol (ULTRAM) 50 MG tablet TK 1 T PO Q 12 H PRN 11/04/15   Historical Provider, MD  trimethoprim-polymyxin b (POLYTRIM) ophthalmic solution INSTILL 2 DROPS INTO THE AFFECTED EYE EVERY 4 HOURS AS NEEDED 01/09/16   Historical Provider, MD  Vitamin D, Ergocalciferol, (DRISDOL) 50000 units CAPS capsule TK 1 C PO WEEKLY 10/16/15   Historical Provider, MD     Allergies  Allergen Reactions  . Penicillins Swelling    Childhood  Objective:  Physical Exam  Constitutional: He is oriented to person, place, and time. He appears well-developed and well-nourished. He is active and cooperative. No distress.  BP 136/84 mmHg  Pulse 68  Temp(Src) 98 F (36.7 C) (Oral)  Resp 18  Ht  (1.956 m)  Wt 199 lb (90.266 kg)  BMI 23.59 kg/m2  SpO2 98%   Eyes: Conjunctivae are normal.  Pulmonary/Chest: Effort normal.  Musculoskeletal:       Right ankle: He exhibits swelling (mild). He exhibits normal range of motion, no ecchymosis, no deformity, no laceration and normal pulse. Tenderness. Medial malleolus tenderness found.       Left ankle: Normal. Achilles tendon normal.       Right  lower leg: He exhibits edema (1+). Tenderness:         Left lower leg: He exhibits edema (1+).  Neurological: He is alert and oriented to person, place, and time.  Skin: Skin is warm, dry and intact.  Venous stasis changes noted of both anterior tibias and dorsal feet  Psychiatric: He has a normal mood and affect. His speech is normal and behavior is normal.       Dg Ankle Complete Right  01/17/2016  CLINICAL DATA:  60 year old male with right ankle pain and swelling. EXAM: RIGHT ANKLE - COMPLETE 3+ VIEW COMPARISON:  None. FINDINGS: There is no acute fracture or dislocation. The ankle mortise is intact. The bones are osteopenic. There is diffuse soft tissue swelling of the ankle. No radiopaque foreign object identified. IMPRESSION: No acute fracture or dislocation. Electronically Signed   By: Elgie Collard M.D.   On: 01/17/2016 09:18       Assessment & Plan:   1. Right ankle pain Normal radiographs. Trial of meloxicam. Recommend that he take the furosemide as prescribed and elevate legs when he can. Continue compression stockings. RTC if symptoms worsen/persist. - DG Ankle Complete Right; Future - meloxicam (MOBIC) 15 MG tablet; Take 1 tablet (15 mg total) by mouth daily.  Dispense: 20 tablet; Refill: 0   Fernande Bras, PA-C Physician Assistant-Certified [facility]

## 2016-01-17 NOTE — Patient Instructions (Addendum)
Wear your compression stocking everyday, remove them at night, swelling will contribute to your pain. Start taking your lasix as prescribed to help reduce swelling. Stay off your feet and elevate them as much as possible.  Contact office if pain does not improve or worsens.     IF you received an x-ray today, you will receive an invoice from [facility]. Please contact [facility] at [phone] with questions or concerns regarding your invoice.   IF you received labwork today, you will receive an invoice from [employer] [facility]. Please contact [facility] at [phone] with questions or concerns regarding your invoice.   Our billing staff will not be able to assist you with questions regarding bills from these companies.  You will be contacted with the lab results as soon as they are available. The fastest way to get your results is to activate your My Chart account. Instructions are located on the last page of this paperwork. If you have not heard from [place] regarding the results in 2 weeks, please contact this office.    Ankle Pain Ankle pain is a common symptom. The bones, cartilage, tendons, and muscles of the ankle joint perform a lot of work each day. The ankle joint holds your body weight and allows you to move around. Ankle pain can occur on either side or back of 1 or both ankles. Ankle pain may be sharp and burning or dull and aching. There may be tenderness, stiffness, redness, or warmth around the ankle. The pain occurs more often when a person walks or puts pressure on the ankle. CAUSES  There are many reasons ankle pain can develop. It is important to work with your caregiver to identify the cause since many conditions can impact the bones, cartilage, muscles, and tendons. Causes for ankle pain include:  Injury, including a break (fracture), sprain, or strain often due to a fall, sports, or a high-impact activity.  Swelling (inflammation) of a  tendon (tendonitis).  Achilles tendon rupture.  Ankle instability after repeated sprains and strains.  Poor foot alignment.  Pressure on a nerve (tarsal tunnel syndrome).  Arthritis in the ankle or the lining of the ankle.  Crystal formation in the ankle (gout or pseudogout). DIAGNOSIS  A diagnosis is based on your medical history, your symptoms, results of your physical exam, and results of diagnostic tests. Diagnostic tests may include X-ray exams or a computerized magnetic scan (magnetic resonance imaging, MRI). TREATMENT  Treatment will depend on the cause of your ankle pain and may include:  Keeping pressure off the ankle and limiting activities.  Using crutches or other walking support (a cane or brace).  Using rest, ice, compression, and elevation.  Participating in physical therapy or home exercises.  Wearing shoe inserts or special shoes.  Losing weight.  Taking medications to reduce pain or swelling or receiving an injection.  Undergoing surgery. HOME CARE INSTRUCTIONS   Only take over-the-counter or prescription medicines for pain, discomfort, or fever as directed by your caregiver.  Put ice on the injured area.  Put ice in a plastic bag.  Place a towel between your skin and the bag.  Leave the ice on for 15-20 minutes at a time, 03-04 times a day.  Keep your leg raised (elevated) when possible to lessen swelling.  Avoid activities that cause ankle pain.  Follow specific exercises as directed by your caregiver.  Record how often you have ankle pain, the location of the pain, and what it feels like. This  information may be helpful to you and your caregiver.  Ask your caregiver about returning to work or sports and whether you should drive.  Follow up with your caregiver for further examination, therapy, or testing as directed. SEEK MEDICAL CARE IF:   Pain or swelling continues or worsens beyond 1 week.  You have an oral temperature above 102 F  (38.9 C).  You are feeling unwell or have chills.  You are having an increasingly difficult time with walking.  You have loss of sensation or other new symptoms.  You have questions or concerns. MAKE SURE YOU:   Understand these instructions.  Will watch your condition.  Will get help right away if you are not doing well or get worse.   This information is not intended to replace advice given to you by your health care provider. Make sure you discuss any questions you have with your health care provider.   Document Released: 01/11/2010 Document Revised: 10/16/2011 Document Reviewed: 02/23/2015 Elsevier Interactive Patient Education [employer]2016 Elsevier Inc.

## 2016-01-17 NOTE — Progress Notes (Signed)
Subjective:    Patient ID: Ronald Zhang, male    DOB: Dec 20, 1955, 60 y.o.   MRN: [id number] Chief Complaint  Patient presents with  . Ankle Pain    RIGHT   HPI  Patient is a 60 yo male who presents today with 5/10 pain superior, posterior and inferior to medial malleolus,   Gradually began hurting while working 4days ago, pain is now 5/10, he initially thought it was from his work boots rubbing his ankle, but he's been wearing them for 1 month and pain was not alleviated with changing shoes.  Has taken tramadol left over from knee replacement with some relief.  Admits to increased swelling, and difficulty ambulating due to pain.  Patient states "I am use to fluid, I have a leaky valve, I wear compression socks most days, I am supposed to take lasix, but I havent because it makes me go to the bathroom a lot."  Denies trauma or injury, history of similar injury, no recent PMH of strains, "I probably did as a boy", erythema, warmth.  History of double knee replacement,  right on nov 6 and left on Feb 3rd.  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  Review of Systems  Respiratory: Negative for chest tightness and shortness of breath.   Cardiovascular: Negative for chest pain and palpitations.   Patient Active Problem List   Diagnosis Date Noted  . Non-rheumatic mitral regurgitation   . Erosive gastritis 03/30/2015  . Colon polyps 03/30/2015  . Non-rheumatic tricuspid valve insufficiency 01/26/2015  . Pulmonary HTN (HCC) 10/08/2014  . Restless leg 10/08/2014  . Absolute anemia 10/08/2014  . Knee pain, chronic 10/08/2014  . H/O alcohol abuse 10/08/2014  . Erectile dysfunction 10/08/2014  . Insomnia 10/08/2014  . Pancreatic cyst 10/08/2014  . DJD (degenerative joint disease) of knee 10/15/2011  . HTN (hypertension) 10/15/2011   Current Outpatient Prescriptions on File Prior to Visit  Medication Sig Dispense Refill  .  furosemide (LASIX) 40 MG tablet Take 1 tablet (40 mg total) by mouth daily. Leg swelling 30 tablet 5  . gabapentin (NEURONTIN) 100 MG capsule Take 1 capsule (100 mg total) by mouth at bedtime. 90 capsule 0  . lisinopril (PRINIVIL,ZESTRIL) 20 MG tablet Take 1 tablet (20 mg total) by mouth 2 (two) times daily. 180 tablet 1  . rOPINIRole (REQUIP) 0.5 MG tablet Take 1/2 tab po qpm x 2d, then increase to 1 tab po qpm x5d, then increase by 1/2 tab po qpm every week as needed to achieve control. 90 tablet 1  . traZODone (DESYREL) 50 MG tablet Take 1 tablet (50 mg total) by mouth at bedtime. 90 tablet 0  . oxycodone (OXY-IR) 5 MG capsule Take 5 mg by mouth every 6 (six) hours as needed. Reported on 01/17/2016    . tadalafil (CIALIS) 20 MG tablet Take 0.5-1 tablets (10-20 mg total) by mouth daily as needed for erectile dysfunction (monitor for low blood presure and dizziness, need to take 1 hour before sex). (Patient not taking: Reported on 01/17/2016) 5 tablet 11   No current facility-administered medications on file prior to visit.     Allergies  Allergen Reactions  . Penicillins Swelling    Childhood   Dg Ankle Complete Right  01/17/2016  CLINICAL DATA:  60 year old male with right ankle pain and swelling. EXAM: RIGHT ANKLE - COMPLETE 3+ VIEW COMPARISON:  None. FINDINGS: There is no acute fracture or dislocation. The  ankle mortise is intact. The bones are osteopenic. There is diffuse soft tissue swelling of the ankle. No radiopaque foreign object identified. IMPRESSION: No acute fracture or dislocation. Electronically Signed   By: Elgie Collard M.D.   On: 01/17/2016 09:18      Objective:   Physical Exam  Constitutional: He is oriented to person, place, and time. He appears well-developed and well-nourished.  Neck: Normal range of motion. Neck supple. No thyromegaly present.  Cardiovascular: Normal rate, regular rhythm, normal heart sounds and intact distal pulses.   Pulses:      Dorsalis pedis  pulses are 2+ on the right side, and 2+ on the left side.       Posterior tibial pulses are 2+ on the right side, and 2+ on the left side.  Pulmonary/Chest: Effort normal and breath sounds normal.  Musculoskeletal: Normal range of motion. He exhibits edema and tenderness.       Right ankle: He exhibits swelling. He exhibits normal range of motion, no deformity and normal pulse. Tenderness. Medial malleolus tenderness found.  Neurological: He is alert and oriented to person, place, and time.  Skin: Skin is warm and dry.  Vascular changes on bilateral lower extremities      Assessment & Plan:  1. Right ankle pain - DG Ankle Complete Right; Future - meloxicam (MOBIC) 15 MG tablet; Take 1 tablet (15 mg total) by mouth daily.  Dispense: 20 tablet; Refill: 0  Imaging shows no acute abnormalities, but lower bone densities.  Patient prescribed meloxicam for pain.  Recommended patient restart lasix as prescribed, and wear compression stockings daily to reduce swelling.  Instructed patient to stay off his feet and elevate them as much as possible. Patient provided education about ankle injuries, appropriate home care and warning signs that prompt patient to seek out medical care.  Informed him to return to this office if his symptoms persist or worsen.  Yarimar Lavis P. Dolora Ridgely, PA-S

## 2016-01-20 ENCOUNTER — Encounter: Payer: Self-pay | Admitting: Physician Assistant

## 2016-01-22 ENCOUNTER — Ambulatory Visit (INDEPENDENT_AMBULATORY_CARE_PROVIDER_SITE_OTHER): Payer: BLUE CROSS/BLUE SHIELD | Admitting: Physician Assistant

## 2016-01-22 VITALS — BP 140/80 | HR 81 | Temp 98.5°F | Resp 16 | Ht 77.0 in | Wt 196.0 lb

## 2016-01-22 DIAGNOSIS — M25571 Pain in right ankle and joints of right foot: Secondary | ICD-10-CM

## 2016-01-22 DIAGNOSIS — R600 Localized edema: Secondary | ICD-10-CM

## 2016-01-22 DIAGNOSIS — Z114 Encounter for screening for human immunodeficiency virus [HIV]: Secondary | ICD-10-CM

## 2016-01-22 DIAGNOSIS — S80861A Insect bite (nonvenomous), right lower leg, initial encounter: Secondary | ICD-10-CM | POA: Diagnosis not present

## 2016-01-22 DIAGNOSIS — W57XXXA Bitten or stung by nonvenomous insect and other nonvenomous arthropods, initial encounter: Secondary | ICD-10-CM | POA: Diagnosis not present

## 2016-01-22 LAB — COMPREHENSIVE METABOLIC PANEL
ALBUMIN: 3.8 g/dL (ref 3.6–5.1)
ALK PHOS: 125 U/L — AB (ref 40–115)
ALT: 17 U/L (ref 9–46)
AST: 22 U/L (ref 10–35)
BUN: 21 mg/dL (ref 7–25)
CALCIUM: 8.9 mg/dL (ref 8.6–10.3)
CHLORIDE: 106 mmol/L (ref 98–110)
CO2: 26 mmol/L (ref 20–31)
Creat: 1.04 mg/dL (ref 0.70–1.25)
Glucose, Bld: 69 mg/dL (ref 65–99)
POTASSIUM: 3.9 mmol/L (ref 3.5–5.3)
Sodium: 143 mmol/L (ref 135–146)
Total Bilirubin: 1.5 mg/dL — ABNORMAL HIGH (ref 0.2–1.2)
Total Protein: 6.5 g/dL (ref 6.1–8.1)

## 2016-01-22 LAB — CBC WITH DIFFERENTIAL/PLATELET
BASOS ABS: 0 {cells}/uL (ref 0–200)
Basophils Relative: 0 %
EOS PCT: 3 %
Eosinophils Absolute: 165 cells/uL (ref 15–500)
HEMATOCRIT: 48.8 % (ref 38.5–50.0)
HEMOGLOBIN: 16.2 g/dL (ref 13.2–17.1)
LYMPHS ABS: 1100 {cells}/uL (ref 850–3900)
Lymphocytes Relative: 20 %
MCH: 29.1 pg (ref 27.0–33.0)
MCHC: 33.2 g/dL (ref 32.0–36.0)
MCV: 87.8 fL (ref 80.0–100.0)
MONO ABS: 495 {cells}/uL (ref 200–950)
Monocytes Relative: 9 %
NEUTROS PCT: 68 %
Neutro Abs: 3740 cells/uL (ref 1500–7800)
Platelets: 82 10*3/uL — ABNORMAL LOW (ref 140–400)
RBC: 5.56 MIL/uL (ref 4.20–5.80)
RDW: 14.4 % (ref 11.0–15.0)
WBC: 5.5 10*3/uL (ref 3.8–10.8)

## 2016-01-22 LAB — HIV ANTIBODY (ROUTINE TESTING W REFLEX): HIV: NONREACTIVE

## 2016-01-22 MED ORDER — HYDROCODONE-ACETAMINOPHEN 5-325 MG PO TABS: 1.0000 | ORAL_TABLET | Freq: Four times a day (QID) | ORAL | Status: DC | PRN

## 2016-01-22 MED ORDER — FUROSEMIDE 40 MG PO TABS: 40.0000 mg | ORAL_TABLET | Freq: Every day | ORAL | Status: DC

## 2016-01-22 MED ORDER — COLCHICINE 0.6 MG PO TABS: 0.6000 mg | ORAL_TABLET | Freq: Two times a day (BID) | ORAL | Status: DC

## 2016-01-22 NOTE — Progress Notes (Signed)
Patient ID: Ronald Zhang, male    DOB: 1955/12/28, 60 y.o.   MRN: [id number]  PCP: Rockne Coonshao Phuong Le, DO  Subjective:   Chief Complaint  Patient presents with  . Follow-up    Right ankle.     HPI Presents for evaluation of persistent RIGHT ankle pain.  I saw him for same on 01/17/2016, at which time he reported pain and swelling in the RIGHT ankle, no recalled injury, x 4 days and progressively worsening. He had tried Tramadol leftover from a RIGHT TKR 06/2015, without much benefit. It was noted that he had stopped taking furosemide due to increased urinary frequency.  Xrays at that visit were normal except for soft tissue swelling.  He was advised to resume the furosemide, and meloxicam was added to reduce inflammation and pain.  Today he reports continued, progressive pain and swelling. Couldn't find the furosemide, so has not resumed it. Feels ok when he first gets up from bed, but then the pain returns and becomes progressively worse once he's up and moving about. Rest and elevation doesn't help, except for the duration of his sleep (he works 3rd shift). 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  Tick bite 3 weeks ago. RIGHT leg, just below knee. Reports unexplained headaches since then. Most recent headache was yesterday. Resolved with Excedrin. No nausea/vomiting.   Review of Systems As above. No CP, SOB, cough. No increased fatigue.    Patient Active Problem List   Diagnosis Date Noted  . Non-rheumatic mitral regurgitation   . Erosive gastritis 03/30/2015  . Colon polyps 03/30/2015  . Non-rheumatic tricuspid valve insufficiency 01/26/2015  . Pulmonary HTN (HCC) 10/08/2014  . Restless leg 10/08/2014  . Absolute anemia 10/08/2014  . Knee pain, chronic 10/08/2014  . H/O alcohol abuse 10/08/2014  . Erectile dysfunction 10/08/2014  . Insomnia 10/08/2014  . Pancreatic cyst 10/08/2014  . DJD (degenerative joint disease) of knee 10/15/2011  . HTN (hypertension)  10/15/2011     Prior to Admission medications   Medication Sig Start Date End Date Taking? Authorizing Provider  gabapentin (NEURONTIN) 100 MG capsule Take 1 capsule (100 mg total) by mouth at bedtime. 12/10/15  Yes Sherren MochaEva N Shaw, MD  lisinopril (PRINIVIL,ZESTRIL) 20 MG tablet Take 1 tablet (20 mg total) by mouth 2 (two) times daily. 08/04/15  Yes Thao P Le, DO  meloxicam (MOBIC) 15 MG tablet Take 1 tablet (15 mg total) by mouth daily. 01/17/16  Yes Melvena Vink, PA-C  omeprazole (PRILOSEC) 20 MG capsule TK 1 C PO QAM 01/07/16  Yes Historical Provider, MD  rOPINIRole (REQUIP) 0.5 MG tablet Take 1/2 tab po qpm x 2d, then increase to 1 tab po qpm x5d, then increase by 1/2 tab po qpm every week as needed to achieve control. 12/10/15  Yes Sherren MochaEva N Shaw, MD  traMADol (ULTRAM) 50 MG tablet TK 1 T PO Q 12 H PRN 11/04/15  Yes Historical Provider, MD  traZODone (DESYREL) 50 MG tablet Take 1 tablet (50 mg total) by mouth at bedtime. 12/10/15  Yes Sherren MochaEva N Shaw, MD  VITAMIN D, CHOLECALCIFEROL, PO Take by mouth.   Yes Historical Provider, MD  furosemide (LASIX) 40 MG tablet Take 1 tablet (40 mg total) by mouth daily. Leg swelling Patient not taking: Reported on 01/22/2016 10/06/14   Thao P Le, DO     Allergies  Allergen Reactions  . Penicillins Swelling    Childhood       Objective:  Physical Exam  Constitutional:  He is oriented to person, place, and time. He appears well-developed and well-nourished. He is active and cooperative. No distress.  BP 140/80 mmHg  Pulse 81  Temp(Src) 98.5 F (36.9 C) (Oral)  Resp 16  Ht  (1.956 m)  Wt 196 lb (88.905 kg)  BMI 23.24 kg/m2  SpO2 97%   Eyes: Conjunctivae are normal.  Cardiovascular:  Pulses:      Dorsalis pedis pulses are 2+ on the right side, and 2+ on the left side.  Not able to adequately palpate the PT pulses due to swelling. Capillary refill <2 seconds  Pulmonary/Chest: Effort normal.  Musculoskeletal:       Right ankle: He exhibits swelling.  Tenderness (diffusely). Achilles tendon normal.       Left ankle: He exhibits swelling.       Right lower leg: He exhibits edema.       Left lower leg: He exhibits edema.       Right foot: There is swelling.       Left foot: There is swelling.  Neurological: He is alert and oriented to person, place, and time.  Skin: Skin is warm, dry and intact.  Venous stasis changes of both LE and feet. Now with small vascular-appearing macules around the malleoli, consistent with small hemorrhages in the skin from swelling.  Psychiatric: He has a normal mood and affect. His speech is normal and behavior is normal.           Assessment & Plan:   1. Right ankle pain Suspect that this is due to the LE edema, worse on this side likely due to the more recent TKR. Doubt gout, but as I cannot exclude it, we'll try colchicine. Short course of opiate so that he can rest this weekend. - colchicine 0.6 MG tablet; Take 1 tablet (0.6 mg total) by mouth 2 (two) times daily.  Dispense: 20 tablet; Refill: 0 - HYDROcodone-acetaminophen (NORCO) 5-325 MG tablet; Take 1-2 tablets by mouth every 6 (six) hours as needed for severe pain.  Dispense: 30 tablet; Refill: 0  2. Bilateral edema of lower extremity Resume furosemide, continue compression stockings, Advised to hydrate well. Await lab results. - furosemide (LASIX) 40 MG tablet; Take 1 tablet (40 mg total) by mouth daily. Leg swelling  Dispense: 30 tablet; Refill: 5 - CBC with Differential/Platelet - Comprehensive metabolic panel  3. Tick bite Given duration since tick bite, doubt the headache represents tick-borne illness. - [place] spotted fvr abs pnl(IgG+IgM) - B. [place] Antibodies  4. Screening for HIV (human immunodeficiency virus) - HIV antibody   Fernande Bras, PA-C Physician Assistant-Certified [facility]

## 2016-01-22 NOTE — Patient Instructions (Addendum)
Restart the Furosemide for the swelling. Wear the compression stockings and keep your legs elevated as much as you can this weekend. For the first dose of colchicine, you may take 2 tablets, but each dose thereafter should only be 1 tablet. Continue the meloxicam. Use the hydrocodone for severe pain not controlled by the other medications.    IF you received an x-ray today, you will receive an invoice from [facility]. Please contact [facility] at [phone] with questions or concerns regarding your invoice.   IF you received labwork today, you will receive an invoice from [facility]. Please contact [facility] at [phone] with questions or concerns regarding your invoice.   Our billing staff will not be able to assist you with questions regarding bills from these companies.  You will be contacted with the lab results as soon as they are available. The fastest way to get your results is to activate your My Chart account. Instructions are located on the last page of this paperwork. If you have not heard from us regarding the results in 2 weeks, please contact this office.

## 2016-01-24 LAB — LYME AB/WESTERN BLOT REFLEX: B burgdorferi Ab IgG+IgM: <0.9 Index (ref <0.90)

## 2016-01-25 LAB — ROCKY MTN SPOTTED FVR ABS PNL(IGG+IGM)
RMSF IGM: NOT DETECTED
RMSF IgG: NOT DETECTED

## 2016-01-26 ENCOUNTER — Telehealth: Payer: Self-pay

## 2016-01-26 NOTE — Telephone Encounter (Signed)
Greatly improved since Saturday.

## 2016-01-28 ENCOUNTER — Encounter: Payer: Self-pay | Admitting: Physician Assistant

## 2016-02-03 ENCOUNTER — Other Ambulatory Visit: Payer: Self-pay | Admitting: Physician Assistant

## 2016-02-05 NOTE — Telephone Encounter (Signed)
Meds ordered this encounter  Medications  . meloxicam (MOBIC) 15 MG tablet    Sig: TAKE 1 TABLET(15 MG) BY MOUTH DAILY    Dispense:  20 tablet    Refill:  0

## 2016-02-05 NOTE — Telephone Encounter (Signed)
Chelle, do you want to give RF?

## 2016-02-14 ENCOUNTER — Other Ambulatory Visit: Payer: Self-pay | Admitting: Family Medicine

## 2016-03-05 ENCOUNTER — Encounter: Payer: Self-pay | Admitting: Physician Assistant

## 2016-03-05 DIAGNOSIS — M1712 Unilateral primary osteoarthritis, left knee: Secondary | ICD-10-CM

## 2016-03-06 ENCOUNTER — Other Ambulatory Visit: Payer: Self-pay | Admitting: Family Medicine

## 2016-03-07 ENCOUNTER — Other Ambulatory Visit: Payer: Self-pay | Admitting: Family Medicine

## 2016-03-09 ENCOUNTER — Other Ambulatory Visit: Payer: Self-pay | Admitting: Family Medicine

## 2016-03-13 ENCOUNTER — Other Ambulatory Visit: Payer: Self-pay | Admitting: Family Medicine

## 2016-03-13 NOTE — Telephone Encounter (Signed)
PATIENT IS CALLING TO FOLLOW-UP WITH HIS REFILL REQUEST FOR GABAPENTIN 100 MG. HE SAID WE SHOULD HAVE HEARD FROM HIS PHARMACY. HE WOULD LIKE DR. SHAW TO KNOW THAT THE PHARMACIST HAS GIVEN HIM A FEW EMERGENCY PILLS BECAUSE HE IS COMPLETELY OUT, BUT THEY WILL NOT GIVE HIM ANY MORE WITHOUT AUTHORIZATION. BEST PHONE [phone] (CELL)  PLEASE CALL HIM WHEN THE PRESCRIPTION CAN BE PICKED UP.  MBC

## 2016-03-14 ENCOUNTER — Other Ambulatory Visit: Payer: Self-pay | Admitting: Family Medicine

## 2016-03-20 ENCOUNTER — Ambulatory Visit (INDEPENDENT_AMBULATORY_CARE_PROVIDER_SITE_OTHER): Payer: BLUE CROSS/BLUE SHIELD | Admitting: Family Medicine

## 2016-03-20 ENCOUNTER — Encounter: Payer: Self-pay | Admitting: Family Medicine

## 2016-03-20 VITALS — BP 160/84 | HR 76 | Temp 98.1°F | Resp 18 | Ht 77.0 in | Wt 202.0 lb

## 2016-03-20 DIAGNOSIS — R600 Localized edema: Secondary | ICD-10-CM

## 2016-03-20 DIAGNOSIS — J4521 Mild intermittent asthma with (acute) exacerbation: Secondary | ICD-10-CM | POA: Diagnosis not present

## 2016-03-20 DIAGNOSIS — I2729 Other secondary pulmonary hypertension: Secondary | ICD-10-CM

## 2016-03-20 DIAGNOSIS — N529 Male erectile dysfunction, unspecified: Secondary | ICD-10-CM

## 2016-03-20 DIAGNOSIS — I10 Essential (primary) hypertension: Secondary | ICD-10-CM | POA: Diagnosis not present

## 2016-03-20 DIAGNOSIS — D696 Thrombocytopenia, unspecified: Secondary | ICD-10-CM

## 2016-03-20 DIAGNOSIS — F1011 Alcohol abuse, in remission: Secondary | ICD-10-CM

## 2016-03-20 DIAGNOSIS — K862 Cyst of pancreas: Secondary | ICD-10-CM

## 2016-03-20 DIAGNOSIS — G4700 Insomnia, unspecified: Secondary | ICD-10-CM

## 2016-03-20 DIAGNOSIS — I272 Other secondary pulmonary hypertension: Secondary | ICD-10-CM | POA: Diagnosis not present

## 2016-03-20 DIAGNOSIS — K7460 Unspecified cirrhosis of liver: Secondary | ICD-10-CM

## 2016-03-20 DIAGNOSIS — F1010 Alcohol abuse, uncomplicated: Secondary | ICD-10-CM

## 2016-03-20 DIAGNOSIS — G2581 Restless legs syndrome: Secondary | ICD-10-CM

## 2016-03-20 LAB — CBC WITH DIFFERENTIAL/PLATELET
BASOS ABS: 0 {cells}/uL (ref 0–200)
Basophils Relative: 0 %
EOS ABS: 198 {cells}/uL (ref 15–500)
EOS PCT: 6 %
HCT: 47.4 % (ref 38.5–50.0)
HEMOGLOBIN: 16 g/dL (ref 13.2–17.1)
Lymphs Abs: 660 cells/uL — ABNORMAL LOW (ref 850–3900)
MCH: 29 pg (ref 27.0–33.0)
MCHC: 33.8 g/dL (ref 32.0–36.0)
MCV: 86 fL (ref 80.0–100.0)
MONOS PCT: 8 %
Monocytes Absolute: 264 cells/uL (ref 200–950)
NEUTROS ABS: 2178 {cells}/uL (ref 1500–7800)
NEUTROS PCT: 66 %
Platelets: 68 10*3/uL — ABNORMAL LOW (ref 140–400)
RBC: 5.51 MIL/uL (ref 4.20–5.80)
RDW: 14.5 % (ref 11.0–15.0)
WBC: 3.3 10*3/uL — ABNORMAL LOW (ref 3.8–10.8)

## 2016-03-20 LAB — FOLATE: FOLATE: 12.5 ng/mL (ref >5.4)

## 2016-03-20 LAB — VITAMIN B12: Vitamin B-12: 989 pg/mL (ref 200–1100)

## 2016-03-20 LAB — HEPATITIS C ANTIBODY: HCV AB: NEGATIVE

## 2016-03-20 LAB — PROTIME-INR
INR: 1.2 — ABNORMAL HIGH
PROTHROMBIN TIME: 12.3 s — AB (ref 9.0–11.5)

## 2016-03-20 MED ORDER — GABAPENTIN 100 MG PO CAPS: 100.0000 mg | ORAL_CAPSULE | Freq: Two times a day (BID) | ORAL | 1 refills | Status: DC

## 2016-03-20 MED ORDER — TRAZODONE HCL 50 MG PO TABS: ORAL_TABLET | ORAL | 3 refills | Status: DC

## 2016-03-20 MED ORDER — LISINOPRIL 20 MG PO TABS: ORAL_TABLET | ORAL | 3 refills | Status: DC

## 2016-03-20 MED ORDER — ROPINIROLE HCL 0.5 MG PO TABS: ORAL_TABLET | ORAL | 1 refills | Status: DC

## 2016-03-20 MED ORDER — VARDENAFIL HCL 20 MG PO TABS: 20.0000 mg | ORAL_TABLET | Freq: Every day | ORAL | 2 refills | Status: DC | PRN

## 2016-03-20 MED ORDER — ALBUTEROL SULFATE 108 (90 BASE) MCG/ACT IN AEPB: 2.0000 | INHALATION_SPRAY | RESPIRATORY_TRACT | 2 refills | Status: DC | PRN

## 2016-03-20 NOTE — Patient Instructions (Addendum)
IF you received an x-ray today, you will receive an invoice from [facility]. Please contact [facility] at [phone] with questions or concerns regarding your invoice.   IF you received labwork today, you will receive an invoice from [facility]. Please contact [facility] at [phone] with questions or concerns regarding your invoice.   Our billing staff will not be able to assist you with questions regarding bills from these companies.  You will be contacted with the lab results as soon as they are available. The fastest way to get your results is to activate your My Chart account. Instructions are located on the last page of this paperwork. If you have not heard from us regarding the results in 2 weeks, please contact this office.     Thrombocytopenia Thrombocytopenia is a condition in which there is an abnormally small number of platelets in your blood. Platelets are also called thrombocytes. Platelets are needed for blood clotting. CAUSES Thrombocytopenia is caused by:   Decreased production of platelets. This can be caused by:  Aplastic anemia in which your bone marrow quits making blood cells.  Cancer in the bone marrow.  Use of certain medicines, including chemotherapy.  Infection in the bone marrow.  Heavy alcohol consumption.  Increased destruction of platelets. This can be caused by:  Certain immune diseases.  Use of certain drugs.  Certain blood clotting disorders.  Certain inherited disorders.  Certain bleeding disorders.  Pregnancy.  Having an enlarged spleen (hypersplenism). In hypersplenism, the spleen gathers up platelets from circulation. This means the platelets are not available to help with blood clotting. The spleen can enlarge due to cirrhosis or other conditions. SYMPTOMS  The symptoms of thrombocytopenia are side effects of poor blood clotting. Some of these are:  Abnormal  bleeding.  Nosebleeds.  Heavy menstrual periods.  Blood in the urine or stools.  Purpura. This is a purplish discoloration in the skin produced by small bleeding vessels near the surface of the skin.  Bruising.  A rash that may be petechial. This looks like pinpoint, purplish-red spots on the skin and mucous membranes. It is caused by bleeding from small blood vessels (capillaries). DIAGNOSIS  Your caregiver will make this diagnosis based on your exam and blood tests. Sometimes, a bone marrow study is done to look for the original cells (megakaryocytes) that make platelets. TREATMENT  Treatment depends on the cause of the condition.  Medicines may be given to help protect your platelets from being destroyed.  In some cases, a replacement (transfusion) of platelets may be required to stop or prevent bleeding.  Sometimes, the spleen must be surgically removed. HOME CARE INSTRUCTIONS   Check the skin and linings inside your mouth for bruising or bleeding as directed by your caregiver.  Check your sputum, urine, and stool for blood as directed by your caregiver.  Do not return to any activities that could cause bumps or bruises until your caregiver says it is okay.  Take extra care not to cut yourself when shaving or when using scissors, needles, knives, and other tools.  Take extra care not to burn yourself when ironing or cooking.  Ask your caregiver if it is okay for you to drink alcohol.  Only take over-the-counter or prescription medicines as directed by your caregiver.  Notify all your caregivers, including dentists and eye doctors, about your condition. SEEK IMMEDIATE MEDICAL CARE IF:   You develop active bleeding from anywhere in your body.  You develop unexplained  bruising or bleeding.  You have blood in your sputum, urine, or stool. MAKE SURE YOU:  Understand these instructions.  Will watch your condition.  Will get help right away if you are not doing well  or get worse.   This information is not intended to replace advice given to you by your health care provider. Make sure you discuss any questions you have with your health care provider.   Document Released: 07/24/2005 Document Revised: 10/16/2011 Document Reviewed: 01/25/2015 Elsevier Interactive Patient Education [employer]2016 [employer]

## 2016-03-21 ENCOUNTER — Telehealth: Payer: Self-pay | Admitting: Emergency Medicine

## 2016-03-21 DIAGNOSIS — K7460 Unspecified cirrhosis of liver: Secondary | ICD-10-CM | POA: Insufficient documentation

## 2016-03-21 LAB — APTT: APTT: 30 s (ref 22–34)

## 2016-03-21 LAB — PATHOLOGIST SMEAR REVIEW

## 2016-03-21 NOTE — Addendum Note (Signed)
Addended by: Norberto SorensonSHAW, Zorana Brockwell on: 03/21/2016 03:48 PM   Modules accepted: Orders

## 2016-03-21 NOTE — Progress Notes (Signed)
Subjective:  By signing my name below, I, [name], attest that this documentation has been prepared under the direction and in the presence of Norberto Sorenson, MD. Electronically Signed: [name], Scribe. 03/21/2016 , 9:09 AM .  Patient was seen in Room 11 .   Patient ID: Ronald Zhang, male    DOB: 09/06/1955, 60 y.o.   MRN: [id number] Chief Complaint  Patient presents with  . Medication Refill    Lisinopril    Medication Refill  Associated symptoms include coughing and fatigue. Pertinent negatives include no chills, fever, nausea, rash or vomiting.   Ronald Zhang is a 60 y.o. male who presents to [facility] requesting medication refill of lisinopril. He states he took his last dose of lisinopril this morning. He sometimes checks his BP outside of the office, which runs about 140/80 on average. He informs having interrupted sleep and usually has to take an extra nap during the day. Patient still takes his trazodone after work in the morning, and restarted the Requip recently. He also had an emergency supply of gabapentin recently.   He mentions his restless leg syndrome starting up after he eats. His ankles are doing well. He's been cautious of his bilateral calves swelling, which has been ongoing for 3~4 years. He wears compression stockings. His knees are also doing well, recently seen his surgeon a few weeks ago.   Patient denies having history of pulmonary HTN. He was seen by cardiology in [place] several times, believes last visit was in Oct 2016, prior to his knee replacement surgery. He was informed to have a leaky valve, which is causing his pedal edema but no abnormality. He also has erectile dysfunction; has used viagra previously with some success. He used cialis in the past but it caused a headache, and would like to try a different medication. Notes having long standing history of low platelets due to his liver dysfunction from his alcohol abuse. He also informs  having chest tightness at night with some cough. He has a history of asthma as a child, and responded well to albuterol last year, requesting refill. He denies history of smoking.   He's still attending to AA.   Past Medical History:  Diagnosis Date  . Alcohol dependency (HCC)    stopped drinking since November 09, 2012, restarted drinking on 01/24/13  . Allergy   . Anemia    iron deficiency, non compliant with iron  . Arthritis    bilateral knees,secondary to trauma  . Cellulitis and abscess of leg    Hospitalized at [facility] from 4/5-4/10/14 for bilateral LE edema and cellulitis. Was on IV Lasix and Vancomycin.   . Erectile dysfunction   . H/O non anemic vitamin B12 deficiency 2014  . Hypertension   . Insomnia    3rd shift   . Non-rheumatic mitral regurgitation    Asymptomatic, Seen by cardiology 01/26/15, Dr Norman Herrlich, [facility]   . Pancreatic cyst    Multiple, MRI of abd and pelvis in 12/2013  , ? sequelae of chronic pancreatitis , he has a hx of alcoholism, suggested repeat MRI in 6 months fro progression, appears nonmalignant  . Pulmonary HTN (HCC)    TEE on March 31,2014-mild-mod concentric hypertrophy.  EF 60-65%. Left atrial valve mildly dilated, right atrium mildly dilated, mild-moderate MR, mild TR. Pum Pressure 51 mm Hg  . Restless leg   . Urinary incontinence    Prior to Admission medications   Medication Sig Start Date  End Date Taking? Authorizing Provider  furosemide (LASIX) 40 MG tablet Take 1 tablet (40 mg total) by mouth daily. Leg swelling 01/22/16  Yes Chelle Jeffery, PA-C  gabapentin (NEURONTIN) 100 MG capsule TAKE 1 CAPSULE BY MOUTH AT BEDTIME AS NEEDED FOR RESTLESS LEGS AT NIGHT 03/12/16  Yes Sherren MochaEva N Shaw, MD  gabapentin (NEURONTIN) 100 MG capsule TAKE 1 CAPSULE BY MOUTH AT BEDTIME AS NEEDED FOR RESTLESS LEGS AT NIGHT 03/14/16  Yes Sherren MochaEva N Shaw, MD  lisinopril (PRINIVIL,ZESTRIL) 20 MG tablet TAKE 1 TABLET(20 MG) BY MOUTH TWICE DAILY 02/16/16  Yes  Sherren MochaEva N Shaw, MD  omeprazole (PRILOSEC) 20 MG capsule TK 1 C PO QAM 01/07/16  Yes Historical Provider, MD  rOPINIRole (REQUIP) 0.5 MG tablet TAKE 1/2 TABLET BY MOUTH EVERY PM X 2DAYS, THEN 1 EVERY PMX 5 DAYS THEN INCREASE BY 1/2 TABLET IN THE PM AS NEEDED TO ACHIEVE CONTROL 03/10/16  Yes Sherren MochaEva N Shaw, MD  traZODone (DESYREL) 50 MG tablet TAKE 1 TABLET(50 MG) BY MOUTH AT BEDTIME 03/07/16  Yes Sherren MochaEva N Shaw, MD  VITAMIN D, CHOLECALCIFEROL, PO Take by mouth.   Yes Historical Provider, MD  colchicine 0.6 MG tablet Take 1 tablet (0.6 mg total) by mouth 2 (two) times daily. Patient not taking: Reported on 03/20/2016 01/22/16   Porfirio Oarhelle Jeffery, PA-C  HYDROcodone-acetaminophen (NORCO) 5-325 MG tablet Take 1-2 tablets by mouth every 6 (six) hours as needed for severe pain. Patient not taking: Reported on 03/20/2016 01/22/16   Porfirio Oarhelle Jeffery, PA-C  meloxicam (MOBIC) 15 MG tablet TAKE 1 TABLET(15 MG) BY MOUTH DAILY Patient not taking: Reported on 03/20/2016 02/05/16   Chelle Jeffery, PA-C  traMADol (ULTRAM) 50 MG tablet TK 1 T PO Q 12 H PRN 11/04/15   Historical Provider, MD   Allergies  Allergen Reactions  . Penicillins Swelling    Childhood   Review of Systems  Constitutional: Positive for fatigue. Negative for chills and fever.  Respiratory: Positive for cough and chest tightness. Negative for shortness of breath and wheezing.   Cardiovascular: Positive for leg swelling.  Gastrointestinal: Negative for diarrhea, nausea and vomiting.  Skin: Negative for rash and wound.  Hematological: Does not bruise/bleed easily.       Objective:   Physical Exam  Constitutional: He is oriented to person, place, and time. He appears well-developed and well-nourished. No distress.  HENT:  Head: Normocephalic and atraumatic.  Eyes: EOM are normal. Pupils are equal, round, and reactive to light.  Neck: Neck supple.  Cardiovascular: Normal rate.   Pulmonary/Chest: Effort normal. No respiratory distress.  Musculoskeletal: Normal  range of motion.  1+ pitting edema on left, trace on right  Neurological: He is alert and oriented to person, place, and time.  Skin: Skin is warm and dry.  Few petechiae over forearms and legs  Psychiatric: He has a normal mood and affect. His behavior is normal.  Nursing note and vitals reviewed.   BP (!) 160/84 (BP Location: Right Arm, Patient Position: Sitting, Cuff Size: Small)   Pulse 76   Temp 98.1 F (36.7 C) (Oral)   Resp 18   Ht 6\' 5"  (1.956 m)   Wt 202 lb (91.6 kg)   SpO2 95%   BMI 23.95 kg/m       Assessment & Plan:  Weight stable 1. Thrombocytopenia (HCC) - pt reports this is chronic - recheck with smear and coags. Neg HIV/all other labs nml sev mos prior. Pt does have a h/o splenomegaly, likely secondary to developing cirrhosis  seen on MR Abd 2 yrs prior.  Plts today have cont to decrease over the past year 85 -> 99-> 82 -> 68.  Prior to this, lowest documented plt count was 77 on August either 2015 or 2016 per GI note (Roy, scanned into media tab). Pt is not having any bleeding and no signs/sxs of worsening cirrhosis.  Not anemic and mild leukopenia remains stable.  Will repeat abd [place] to eval liver and spleen to ensure not sig change/worseinng of disease.  Recheck plts in 1 mo and refer to hematology and back to GI if plts are continuing to trend down.  2. Essential hypertension - well controlled on current regimen  3. Pancreatic cyst   4. Insomnia   5. Pedal edema   6. Erectile dysfunction, unspecified erectile dysfunction type - cialis caused HA, used viagra with some success prior but would like to try other - rx levitra.  7. Asthma with exacerbation, mild intermittent - as a child, maybe recurring as some nighttime tightness relieved with albuterol during allergy season, no h/o tob abuse, neg alpha-1 antitrypsin.  8.      H/o EtOH addiction - pt is sober since 05/2013, when through intensive o/p treatment at that time and is still very active in [place] and doing  great - proud of his sobriety and looking forward to his 3 yr anniversary! 9.       Cirrhosis - MR Abd 2 yrs prev showed some mild nodularity of the liver with recanalization of the paraumbilical vein and splenomegaly which was suggestive of cirrhosis and portal venous HTN but since then the transaminitis has resolved and liver function has been stable. Pt has been seen by GI at [facility] - Dr. Braulio Conte within the last yr - 05/12/2015. He has a h/o hep B exposure and labs now c/w immunity. HIV and Hep C neg. 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 Imaging around 2015 did not some changes c/w poss chronic pancreatitis but pt has not had any prob with abd pain or n/v so no further eval has been needed. Pt had a colonoscopy and upper endoscopy done 03/30/15. Colonoscopy found 5 significant adenomatous polyps ascending and transverse colon diminutive adenomas - not all polyps were able to be removed. Endoscopy showed erosive gastritis but no varices. 10.  Pulmonary HTN: Pt denies any prior knowledge or treatment of this. However, his PMHx notes that TEE on March 31,2014-mild-mod concentric hypertrophy.  EF 60-65%. Both atrium mildly dilated, mild-moderate MR, mild TR. Pum Pressure 51 mm Hg -  Very mild elevation. Prior to pt's knee surg last year he was seen by cardiology Dr. Norman Herrlich at [facility] w/ last visit on 01/26/2015. Dr. Dulce Sellar noted that the elevated pulmonary artery pressures were minimal and not of clinical importance - this may be due to his anemia or chronic liver disease but cardiology did not see a role for drug therapy or further eval at that time. 11.   Restless legs:  Failed requip alone so started on gabapentin which does help some so at last visit added back in requip to try them  together.  Cont requip, increase gabapentin from 100 bid to 200 bid if tolerated by sedation.   Orders Placed This Encounter  Procedures  . CBC with Differential/Platelet  . Pathologist  smear review  . Folate  . Vitamin B12  . Hepatitis C Antibody  . Ceruloplasmin  . APTT  . Protime-INR  . Care order/instruction:    AVS and GO    Scheduling Instructions:     AVS and GO after labs are drawn.    Meds ordered this encounter  Medications  . traZODone (DESYREL) 50 MG tablet    Sig: TAKE 1 TABLET(50 MG) BY MOUTH AT BEDTIME    Dispense:  90 tablet    Refill:  3    Office visit needed for refills  . rOPINIRole (REQUIP) 0.5 MG tablet    Sig: 1 tab po bid for restless legs    Dispense:  180 tablet    Refill:  1  . lisinopril (PRINIVIL,ZESTRIL) 20 MG tablet    Sig: TAKE 1 TABLET(20 MG) BY MOUTH TWICE DAILY    Dispense:  180 tablet    Refill:  3  . gabapentin (NEURONTIN) 100 MG capsule    Sig: Take 1-2 capsules (100-200 mg total) by mouth 2 (two) times daily.    Dispense:  360 capsule    Refill:  1  . Albuterol Sulfate (PROAIR RESPICLICK) 108 (90 Base) MCG/ACT AEPB    Sig: Inhale 2 puffs into the lungs every 4 (four) hours as needed.    Dispense:  1 each    Refill:  2  . vardenafil (LEVITRA) 20 MG tablet    Sig: Take 1 tablet (20 mg total) by mouth daily as needed for erectile dysfunction.    Dispense:  10 tablet    Refill:  2   Over 40 min spent in face-to-face evaluation of and consultation with patient and coordination of care.  Over 50% of this time was spent counseling this patient.  I personally performed the services described in this documentation, which was scribed in my presence. The recorded information has been reviewed and considered, and addended by me as needed.   Norberto SorensonEva Shaw, M.D.  [facility]  [place] [address] [place], [place] [address] [phone] phone [phone] fax  03/21/16 12:56 PM  Results for orders placed or  performed in visit on 03/20/16  CBC with Differential/Platelet  Result Value Ref Range   WBC 3.3 (L) 3.8 - 10.8 K/uL   RBC 5.51 4.20 - 5.80 MIL/uL   Hemoglobin 16.0 13.2 - 17.1 g/dL   HCT 69.647.4 29.538.5 - 28.450.0 %   MCV 86.0 80.0 - 100.0 fL   MCH 29.0 27.0 - 33.0 pg   MCHC 33.8 32.0 - 36.0 g/dL   RDW 13.214.5 44.011.0 - 10.215.0 %   Platelets 68 (L) 140 - 400 K/uL   Neutro Abs 2,178 1,500 - 7,800 cells/uL   Lymphs Abs 660 (L) 850 - 3,900 cells/uL   Monocytes Absolute 264 200 - 950 cells/uL   Eosinophils Absolute 198 15 - 500 cells/uL   Basophils Absolute 0 0 - 200 cells/uL   Neutrophils Relative % 66 %   Monocytes Relative 8 %   Eosinophils Relative 6 %   Basophils Relative 0 %   Smear Review SEE NOTE   Folate  Result Value Ref Range   Folate 12.5 >5.4 ng/mL  Vitamin B12  Result Value Ref Range   Vitamin B-12 989 200 - 1,100 pg/mL  Hepatitis C Antibody  Result Value Ref Range   HCV Ab NEGATIVE NEGATIVE  APTT  Result Value Ref Range   aPTT 30 22 - 34 sec  Protime-INR  Result Value Ref Range   Prothrombin Time 12.3 (H) 9.0 - 11.5 sec   INR 1.2 (H)

## 2016-03-21 NOTE — Telephone Encounter (Signed)
-----   Message from Sherren MochaEva N Shaw, MD sent at 03/21/2016  1:01 PM EDT ----- Please let pt know that his platelet are still decreasing - they are the lowest they have ever been - however, I do think it would be worth rechecking them one more time before we do further evaluation of his spleen or refer him back to a specialist as there have been other times when his platelets have dropped temporarily.  Therefore, rec he come back to have them rechecked in 1-2 mos - ok to do a lab only visit for this if he would prefer.  His other blood cells and blood clotting ability appear normal for him and his vitamin B levels are good.

## 2016-03-22 ENCOUNTER — Telehealth: Payer: Self-pay

## 2016-03-22 LAB — CERULOPLASMIN: CERULOPLASMIN: 29 mg/dL (ref 18–36)

## 2016-03-22 NOTE — Telephone Encounter (Signed)
PA completed on covermymeds for Levitra. Pt has tried Viagra w/some success, and then could not tolerate Cialis d/t SE of HAs. Pending ins decision.

## 2016-03-23 NOTE — Telephone Encounter (Signed)
PA approved through 03/23/17, but there is quantity limit of #6 for every 25 days. I faxed this info back to pharm.

## 2016-04-09 ENCOUNTER — Other Ambulatory Visit: Payer: Self-pay | Admitting: Family Medicine

## 2016-05-03 ENCOUNTER — Other Ambulatory Visit: Payer: Self-pay | Admitting: Family Medicine

## 2016-06-21 ENCOUNTER — Telehealth: Payer: Self-pay

## 2016-06-21 DIAGNOSIS — N529 Male erectile dysfunction, unspecified: Secondary | ICD-10-CM

## 2016-06-21 NOTE — Telephone Encounter (Signed)
Pt is needing a urology referral because the two meds for ED has not been helping -he does not want to see a urologist in [place]   Best number [phone]

## 2016-06-22 NOTE — Telephone Encounter (Signed)
Referral placed.

## 2016-06-28 NOTE — Telephone Encounter (Signed)
Checked referral notes and appt has already been sch w/pt.

## 2016-07-24 ENCOUNTER — Other Ambulatory Visit: Payer: Self-pay | Admitting: Family Medicine

## 2016-07-26 NOTE — Telephone Encounter (Signed)
Last ov and lab 03/2016 Update directions

## 2016-07-29 NOTE — Telephone Encounter (Signed)
It appears Dr. Clelia CroftShaw went ahead and refilled this. Please let me know if there is anything else that needs to happen. Thank you.

## 2016-08-04 DIAGNOSIS — D509 Iron deficiency anemia, unspecified: Secondary | ICD-10-CM | POA: Diagnosis not present

## 2016-08-04 DIAGNOSIS — D696 Thrombocytopenia, unspecified: Secondary | ICD-10-CM

## 2016-08-04 DIAGNOSIS — K7030 Alcoholic cirrhosis of liver without ascites: Secondary | ICD-10-CM | POA: Diagnosis not present

## 2016-08-04 DIAGNOSIS — I10 Essential (primary) hypertension: Secondary | ICD-10-CM | POA: Diagnosis not present

## 2016-08-04 DIAGNOSIS — J189 Pneumonia, unspecified organism: Secondary | ICD-10-CM | POA: Diagnosis not present

## 2016-08-05 DIAGNOSIS — I10 Essential (primary) hypertension: Secondary | ICD-10-CM | POA: Diagnosis not present

## 2016-08-05 DIAGNOSIS — D509 Iron deficiency anemia, unspecified: Secondary | ICD-10-CM | POA: Diagnosis not present

## 2016-08-05 DIAGNOSIS — K7030 Alcoholic cirrhosis of liver without ascites: Secondary | ICD-10-CM | POA: Diagnosis not present

## 2016-08-05 DIAGNOSIS — J189 Pneumonia, unspecified organism: Secondary | ICD-10-CM | POA: Diagnosis not present

## 2016-10-03 IMAGING — CR DG FINGER MIDDLE 2+V*R*
1 series · 1 of 1 positions shown · non-contrast
Comparison: None.

CLINICAL DATA: Acute right middle finger pain.

EXAM:
RIGHT MIDDLE FINGER 2+V

[PA]
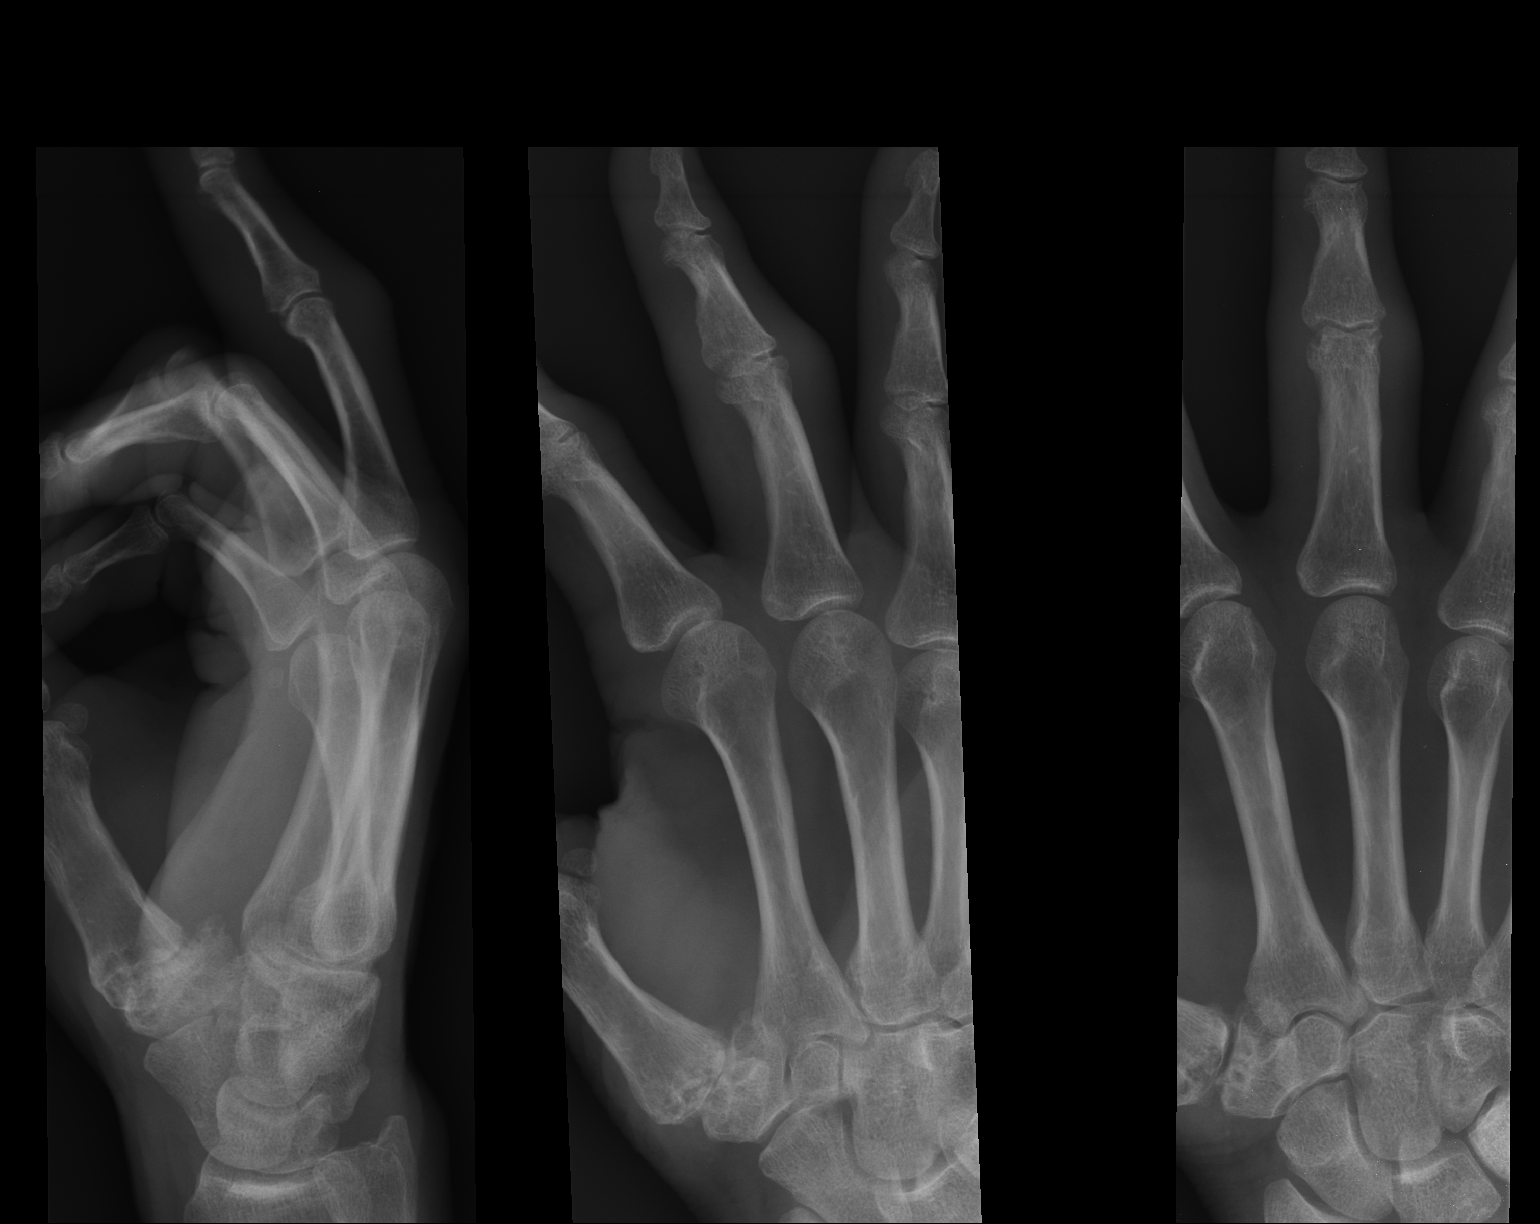

[1 of 1 positions shown; findings below may reference images not displayed]

FINDINGS: Soft tissue swelling is noted around the proximal interphalangeal
joint of the middle finger. There is seen a lucency involving the
distal portion of the first proximal phalanx ; it is uncertain if
this represents a nutrient channel or potentially nondisplaced
fracture. Joint spaces appear to be intact. No radiopaque foreign
body is noted.
IMPRESSION: Lucency seen of the distal portion of the first proximal phalanx
which may simply represent nutrient channel or possible nondisplaced
fracture. Follow-up radiographs are recommended if there is
persistent pain at the site.

## 2016-11-14 DIAGNOSIS — D61818 Other pancytopenia: Secondary | ICD-10-CM | POA: Diagnosis not present

## 2016-11-14 DIAGNOSIS — R161 Splenomegaly, not elsewhere classified: Secondary | ICD-10-CM | POA: Diagnosis not present

## 2016-11-14 DIAGNOSIS — K7460 Unspecified cirrhosis of liver: Secondary | ICD-10-CM | POA: Diagnosis not present

## 2017-01-17 ENCOUNTER — Other Ambulatory Visit: Payer: Self-pay | Admitting: Family Medicine

## 2017-01-18 ENCOUNTER — Other Ambulatory Visit: Payer: Self-pay | Admitting: Family Medicine

## 2017-03-12 ENCOUNTER — Other Ambulatory Visit: Payer: Self-pay | Admitting: Urology

## 2017-03-14 ENCOUNTER — Encounter: Payer: Self-pay | Admitting: Family Medicine

## 2017-03-14 ENCOUNTER — Ambulatory Visit (INDEPENDENT_AMBULATORY_CARE_PROVIDER_SITE_OTHER): Payer: Managed Care, Other (non HMO) | Admitting: Family Medicine

## 2017-03-14 VITALS — BP 155/88 | HR 62 | Temp 98.3°F | Resp 18 | Ht 77.0 in | Wt 213.8 lb

## 2017-03-14 DIAGNOSIS — I10 Essential (primary) hypertension: Secondary | ICD-10-CM

## 2017-03-14 DIAGNOSIS — K7460 Unspecified cirrhosis of liver: Secondary | ICD-10-CM | POA: Diagnosis not present

## 2017-03-14 DIAGNOSIS — D696 Thrombocytopenia, unspecified: Secondary | ICD-10-CM | POA: Diagnosis not present

## 2017-03-14 DIAGNOSIS — R600 Localized edema: Secondary | ICD-10-CM

## 2017-03-14 DIAGNOSIS — G2581 Restless legs syndrome: Secondary | ICD-10-CM | POA: Diagnosis not present

## 2017-03-14 LAB — POCT CBC
GRANULOCYTE PERCENT: 74 % (ref 37–80)
HCT, POC: 48.5 % (ref 43.5–53.7)
HEMOGLOBIN: 15.5 g/dL (ref 14.1–18.1)
Lymph, poc: 0.9 (ref 0.6–3.4)
MCH: 25.6 pg — AB (ref 27–31.2)
MCHC: 32 g/dL (ref 31.8–35.4)
MCV: 80.1 fL (ref 80–97)
MID (CBC): 0.1 (ref 0–0.9)
MPV: 9.7 fL (ref 0–99.8)
PLATELET COUNT, POC: 73 10*3/uL — AB (ref 142–424)
POC Granulocyte: 3 (ref 2–6.9)
POC LYMPH PERCENT: 23.6 %L (ref 10–50)
POC MID %: 2.4 % (ref 0–12)
RBC: 6.06 M/uL (ref 4.69–6.13)
RDW, POC: 17.9 %
WBC: 4 10*3/uL — AB (ref 4.6–10.2)

## 2017-03-14 LAB — POCT URINALYSIS DIP (MANUAL ENTRY)
BILIRUBIN UA: NEGATIVE mg/dL
Bilirubin, UA: NEGATIVE
Blood, UA: NEGATIVE
GLUCOSE UA: NEGATIVE mg/dL
LEUKOCYTES UA: NEGATIVE
Nitrite, UA: NEGATIVE
PROTEIN UA: NEGATIVE mg/dL
Spec Grav, UA: 1.01 (ref 1.010–1.025)
Urobilinogen, UA: 0.2 E.U./dL
pH, UA: 6 (ref 5.0–8.0)

## 2017-03-14 MED ORDER — SPIRONOLACTONE 100 MG PO TABS: 100.0000 mg | ORAL_TABLET | Freq: Every day | ORAL | 1 refills | Status: DC

## 2017-03-14 MED ORDER — FUROSEMIDE 40 MG PO TABS: 40.0000 mg | ORAL_TABLET | Freq: Every day | ORAL | 1 refills | Status: DC

## 2017-03-14 MED ORDER — GABAPENTIN 100 MG PO CAPS: 100.0000 mg | ORAL_CAPSULE | Freq: Two times a day (BID) | ORAL | 1 refills | Status: DC

## 2017-03-14 MED ORDER — ROPINIROLE HCL 1 MG PO TABS: 1.0000 mg | ORAL_TABLET | Freq: Two times a day (BID) | ORAL | 0 refills | Status: DC

## 2017-03-14 MED ORDER — TRAZODONE HCL 50 MG PO TABS: ORAL_TABLET | ORAL | 0 refills | Status: DC

## 2017-03-14 MED ORDER — ALBUTEROL SULFATE 108 (90 BASE) MCG/ACT IN AEPB: 2.0000 | INHALATION_SPRAY | RESPIRATORY_TRACT | 5 refills | Status: DC

## 2017-03-14 NOTE — Patient Instructions (Addendum)
i  IF you received an x-ray today, you will receive an invoice from [facility]. Please contact [facility] at [phone] with questions or concerns regarding your invoice.   IF you received labwork today, you will receive an invoice from [facility]. Please contact [facility] at [phone] with questions or concerns regarding your invoice.   Our billing staff will not be able to assist you with questions regarding bills from these companies.  You will be contacted with the lab results as soon as they are available. The fastest way to get your results is to activate your My Chart account. Instructions are located on the last page of this paperwork. If you have not heard from us regarding the results in 2 weeks, please contact this office.      Low-Sodium Eating Plan Sodium, which is an element that makes up salt, helps you maintain a healthy balance of fluids in your body. Too much sodium can increase your blood pressure and cause fluid and waste to be held in your body. Your health care provider or dietitian may recommend following this plan if you have high blood pressure (hypertension), kidney disease, liver disease, or heart failure. Eating less sodium can help lower your blood pressure, reduce swelling, and protect your heart, liver, and kidneys. What are tips for following this plan? General guidelines  Most people on this plan should limit their sodium intake to 1,500-2,000 mg (milligrams) of sodium each day. Reading food labels  The Nutrition Facts label lists the amount of sodium in one serving of the food. If you eat more than one serving, you must multiply the listed amount of sodium by the number of servings.  Choose foods with less than 140 mg of sodium per serving.  Avoid foods with 300 mg of sodium or more per serving. Shopping  Look for lower-sodium products, often labeled as "low-sodium" or "no salt added."  Always check the sodium content even if  foods are labeled as "unsalted" or "no salt added".  Buy fresh foods. ? Avoid canned foods and premade or frozen meals. ? Avoid canned, cured, or processed meats  Buy breads that have less than 80 mg of sodium per slice. Cooking  Eat more home-cooked food and less restaurant, buffet, and fast food.  Avoid adding salt when cooking. Use salt-free seasonings or herbs instead of table salt or sea salt. Check with your health care provider or pharmacist before using salt substitutes.  Cook with plant-based oils, such as canola, sunflower, or olive oil. Meal planning  When eating at a restaurant, ask that your food be prepared with less salt or no salt, if possible.  Avoid foods that contain MSG (monosodium glutamate). MSG is sometimes added to [place] food, bouillon, and some canned foods. What foods are recommended? The items listed may not be a complete list. Talk with your dietitian about what dietary choices are best for you. Grains Low-sodium cereals, including oats, puffed wheat and rice, and shredded wheat. Low-sodium crackers. Unsalted rice. Unsalted pasta. Low-sodium bread. Whole-grain breads and whole-grain pasta. Vegetables Fresh or frozen vegetables. "No salt added" canned vegetables. "No salt added" tomato sauce and paste. Low-sodium or reduced-sodium tomato and vegetable juice. Fruits Fresh, frozen, or canned fruit. Fruit juice. Meats and other protein foods Fresh or frozen (no salt added) meat, poultry, seafood, and fish. Low-sodium canned tuna and salmon. Unsalted nuts. Dried peas, beans, and lentils without added salt. Unsalted canned beans. Eggs. Unsalted nut butters. Dairy Milk. Soy milk. Cheese that  is naturally low in sodium, such as ricotta cheese, fresh mozzarella, or Swiss cheese Low-sodium or reduced-sodium cheese. Cream cheese. Yogurt. Fats and oils Unsalted butter. Unsalted margarine with no trans fat. Vegetable oils such as canola or olive oils. Seasonings and  other foods Fresh and dried herbs and spices. Salt-free seasonings. Low-sodium mustard and ketchup. Sodium-free salad dressing. Sodium-free light mayonnaise. Fresh or refrigerated horseradish. Lemon juice. Vinegar. Homemade, reduced-sodium, or low-sodium soups. Unsalted popcorn and pretzels. Low-salt or salt-free chips. What foods are not recommended? The items listed may not be a complete list. Talk with your dietitian about what dietary choices are best for you. Grains Instant hot cereals. Bread stuffing, pancake, and biscuit mixes. Croutons. Seasoned rice or pasta mixes. Noodle soup cups. Boxed or frozen macaroni and cheese. Regular salted crackers. Self-rising flour. Vegetables Sauerkraut, pickled vegetables, and relishes. Olives. [place] fries. Onion rings. Regular canned vegetables (not low-sodium or reduced-sodium). Regular canned tomato sauce and paste (not low-sodium or reduced-sodium). Regular tomato and vegetable juice (not low-sodium or reduced-sodium). Frozen vegetables in sauces. Meats and other protein foods Meat or fish that is salted, canned, smoked, spiced, or pickled. Bacon, ham, sausage, hotdogs, corned beef, chipped beef, packaged lunch meats, salt pork, jerky, pickled herring, anchovies, regular canned tuna, sardines, salted nuts. Dairy Processed cheese and cheese spreads. Cheese curds. Blue cheese. Feta cheese. String cheese. Regular cottage cheese. Buttermilk. Canned milk. Fats and oils Salted butter. Regular margarine. Ghee. Bacon fat. Seasonings and other foods Onion salt, garlic salt, seasoned salt, table salt, and sea salt. Canned and packaged gravies. Worcestershire sauce. Tartar sauce. Barbecue sauce. Teriyaki sauce. Soy sauce, including reduced-sodium. Steak sauce. Fish sauce. Oyster sauce. Cocktail sauce. Horseradish that you find on the shelf. Regular ketchup and mustard. Meat flavorings and tenderizers. Bouillon cubes. Hot sauce and Tabasco sauce. Premade or packaged  marinades. Premade or packaged taco seasonings. Relishes. Regular salad dressings. Salsa. Potato and tortilla chips. Corn chips and puffs. Salted popcorn and pretzels. Canned or dried soups. Pizza. Frozen entrees and pot pies. Summary  Eating less sodium can help lower your blood pressure, reduce swelling, and protect your heart, liver, and kidneys.  Most people on this plan should limit their sodium intake to 1,500-2,000 mg (milligrams) of sodium each day.  Canned, boxed, and frozen foods are high in sodium. Restaurant foods, fast foods, and pizza are also very high in sodium. You also get sodium by adding salt to food.  Try to cook at home, eat more fresh fruits and vegetables, and eat less fast food, canned, processed, or prepared foods. This information is not intended to replace advice given to you by your health care provider. Make sure you discuss any questions you have with your health care provider. Document Released: 01/13/2002 Document Revised: 07/17/2016 Document Reviewed: 07/17/2016 Elsevier Interactive Patient Education  2017 [employer].

## 2017-03-14 NOTE — Progress Notes (Signed)
Subjective:  By signing my name below, I, Essence Howell, attest that this documentation has been prepared under the direction and in the presence of Norberto Sorenson, MD Electronically Signed: Charline Bills, ED Scribe 03/14/2017 at 10:24 AM.   Patient ID: Ronald Zhang, male    DOB: 1956/03/07, 61 y.o.   MRN: [id number]  Chief Complaint  Patient presents with  . RLS  . Follow-up   HPI Ronald Zhang is a 61 y.o. male who presents to Primary Care at [facility] for a follow-up on RLS. Last seen 1 year prior. 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 Has been seen by a GI in [facility] by Dr. Jennye Boroughs last visit October 2016. Last year his platelets had dropped to the lowest at 68 with a new leukopenia so pt was requested recheck within 1 month which he did not. Transaminese normal though bilirubin starting to increase and INR/PT slightly elevated.   Pt is fasting at this visit. Pt has not seen Dr. Jennye Boroughs since 2016 and reports intermittent constipation but states that he has been doing well overall. Pt states that he still has not had an acholic beverage since 05/07/13. Denies vomiting, nausea, heartburn, abdominal distention, clay-colored stool colored, dark colored urine, loss of appetite, weight change. States he is no longer taking diuretic.  SOB He has noticed some sob which he has been treating with an inhaler.   Sleep Disturbances Pt states that he is only sleeping 4 hours/night due to working third shift. He is taking Trazodone 1 in the AM and 1 in the PM and Requip 1 in the AM and 1 in the PM. However, he states he does not feel as if the Requip in the evening lasts long enough. He denies leg pain with walking but has noticed leg pain after riding his stationary bicycle.   Past Medical History:  Diagnosis Date  . Alcohol dependency (HCC)    stopped drinking since November 09, 2012, restarted drinking on 01/24/13    . Allergy   . Anemia    iron deficiency, non compliant with iron  . Arthritis    bilateral knees,secondary to trauma  . Cellulitis and abscess of leg    Hospitalized at [facility] from 4/5-4/10/14 for bilateral LE edema and cellulitis. Was on IV Lasix and Vancomycin.   . Erectile dysfunction   . H/O non anemic vitamin B12 deficiency 2014  . Hypertension   . Insomnia    3rd shift   . Non-rheumatic mitral regurgitation    Asymptomatic, Seen by cardiology 01/26/15, Dr Norman Herrlich, [facility]   . Pancreatic cyst    Multiple, MRI of abd and pelvis in 12/2013  , ? sequelae of chronic pancreatitis , he has a hx of alcoholism, suggested repeat MRI in 6 months fro progression, appears nonmalignant  . Pulmonary HTN (HCC)    TEE on March 31,2014-mild-mod concentric hypertrophy.  EF 60-65%. Left atrial valve mildly dilated, right atrium mildly dilated, mild-moderate MR, mild TR. Pum Pressure 51 mm Hg  . Restless leg   . Urinary incontinence    Current Outpatient Prescriptions on File Prior to Visit  Medication Sig Dispense Refill  . gabapentin (NEURONTIN) 100 MG capsule Take 1-2 capsules (100-200 mg total) by mouth 2 (two) times daily. 360 capsule 1  . lisinopril (PRINIVIL,ZESTRIL) 20 MG tablet TAKE 1 TABLET(20 MG) BY MOUTH TWICE DAILY 180 tablet 3  . PROAIR RESPICLICK 108 (90 Base) MCG/ACT  AEPB INHALE 2 PUFFS INTO THE LUNGS EVERY 4 HOURS AS NEEDED 1 each 2  . rOPINIRole (REQUIP) 0.5 MG tablet 1 tab po bid for restless legs 180 tablet 1  . rOPINIRole (REQUIP) 0.5 MG tablet TAKE 1 TABLET(0.5 MG) BY MOUTH TWICE DAILY 180 tablet 0  . traMADol (ULTRAM) 50 MG tablet TK 1 T PO Q 12 H PRN  0  . traZODone (DESYREL) 50 MG tablet TAKE 1 TABLET(50 MG) BY MOUTH AT BEDTIME 90 tablet 0  . VITAMIN D, CHOLECALCIFEROL, PO Take by mouth.    Marland Kitchen. omeprazole (PRILOSEC) 20 MG capsule TK 1 C PO QAM  0  . vardenafil (LEVITRA) 20 MG tablet Take 1 tablet (20 mg total) by mouth daily as needed for  erectile dysfunction. (Patient not taking: Reported on 03/14/2017) 10 tablet 2   No current facility-administered medications on file prior to visit.    Allergies  Allergen Reactions  . Penicillins Swelling    Childhood   Past Surgical History:  Procedure Laterality Date  . FRACTURE SURGERY Bilateral 1972   below knee  . HEMORRHOID SURGERY    . 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 Loralie Champagneurrani   Family History  Problem Relation Age of Onset  . Hypertension Mother   . Hypertension Father   . Alcohol abuse Brother    Social History   Social History  . Marital status: Divorced    Spouse name: n/a  . Number of children: 2  . 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 Smoking status: Never Smoker  . Smokeless tobacco: Never Used  . Alcohol use No     Comment: history of a pint a day, sober since 05/07/2013  . Drug use: No  . Sexual activity: No   Other Topics Concern  . None   Social History Narrative   05/07/2013 AHW "[name]" was born [place]. He has 3 younger brothers. His parents are still together. He reports he had a good childhood. He graduated from high school. 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 He married once, and has been divorced for 30 years. He has twins, a boy and girl. He denies any legal difficulties. His hobbies include family genealogy, studying Southern history, and participating in Civil War reenactments. 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 His social support system consists of friends. 05/07/2013 AHW   [facility] 03/14/2017 03/20/2016 01/22/2016 01/17/2016 12/10/2015  Decreased Interest 0 0 0 0 0  Down, Depressed, Hopeless 0 0 0 0 0  PHQ - 2 Score 0 0 0 0 0    Review of Systems  Constitutional: Negative for appetite change and unexpected weight change.  Respiratory: Positive for shortness of breath.   Gastrointestinal: Positive for  constipation (intermittent). Negative for abdominal distention, nausea and vomiting.  Musculoskeletal: Positive for myalgias.  Psychiatric/Behavioral: Positive for sleep disturbance.      Objective:   Physical Exam  Constitutional: He is oriented to person, place, and time. He appears well-developed and well-nourished. No distress.  HENT:  Head: Normocephalic and atraumatic.  Eyes: Conjunctivae and EOM are normal.  Neck: Neck supple. No tracheal deviation present.  Cardiovascular: Normal rate, regular rhythm, S1 normal, S2 normal and normal heart sounds.   Pulses:      Dorsalis pedis pulses are 2+ on the right side, and 1+ on the left side.  Posterior tibial pulses are 2+ on the right side.  L: Trace PT  Pulmonary/Chest: Effort normal. No respiratory distress. He has rales.  R greater than L fine inspiratory bibasilar rales.  Abdominal: Soft. Bowel sounds are normal. He exhibits distension (mild).  Question of mild ascites.  Musculoskeletal: Normal range of motion. He exhibits edema.  LLE: 1+ pitting edema RLE: Trace pitting edema  Neurological: He is alert and oriented to person, place, and time.  Skin: Skin is warm and dry.  Skin with modeled hyperpigmentation and dry  Psychiatric: He has a normal mood and affect. His behavior is normal.  Nursing note and vitals reviewed.  BP (!) 164/86   Pulse 62   Temp 98.3 F (36.8 C) (Oral)   Resp 18   Ht 6\' 5"  (1.956 m)   Wt 213 lb 12.8 oz (97 kg)   SpO2 98%   BMI 25.35 kg/m     Results for orders placed or performed in visit on 03/14/17  POCT urinalysis dipstick  Result Value Ref Range   Color, UA yellow yellow   Clarity, UA clear clear   Glucose, UA negative negative mg/dL   Bilirubin, UA negative negative   Ketones, POC UA negative negative mg/dL   Spec Grav, UA 4.098 1.191 - 1.025   Blood, UA negative negative   pH, UA 6.0 5.0 - 8.0   Protein Ur, POC negative negative mg/dL   Urobilinogen, UA 0.2 0.2 or 1.0 E.U./dL    Nitrite, UA Negative Negative   Leukocytes, UA Negative Negative  POCT CBC  Result Value Ref Range   WBC 4.0 (A) 4.6 - 10.2 K/uL   Lymph, poc 0.9 0.6 - 3.4   POC LYMPH PERCENT 23.6 10 - 50 %L   MID (cbc) 0.1 0 - 0.9   POC MID % 2.4 0 - 12 %M   POC Granulocyte 3.0 2 - 6.9   Granulocyte percent 74.0 37 - 80 %G   RBC 6.06 4.69 - 6.13 M/uL   Hemoglobin 15.5 14.1 - 18.1 g/dL   HCT, POC 47.8 29.5 - 53.7 %   MCV 80.1 80 - 97 fL   MCH, POC 25.6 (A) 27 - 31.2 pg   MCHC 32.0 31.8 - 35.4 g/dL   RDW, POC 62.1 %   Platelet Count, POC 73 (A) 142 - 424 K/uL   MPV 9.7 0 - 99.8 fL   Assessment & Plan:   1. Cirrhosis of liver without ascites, unspecified hepatic cirrhosis type (HCC) - concern for signs of liver failure with last labs showing elevated bili, slightly high inr, droping platelets and pt w/ more s/sxs of volume overload with pedal edema and worsening SHoB/DOE so will start pt on spironolactone/lasix combo of 100:40 per UTD recs for cirrhotic edema/ascites - however, will need to monitor closely to avoid dehydration and pre-renal azotemia triggering a hepatorenal syndrome so recheck within 1 mo with repeat cmp.  Pt has not had f/u with GI in 2 yrs - make appt, recheck liver US  2. Restless leg - worsening - try increasing requip  3. Essential hypertension - uncontrolled; starting spironolactone/lasix so stop lisinopril 20 for now  4. Thrombocytopenia (HCC) - stable today  5. Bilateral edema of lower extremity -  starting spironolactone/lasix - keep at 100/40 ratio if need to change dosing.     Orders Placed This Encounter  Procedures  . US ABDOMEN COMPLETE W/ELASTOGRAPHY    Standing Status:   Future    Standing Expiration Date:  05/14/2018    Order Specific Question:   Reason for Exam (SYMPTOM  OR DIAGNOSIS REQUIRED)    Answer:   cirrhosis, concern for worseinng with edema, ascitis, and developing liver failure on labs    Order Specific Question:   Preferred imaging location?     Answer:   [facility]  . Comprehensive metabolic panel  . APTT  . Protime-INR  . Ferritin  . Comprehensive metabolic panel  . Protime-INR  . APTT  . Care order/instruction:    Scheduling Instructions:     Recheck BP  . POCT urinalysis dipstick  . POCT CBC    Meds ordered this encounter  Medications  . rOPINIRole (REQUIP) 1 MG tablet    Sig: Take 1 tablet (1 mg total) by mouth 2 (two) times daily.    Dispense:  180 tablet    Refill:  0  . spironolactone (ALDACTONE) 100 MG tablet    Sig: Take 1 tablet (100 mg total) by mouth daily.    Dispense:  30 tablet    Refill:  1  . furosemide (LASIX) 40 MG tablet    Sig: Take 1 tablet (40 mg total) by mouth daily.    Dispense:  30 tablet    Refill:  1  . Albuterol Sulfate (PROAIR RESPICLICK) 108 (90 Base) MCG/ACT AEPB    Sig: Inhale 2 puffs into the lungs every 4 (four) hours.    Dispense:  1 each    Refill:  5  . gabapentin (NEURONTIN) 100 MG capsule    Sig: Take 1-2 capsules (100-200 mg total) by mouth 2 (two) times daily.    Dispense:  360 capsule    Refill:  1  . traZODone (DESYREL) 50 MG tablet    Sig: TAKE 1 TABLET(50 MG) BY MOUTH AT BEDTIME    Dispense:  90 tablet    Refill:  0    Office visit needed   Over 40 min spent in face-to-face evaluation of and consultation with patient and coordination of care.  Over 50% of this time was spent counseling this patient.   I personally performed the services described in this documentation, which was scribed in my presence. The recorded information has been reviewed and considered, and addended by me as needed.   Norberto Sorenson, M.D.  Primary Care at [facility] [address] [place], [place] [address] [phone] phone [phone] fax  03/20/17 10:25 PM

## 2017-03-15 LAB — COMPREHENSIVE METABOLIC PANEL
ALK PHOS: 119 IU/L — AB (ref 39–117)
ALT: 21 IU/L (ref 0–44)
AST: 26 IU/L (ref 0–40)
Albumin/Globulin Ratio: 1.8 (ref 1.2–2.2)
Albumin: 4.4 g/dL (ref 3.6–4.8)
BILIRUBIN TOTAL: 0.9 mg/dL (ref 0.0–1.2)
BUN/Creatinine Ratio: 19 (ref 10–24)
BUN: 18 mg/dL (ref 8–27)
CO2: 22 mmol/L (ref 20–29)
Calcium: 9.1 mg/dL (ref 8.6–10.2)
Chloride: 107 mmol/L — ABNORMAL HIGH (ref 96–106)
Creatinine, Ser: 0.96 mg/dL (ref 0.76–1.27)
GFR calc Af Amer: 98 mL/min/{1.73_m2} (ref >59)
GFR calc non Af Amer: 84 mL/min/{1.73_m2} (ref >59)
GLUCOSE: 84 mg/dL (ref 65–99)
Globulin, Total: 2.5 g/dL (ref 1.5–4.5)
Potassium: 4.5 mmol/L (ref 3.5–5.2)
Sodium: 144 mmol/L (ref 134–144)
Total Protein: 6.9 g/dL (ref 6.0–8.5)

## 2017-03-15 LAB — PROTIME-INR
INR: 1.2 (ref 0.8–1.2)
PROTHROMBIN TIME: 12.6 s — AB (ref 9.1–12.0)

## 2017-03-15 LAB — FERRITIN: FERRITIN: 23 ng/mL — AB (ref 30–400)

## 2017-03-15 LAB — APTT: APTT: 31 s (ref 24–33)

## 2017-03-20 ENCOUNTER — Encounter: Payer: Self-pay | Admitting: Family Medicine

## 2017-03-20 DIAGNOSIS — D696 Thrombocytopenia, unspecified: Secondary | ICD-10-CM | POA: Insufficient documentation

## 2017-03-21 ENCOUNTER — Telehealth: Payer: Self-pay | Admitting: Family Medicine

## 2017-03-21 NOTE — Telephone Encounter (Signed)
Pt is wanting to let Dr. Clelia CroftShaw know that he has lost 5 pounds and his blood pressure has been 115/65 - 120/70 and he is doing well  Best number [phone]

## 2017-03-29 NOTE — Telephone Encounter (Signed)
Perfect. Thank you!

## 2017-04-11 ENCOUNTER — Ambulatory Visit (INDEPENDENT_AMBULATORY_CARE_PROVIDER_SITE_OTHER): Payer: Managed Care, Other (non HMO)

## 2017-04-11 ENCOUNTER — Encounter: Payer: Self-pay | Admitting: Family Medicine

## 2017-04-11 ENCOUNTER — Ambulatory Visit (INDEPENDENT_AMBULATORY_CARE_PROVIDER_SITE_OTHER): Payer: Managed Care, Other (non HMO) | Admitting: Family Medicine

## 2017-04-11 VITALS — BP 124/79 | HR 79 | Temp 97.6°F | Resp 18 | Ht 77.0 in | Wt 210.0 lb

## 2017-04-11 DIAGNOSIS — F5101 Primary insomnia: Secondary | ICD-10-CM

## 2017-04-11 DIAGNOSIS — I272 Pulmonary hypertension, unspecified: Secondary | ICD-10-CM | POA: Diagnosis not present

## 2017-04-11 DIAGNOSIS — R0602 Shortness of breath: Secondary | ICD-10-CM

## 2017-04-11 DIAGNOSIS — E611 Iron deficiency: Secondary | ICD-10-CM | POA: Diagnosis not present

## 2017-04-11 DIAGNOSIS — K7460 Unspecified cirrhosis of liver: Secondary | ICD-10-CM

## 2017-04-11 DIAGNOSIS — I517 Cardiomegaly: Secondary | ICD-10-CM

## 2017-04-11 DIAGNOSIS — IMO0001 Reserved for inherently not codable concepts without codable children: Secondary | ICD-10-CM

## 2017-04-11 DIAGNOSIS — R911 Solitary pulmonary nodule: Secondary | ICD-10-CM | POA: Diagnosis not present

## 2017-04-11 DIAGNOSIS — Z5181 Encounter for therapeutic drug level monitoring: Secondary | ICD-10-CM

## 2017-04-11 DIAGNOSIS — I10 Essential (primary) hypertension: Secondary | ICD-10-CM

## 2017-04-11 DIAGNOSIS — J4530 Mild persistent asthma, uncomplicated: Secondary | ICD-10-CM

## 2017-04-11 DIAGNOSIS — R161 Splenomegaly, not elsewhere classified: Secondary | ICD-10-CM

## 2017-04-11 DIAGNOSIS — G2581 Restless legs syndrome: Secondary | ICD-10-CM

## 2017-04-11 DIAGNOSIS — Z23 Encounter for immunization: Secondary | ICD-10-CM

## 2017-04-11 DIAGNOSIS — I868 Varicose veins of other specified sites: Secondary | ICD-10-CM

## 2017-04-11 MED ORDER — BUDESONIDE-FORMOTEROL FUMARATE 160-4.5 MCG/ACT IN AERO: 2.0000 | INHALATION_SPRAY | Freq: Two times a day (BID) | RESPIRATORY_TRACT | 3 refills | Status: DC

## 2017-04-11 MED ORDER — FERROUS SULFATE 325 (65 FE) MG PO TABS: 325.0000 mg | ORAL_TABLET | Freq: Every day | ORAL | 1 refills | Status: DC

## 2017-04-11 MED ORDER — SPIRONOLACTONE 100 MG PO TABS: 100.0000 mg | ORAL_TABLET | Freq: Every day | ORAL | 1 refills | Status: DC

## 2017-04-11 MED ORDER — ROPINIROLE HCL 2 MG PO TABS: 2.0000 mg | ORAL_TABLET | Freq: Two times a day (BID) | ORAL | 0 refills | Status: DC

## 2017-04-11 MED ORDER — FUROSEMIDE 40 MG PO TABS
40.0000 mg | ORAL_TABLET | Freq: Every day | ORAL | 1 refills | Status: DC
Start: 2017-04-11 — End: 2017-11-05

## 2017-04-11 MED ORDER — ALBUTEROL SULFATE (2.5 MG/3ML) 0.083% IN NEBU
2.5000 mg | INHALATION_SOLUTION | Freq: Once | RESPIRATORY_TRACT | Status: AC
  Administered 2017-04-11: 2.5 mg via RESPIRATORY_TRACT

## 2017-04-11 NOTE — Patient Instructions (Signed)
Start an iron supplement once a day - I recommend ferrous sulfate 342m (631mof elemental iron).  Take this on an empty stomach 30 minutes before eating or 2 hours after a meal.  Take it with a vitamin C supplement or a small glass of orange juice.   Iron Deficiency Anemia, Adult Iron deficiency anemia is a condition in which the concentration of red blood cells or hemoglobin in the blood is below normal because of too little iron. Hemoglobin is a substance in red blood cells that carries oxygen to the body's tissues. When the concentration of red blood cells or hemoglobin is too low, not enough oxygen reaches these tissues. Iron deficiency anemia is usually long-lasting (chronic) and it develops over time. It may or may not cause symptoms. It is a common type of anemia. What are the causes? This condition may be caused by:  Not enough iron in the diet.  Blood loss caused by bleeding in the intestine.  Blood loss from a gastrointestinal condition like Crohn disease.  Frequent blood draws, such as from blood donation.  Abnormal absorption in the gut.  Heavy menstrual periods in women.  Cancers of the gastrointestinal system, such as colon cancer.  What are the signs or symptoms? Symptoms of this condition may include:  Fatigue.  Headache.  Pale skin, lips, and nail beds.  Poor appetite.  Weakness.  Shortness of breath.  Dizziness.  Cold hands and feet.  Fast or irregular heartbeat.  Irritability. This is more common in severe anemia.  Rapid breathing. This is more common in severe anemia.  Mild anemia may not cause any symptoms. How is this diagnosed? This condition is diagnosed based on:  Your medical history.  A physical exam.  Blood tests.  You may have additional tests to find the underlying cause of your anemia, such as:  Testing for blood in the stool (fecal occult blood test).  A procedure to see inside your colon and rectum (colonoscopy).  A  procedure to see inside your esophagus and stomach (endoscopy).  A test in which cells are removed from bone marrow (bone marrow aspiration) or fluid is removed from the bone marrow to be examined (biopsy). This is rarely needed.  How is this treated? This condition is treated by correcting the cause of your iron deficiency. Treatment may involve:  Adding iron-rich foods to your diet.  Taking iron supplements. If you are pregnant or breastfeeding, you may need to take extra iron because your normal diet usually does not provide the amount of iron that you need.  Increasing vitamin C intake. Vitamin C helps your body absorb iron. Your health care provider may recommend that you take iron supplements along with a glass of orange juice or a vitamin C supplement.  Medicines to make heavy menstrual flow lighter.  Surgery.  You may need repeat blood tests to determine whether treatment is working. Depending on the underlying cause, the anemia should be corrected within 2 months of starting treatment. If the treatment does not seem to be working, you may need more testing. Follow these instructions at home: Medicines  Take over-the-counter and prescription medicines only as told by your health care provider. This includes iron supplements and vitamins.  If you cannot tolerate taking iron supplements by mouth, talk with your health care provider about taking them through a vein (intravenously) or an injection into a muscle.  For the best iron absorption, you should take iron supplements when your stomach is empty. If  you cannot tolerate them on an empty stomach, you may need to take them with food.  Do not drink milk or take antacids at the same time as your iron supplements. Milk and antacids may interfere with iron absorption.  Iron supplements can cause constipation. To prevent constipation, include fiber in your diet as told by your health care provider. A stool softener may also be  recommended. Eating and drinking  Talk with your health care provider before changing your diet. He or she may recommend that you eat foods that contain a lot of iron, such as: ? Liver. ? Low-fat (lean) beef. ? Breads and cereals that have iron added to them (are fortified). ? Eggs. ? Dried fruit. ? Dark green, leafy vegetables.  To help your body use the iron from iron-rich foods, eat those foods at the same time as fresh fruits and vegetables that are high in vitamin C. Foods that are high in vitamin C include: ? Oranges. ? Peppers. ? Tomatoes. ? Mangoes.  Drinkenoughfluid to keep your urine clear or pale yellow. General instructions  Return to your normal activities as told by your health care provider. Ask your health care provider what activities are safe for you.  Practice good hygiene. Anemia can make you more prone to illness and infection.  Keep all follow-up visits as told by your health care provider. This is important. Contact a health care provider if:  You feel nauseous or you vomit.  You feel weak.  You have unexplained sweating.  You develop symptoms of constipation, such as: ? Having fewer than three bowel movements a week. ? Straining to have a bowel movement. ? Having stools that are hard, dry, or larger than normal. ? Feeling full or bloated. ? Pain in the lower abdomen. ? Not feeling relief after having a bowel movement. Get help right away if:  You faint. If this happens, do not drive yourself to the hospital. Call your local emergency services (911 in the [place]  You have chest pain.  You have shortness of breath that: ? Is severe. ? Gets worse with physical activity.  You have a rapid heartbeat.  You become light-headed when getting up from a sitting or lying down position. This information is not intended to replace advice given to you by your health care provider. Make sure you discuss any questions you have with your health care  provider. Document Released: 07/21/2000 Document Revised: 04/12/2016 Document Reviewed: 04/12/2016 Elsevier Interactive Patient Education  2018 [employer].  Iron-Rich Diet Iron is a mineral that helps your body to produce hemoglobin. Hemoglobin is a protein in your red blood cells that carries oxygen to your body's tissues. Eating too little iron may cause you to feel weak and tired, and it can increase your risk for infection. Eating enough iron is necessary for your body's metabolism, muscle function, and nervous system. Iron is naturally found in many foods. It can also be added to foods or fortified in foods. There are two types of dietary iron:  Heme iron. Heme iron is absorbed by the body more easily than nonheme iron. Heme iron is found in meat, poultry, and fish.  Nonheme iron. Nonheme iron is found in dietary supplements, iron-fortified grains, beans, and vegetables.  You may need to follow an iron-rich diet if:  You have been diagnosed with iron deficiency or iron-deficiency anemia.  You have a condition that prevents you from absorbing dietary iron, such as: ? Infection in your intestines. ? Celiac  disease. This involves long-lasting (chronic) inflammation of your intestines.  You do not eat enough iron.  You eat a diet that is high in foods that impair iron absorption.  You have lost a lot of blood.  You have heavy bleeding during your menstrual cycle.  You are pregnant.  What is my plan? Your health care provider may help you to determine how much iron you need per day based on your condition. Generally, when a person consumes sufficient amounts of iron in the diet, the following iron needs are met:  Men. ? 34-73 years old: 11 mg per day. ? 51-14 years old: 8 mg per day.  Women. ? 49-86 years old: 15 mg per day. ? 56-31 years old: 18 mg per day. ? Over 37 years old: 8 mg per day. ? Pregnant women: 27 mg per day. ? Breastfeeding women: 9 mg per day.  What do  I need to know about an iron-rich diet?  Eat fresh fruits and vegetables that are high in vitamin C along with foods that are high in iron. This will help increase the amount of iron that your body absorbs from food, especially with foods containing nonheme iron. Foods that are high in vitamin C include oranges, peppers, tomatoes, and mango.  Take iron supplements only as directed by your health care provider. Overdose of iron can be life-threatening. If you were prescribed iron supplements, take them with orange juice or a vitamin C supplement.  Cook foods in pots and pans that are made from iron.  Eat nonheme iron-containing foods alongside foods that are high in heme iron. This helps to improve your iron absorption.  Certain foods and drinks contain compounds that impair iron absorption. Avoid eating these foods in the same meal as iron-rich foods or with iron supplements. These include: ? Coffee, black tea, and red wine. ? Milk, dairy products, and foods that are high in calcium. ? Beans, soybeans, and peas. ? Whole grains.  When eating foods that contain both nonheme iron and compounds that impair iron absorption, follow these tips to absorb iron better. ? Soak beans overnight before cooking. ? Soak whole grains overnight and drain them before using. ? Ferment flours before baking, such as using yeast in bread dough. What foods can I eat? Grains Iron-fortified breakfast cereal. Iron-fortified whole-wheat bread. Enriched rice. Sprouted grains. Vegetables Spinach. Potatoes with skin. Green peas. Broccoli. Red and green bell peppers. Fermented vegetables. Fruits Prunes. Raisins. Oranges. Strawberries. Mango. Grapefruit. Meats and Other Protein Sources Beef liver. Oysters. Beef. Shrimp. [place]. Chicken. [place]. Sardines. Chickpeas. Nuts. Tofu. Beverages Tomato juice. Fresh orange juice. Prune juice. Hibiscus tea. Fortified instant breakfast shakes. Condiments Tahini. Fermented soy  sauce. Sweets and Desserts Black-strap molasses. Other Wheat germ. The items listed above may not be a complete list of recommended foods or beverages. Contact your dietitian for more options. What foods are not recommended? Grains Whole grains. Bran cereal. Bran flour. Oats. Vegetables Artichokes. Brussels sprouts. Kale. Fruits Blueberries. Raspberries. Strawberries. Figs. Meats and Other Protein Sources Soybeans. Products made from soy protein. Dairy Milk. Cream. Cheese. Yogurt. Cottage cheese. Beverages Coffee. Black tea. Red wine. Sweets and Desserts Cocoa. Chocolate. Ice cream. Other Basil. Oregano. Parsley. The items listed above may not be a complete list of foods and beverages to avoid. Contact your dietitian for more information. This information is not intended to replace advice given to you by your health care provider. Make sure you discuss any questions you have with your health care  provider. Document Released: 03/07/2005 Document Revised: 02/11/2016 Document Reviewed: 02/18/2014 Elsevier Interactive Patient Education  [employer].

## 2017-04-11 NOTE — Progress Notes (Addendum)
Subjective:  By signing my name below, I, [name], attest that this documentation has been prepared under the direction and in the presence of Norberto Sorenson, MD. Electronically Signed: [name], Scribe. 04/11/2017 , 2:33 PM .  Patient was seen in Room 2 .   Patient ID: Ronald Zhang, male    DOB: January 23, 1956, 61 y.o.   MRN: [id number] Chief Complaint  Patient presents with  . Medication Refill    spironolactone    HPI Ronald Zhang is a 61 y.o. male who presents to Primary Care at [facility] requesting medication refill of his spironolactone. Saw patient 1 month ago. 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 He had not seen GI in 2 years, and me in a year, despite my concern that his cirrhosis was starting to cause liver failure. He was noting pedal edema and shortness of breath, as well as worsening insomnia and restless legs. He was started on spironolactone 100mg  and Lasix 40mg ; asked to recheck in 1 month due to importance of avoiding dehydration which could result in hepatorenal syndrome. He had stopped lisinopril 20mg  and increased Requip. Abdominal US with Elastography was ordered, but not scheduled. Patient had called in after 1 week, noting lost 5 lbs in water weight and BP was dramatically improved in 110s-120/70.   Labs revealed low ferritin, normal LFT's including bilirubin, coags normal, and thrombocytopenia was stable. He had breakfast this morning.   He notes his BP has greatly improved, running in 110s/60s with heart rate in 70s. He denies feeling dizzy with position change. He states he possibly had low iron in the past. He denies eating much red meat, as he eats more [place] and chicken. He denies eating beans, or spinach. He mentions some constipation, denies using stool softener or high fiber. He denies much improvement with Requip. He sleeps about 4 hours a day during the week, and sometimes  more during the weekends. He stopped taking the aspirin about 2 weeks ago due to BP coming down. He denies any changes with shortness of breath or chest tightness; uses albuterol before he goes to sleep, and sometimes when he wakes up from sleeping- up to 3-4 times a day. He denies being informed snoring or apnea. He had childhood asthma but grew out of it. Chest xray done in 2014, which was normal.   No h/o tobacco use, no FHx pulm disease. + h/o childhood asthma that was quite severe.  Past Medical History:  Diagnosis Date  . Alcohol dependency (HCC)    stopped drinking since May 07, 2013  . Allergy   . Anemia    iron deficiency, non compliant with iron  . Arthritis    bilateral knees,secondary to trauma  . Cellulitis and abscess of leg    Hospitalized at [facility] from 4/5-4/10/14 for bilateral LE edema and cellulitis. Was on IV Lasix and Vancomycin.   . Erectile dysfunction   . H/O non anemic vitamin B12 deficiency 2014  . Hypertension   . Insomnia    3rd shift   . Non-rheumatic mitral regurgitation    Asymptomatic, Seen by cardiology 01/26/15, Dr Norman Herrlich, [facility]   . Pancreatic cyst    Multiple, MRI of abd and pelvis in 12/2013  , ? sequelae of chronic pancreatitis , he has a hx of alcoholism, suggested repeat MRI in 6 months fro progression, appears nonmalignant  . Pulmonary HTN (HCC)    TEE  on March 31,2014-mild-mod concentric hypertrophy.  EF 60-65%. Left atrial valve mildly dilated, right atrium mildly dilated, mild-moderate MR, mild TR. Pum Pressure 51 mm Hg  . Restless leg   . Urinary incontinence    Prior to Admission medications   Medication Sig Start Date End Date Taking? Authorizing Provider  Albuterol Sulfate (PROAIR RESPICLICK) 108 (90 Base) MCG/ACT AEPB Inhale 2 puffs into the lungs every 4 (four) hours. 03/14/17  Yes Sherren Mocha, MD  furosemide (LASIX) 40 MG tablet Take 1 tablet (40 mg total) by mouth daily. 03/14/17  Yes Sherren Mocha, MD   gabapentin (NEURONTIN) 100 MG capsule Take 1-2 capsules (100-200 mg total) by mouth 2 (two) times daily. 03/14/17  Yes Sherren Mocha, MD  omeprazole (PRILOSEC) 20 MG capsule TK 1 C PO QAM 01/07/16  Yes [provider]  rOPINIRole (REQUIP) 1 MG tablet Take 1 tablet (1 mg total) by mouth 2 (two) times daily. 03/14/17  Yes Sherren Mocha, MD  spironolactone (ALDACTONE) 100 MG tablet Take 1 tablet (100 mg total) by mouth daily. 03/14/17  Yes Sherren Mocha, MD  traMADol (ULTRAM) 50 MG tablet TK 1 T PO Q 12 H PRN 11/04/15  Yes [provider]  traZODone (DESYREL) 50 MG tablet TAKE 1 TABLET(50 MG) BY MOUTH AT BEDTIME 03/14/17  Yes Sherren Mocha, MD  VITAMIN D, CHOLECALCIFEROL, PO Take by mouth.   Yes [provider]   Allergies  Allergen Reactions  . Penicillins Swelling    Childhood   Review of Systems  Constitutional: Negative for fatigue and unexpected weight change.  Eyes: Negative for visual disturbance.  Respiratory: Negative for cough, chest tightness and shortness of breath.   Cardiovascular: Negative for chest pain, palpitations and leg swelling.  Gastrointestinal: Negative for abdominal pain and blood in stool.  Neurological: Negative for dizziness, light-headedness and headaches.       Objective:   Physical Exam  Constitutional: He is oriented to person, place, and time. He appears well-developed and well-nourished. No distress.  HENT:  Head: Normocephalic and atraumatic.  Eyes: Pupils are equal, round, and reactive to light. EOM are normal.  Neck: Neck supple.  Cardiovascular: Normal rate.   Pulmonary/Chest: Effort normal. No respiratory distress. He has wheezes (faint inspiratory and expiratory bilateral upper lobes with good air movement).  Musculoskeletal: Normal range of motion.  Neurological: He is alert and oriented to person, place, and time.  Skin: Skin is warm and dry.  Psychiatric: He has a normal mood and affect. His behavior is normal.  Nursing note and  vitals reviewed.   BP 124/79   Pulse 79   Temp 97.6 F (36.4 C) (Oral)   Resp 18   Ht 6\' 5"  (1.956 m)   Wt 210 lb (95.3 kg)   SpO2 94%   BMI 24.90 kg/m      Predicted peak flow 606 L/min; pre-neb peak flow 330 L/min, post-neb peak flow 450 L/min Assessment & Plan:   1. Essential hypertension   2. Cirrhosis of liver without ascites, unspecified hepatic cirrhosis type (HCC)   3. Restless leg   4. Primary insomnia   5. Iron deficiency   6. Medication monitoring encounter   7. Mild persistent asthma without complication   8. Shortness of breath   9. Pulmonary hypertension (HCC)   10. Mild concentric left ventricular hypertrophy (LVH)   11. Lung nodule < 6cm on CT   12. Solitary pulmonary nodule     Orders Placed This  Encounter  Procedures  . DG Chest 2 View    Standing Status:   Future    Number of Occurrences:   1    Standing Expiration Date:   04/11/2018    Order Specific Question:   Reason for Exam (SYMPTOM  OR DIAGNOSIS REQUIRED)    Answer:   worsening SHoB and wheezing, h/o childhood asthma    Order Specific Question:   Preferred imaging location?    Answer:   External  . CT Chest W Contrast    Standing Status:   Future    Standing Expiration Date:   06/11/2018    Order Specific Question:   If indicated for the ordered procedure, I authorize the administration of contrast media per Radiology protocol    Answer:   Yes    Order Specific Question:   Preferred imaging location?    Answer:   [facility]    Order Specific Question:   Radiology Contrast Protocol - do NOT remove file path    Answer:   [email or web address]    Order Specific Question:   Reason for Exam additional comments    Answer:   3.0 x 1.4 cm oval density overlying the posterior aspect of the midthoracic spine on CXR - concerning for lung neoplasm  . Pneumococcal polysaccharide vaccine 23-valent greater than or equal to 2yo subcutaneous/IM  . Comprehensive metabolic panel    . Care order/instruction:    Scheduling Instructions:     Peak Flow (IF NEB IS ORDERED PLEASE DO BEFORE AND AFTER NEB)  . ECHOCARDIOGRAM COMPLETE    Standing Status:   Future    Standing Expiration Date:   07/11/2018    Order Specific Question:   Where should this test be performed    Answer:   [facility]    Order Specific Question:   Does the patient weigh less than or greater than 250 lbs?    Answer:   Patient weighs less than 250 lbs    Order Specific Question:   Perflutren DEFINITY (image enhancing agent) should be administered unless hypersensitivity or allergy exist    Answer:   Administer Perflutren    Order Specific Question:   Expected Date:    Answer:   1 month    Meds ordered this encounter  Medications  . ferrous sulfate 325 (65 FE) MG tablet    Sig: Take 1 tablet (325 mg total) by mouth daily.    Dispense:  90 tablet    Refill:  1  . albuterol (PROVENTIL) (2.5 MG/3ML) 0.083% nebulizer solution 2.5 mg  . spironolactone (ALDACTONE) 100 MG tablet    Sig: Take 1 tablet (100 mg total) by mouth daily.    Dispense:  90 tablet    Refill:  1  . furosemide (LASIX) 40 MG tablet    Sig: Take 1 tablet (40 mg total) by mouth daily.    Dispense:  90 tablet    Refill:  1  . rOPINIRole (REQUIP) 2 MG tablet    Sig: Take 1 tablet (2 mg total) by mouth 2 (two) times daily.    Dispense:  180 tablet    Refill:  0  . budesonide-formoterol (SYMBICORT) 160-4.5 MCG/ACT inhaler    Sig: Inhale 2 puffs into the lungs 2 (two) times daily.    Dispense:  1 Inhaler    Refill:  3    I personally performed the services described in this documentation, which was scribed in my presence. The recorded  information has been reviewed and considered, and addended by me as needed.   Norberto Sorenson, M.D.  Primary Care at [facility] [address] [place], [place] [address] [phone] phone [phone] fax  04/11/17 7:11 PM

## 2017-04-12 LAB — COMPREHENSIVE METABOLIC PANEL
ALK PHOS: 110 IU/L (ref 39–117)
ALT: 26 IU/L (ref 0–44)
AST: 27 IU/L (ref 0–40)
Albumin/Globulin Ratio: 1.3 (ref 1.2–2.2)
Albumin: 4.2 g/dL (ref 3.6–4.8)
BILIRUBIN TOTAL: 0.7 mg/dL (ref 0.0–1.2)
BUN/Creatinine Ratio: 22 (ref 10–24)
BUN: 27 mg/dL (ref 8–27)
CHLORIDE: 103 mmol/L (ref 96–106)
CO2: 21 mmol/L (ref 20–29)
CREATININE: 1.21 mg/dL (ref 0.76–1.27)
Calcium: 9.5 mg/dL (ref 8.6–10.2)
GFR calc Af Amer: 74 mL/min/{1.73_m2} (ref >59)
GFR calc non Af Amer: 64 mL/min/{1.73_m2} (ref >59)
GLOBULIN, TOTAL: 3.2 g/dL (ref 1.5–4.5)
Glucose: 94 mg/dL (ref 65–99)
Potassium: 4.8 mmol/L (ref 3.5–5.2)
SODIUM: 138 mmol/L (ref 134–144)
Total Protein: 7.4 g/dL (ref 6.0–8.5)

## 2017-04-18 ENCOUNTER — Encounter (HOSPITAL_BASED_OUTPATIENT_CLINIC_OR_DEPARTMENT_OTHER): Admission: RE | Payer: Self-pay | Source: Ambulatory Visit

## 2017-04-18 ENCOUNTER — Ambulatory Visit (HOSPITAL_BASED_OUTPATIENT_CLINIC_OR_DEPARTMENT_OTHER): Admission: RE | Admit: 2017-04-18 | Payer: Managed Care, Other (non HMO) | Source: Ambulatory Visit | Admitting: Urology

## 2017-04-18 ENCOUNTER — Other Ambulatory Visit: Payer: Self-pay | Admitting: Family Medicine

## 2017-04-18 SURGERY — INSERTION, PENILE PROSTHESIS, INFLATABLE
Anesthesia: General

## 2017-04-19 ENCOUNTER — Ambulatory Visit: Payer: Managed Care, Other (non HMO) | Admitting: Family Medicine

## 2017-04-26 ENCOUNTER — Ambulatory Visit (INDEPENDENT_AMBULATORY_CARE_PROVIDER_SITE_OTHER): Payer: Managed Care, Other (non HMO) | Admitting: Family Medicine

## 2017-04-26 ENCOUNTER — Encounter: Payer: Self-pay | Admitting: Family Medicine

## 2017-04-26 VITALS — BP 121/70 | HR 70 | Temp 97.9°F | Resp 18 | Ht 77.0 in | Wt 212.6 lb

## 2017-04-26 DIAGNOSIS — H1033 Unspecified acute conjunctivitis, bilateral: Secondary | ICD-10-CM | POA: Diagnosis not present

## 2017-04-26 MED ORDER — OFLOXACIN 0.3 % OP SOLN: OPHTHALMIC | 0 refills | Status: DC

## 2017-04-26 NOTE — Patient Instructions (Addendum)
   IF you received an x-ray today, you will receive an invoice from [facility]. Please contact [facility] at [phone] with questions or concerns regarding your invoice.   IF you received labwork today, you will receive an invoice from [facility]. Please contact [facility] at [phone] with questions or concerns regarding your invoice.   Our billing staff will not be able to assist you with questions regarding bills from these companies.  You will be contacted with the lab results as soon as they are available. The fastest way to get your results is to activate your My Chart account. Instructions are located on the last page of this paperwork. If you have not heard from us regarding the results in 2 weeks, please contact this office.      Bacterial Conjunctivitis Bacterial conjunctivitis is an infection of the clear membrane that covers the white part of your eye and the inner surface of your eyelid (conjunctiva). When the blood vessels in your conjunctiva become inflamed, your eye becomes red or pink, and it will probably feel itchy. Bacterial conjunctivitis spreads very easily from person to person (is contagious). It also spreads easily from one eye to the other eye. What are the causes? This condition is caused by several common bacteria. You may get the infection if you come into close contact with another person who is infected. You may also come into contact with items that are contaminated with the bacteria, such as a face towel, contact lens solution, or eye makeup. What increases the risk? This condition is more likely to develop in people who:  Are exposed to other people who have the infection.  Wear contact lenses.  Have a sinus infection.  Have had a recent eye injury or surgery.  Have a weak body defense system (immune system).  Have a medical condition that causes dry eyes. What are the signs or symptoms? Symptoms of this condition  include:  Eye redness.  Tearing or watery eyes.  Itchy eyes.  Burning feeling in your eyes.  Thick, yellowish discharge from an eye. This may turn into a crust on the eyelid overnight and cause your eyelids to stick together.  Swollen eyelids.  Blurred vision. How is this diagnosed? Your health care provider can diagnose this condition based on your symptoms and medical history. Your health care provider may also take a sample of discharge from your eye to find the cause of your infection. This is rarely done. How is this treated? Treatment for this condition includes:  Antibiotic eye drops or ointment to clear the infection more quickly and prevent the spread of infection to others.  Oral antibiotic medicines to treat infections that do not respond to drops or ointments, or last longer than 10 days.  Cool, wet cloths (cool compresses) placed on the eyes.  Artificial tears applied 2-6 times a day. Follow these instructions at home: Medicines  Take or apply your antibiotic medicine as told by your health care provider. Do not stop taking or applying the antibiotic even if you start to feel better.  Take or apply over-the-counter and prescription medicines only as told by your health care provider.  Be very careful to avoid touching the edge of your eyelid with the eye drop bottle or the ointment tube when you apply medicines to the affected eye. This will keep you from spreading the infection to your other eye or to other people. Managing discomfort  Gently wipe away any drainage from your eye with a   warm, wet washcloth or a cotton ball.  Apply a cool, clean washcloth to your eye for 10-20 minutes, 3-4 times a day. General instructions  Do not wear contact lenses until the inflammation is gone and your health care provider says it is safe to wear them again. Ask your health care provider how to sterilize or replace your contact lenses before you use them again. Wear glasses  until you can resume wearing contacts.  Avoid wearing eye makeup until the inflammation is gone. Throw away any old eye cosmetics that may be contaminated.  Change or wash your pillowcase every day.  Do not share towels or washcloths. This may spread the infection.  Wash your hands often with soap and water. Use paper towels to dry your hands.  Avoid touching or rubbing your eyes.  Do not drive or use heavy machinery if your vision is blurred. Contact a health care provider if:  You have a fever.  Your symptoms do not get better after 10 days. Get help right away if:  You have a fever and your symptoms suddenly get worse.  You have severe pain when you move your eye.  You have facial pain, redness, or swelling.  You have sudden loss of vision. This information is not intended to replace advice given to you by your health care provider. Make sure you discuss any questions you have with your health care provider. Document Released: 07/24/2005 Document Revised: 12/02/2015 Document Reviewed: 05/06/2015 Elsevier Interactive Patient Education  2017 Elsevier Inc.  

## 2017-04-26 NOTE — Progress Notes (Signed)
Subjective:    Patient ID: Ronald Zhang, male    DOB: Aug 06, 1956, 61 y.o.   MRN: [id number] Chief Complaint  Patient presents with  . Conjunctivitis    x2 days, pt states eye irritation started it out has a stye but states the stye is gone and now has burning in both eye.    HPI Had a stye so used "otc stye ointment" and it went away within 24 hrs - started 2d ago - and then the redness and burning stayed and gotten slwoly better. Left started burning yeaterdayl No contacts - wears safety glasses at work, occ reading glasses.  No drops, nothing today. Very religious about wearing safety glasses but does have a lot of dust at work  Past Medical History:  Diagnosis Date  . Alcohol dependency (HCC)    stopped drinking since May 07, 2013  . Allergy   . Anemia    iron deficiency, non compliant with iron  . Arthritis    bilateral knees,secondary to trauma  . Cellulitis and abscess of leg    Hospitalized at [facility] from 4/5-4/10/14 for bilateral LE edema and cellulitis. Was on IV Lasix and Vancomycin.   . Erectile dysfunction   . H/O non anemic vitamin B12 deficiency 2014  . Hypertension   . Insomnia    3rd shift   . Non-rheumatic mitral regurgitation    Asymptomatic, Seen by cardiology 01/26/15, Dr Norman Herrlich, [facility]   . Pancreatic cyst    Multiple, MRI of abd and pelvis in 12/2013  , ? sequelae of chronic pancreatitis , he has a hx of alcoholism, suggested repeat MRI in 6 months fro progression, appears nonmalignant  . Pulmonary HTN (HCC)    TEE on March 31,2014-mild-mod concentric hypertrophy.  EF 60-65%. Left atrial valve mildly dilated, right atrium mildly dilated, mild-moderate MR, mild TR. Pum Pressure 51 mm Hg  . Restless leg   . Urinary incontinence    Past Surgical History:  Procedure Laterality Date  . FRACTURE SURGERY Bilateral 1972   below knee  . HEMORRHOID SURGERY    . 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 Loralie Champagne     Current Outpatient Prescriptions on File Prior to Visit  Medication Sig Dispense Refill  . budesonide-formoterol (SYMBICORT) 160-4.5 MCG/ACT inhaler Inhale 2 puffs into the lungs 2 (two) times daily. 1 Inhaler 3  . ferrous sulfate 325 (65 FE) MG tablet Take 1 tablet (325 mg total) by mouth daily. 90 tablet 1  . furosemide (LASIX) 40 MG tablet Take 1 tablet (40 mg total) by mouth daily. 90 tablet 1  . gabapentin (NEURONTIN) 100 MG capsule Take 1-2 capsules (100-200 mg total) by mouth 2 (two) times daily. 360 capsule 1  . rOPINIRole (REQUIP) 2 MG tablet Take 1 tablet (2 mg total) by mouth 2 (two) times daily. 180 tablet 0  . spironolactone (ALDACTONE) 100 MG tablet Take 1 tablet (100 mg total) by mouth daily. 90 tablet 1  . traMADol (ULTRAM) 50 MG tablet TK 1 T PO Q 12 H PRN  0  . traZODone (DESYREL) 50 MG tablet TAKE 1 TABLET(50 MG) BY MOUTH AT BEDTIME 90 tablet 0  . VITAMIN D, CHOLECALCIFEROL, PO Take by mouth.    . Albuterol Sulfate (PROAIR RESPICLICK) 108 (90 Base) MCG/ACT AEPB Inhale 2 puffs into the lungs every 4 (four) hours. (Patient not taking: Reported on 04/26/2017) 1 each 5  . omeprazole (PRILOSEC) 20 MG capsule TK 1 C PO QAM  0   No current facility-administered medications on file prior to visit.    Allergies  Allergen Reactions  . Penicillins Swelling    Childhood   Family History  Problem Relation Age of Onset  . Hypertension Mother   . Hypertension Father   . Alcohol abuse Brother    Social History   Social History  . Marital status: Divorced    Spouse name: n/a  . Number of children: 2  . 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 Smoking status: Never Smoker  . Smokeless tobacco: Never Used  . Alcohol use No     Comment: history of a pint a day, sober since 05/07/2013  . Drug use: No  . Sexual activity: No   Other Topics Concern  . None   Social History Narrative   05/07/2013 AHW "[name]"  was born and grew up in [place], [place]. He has 3 younger brothers. His parents are still together. He reports he had a good childhood. He graduated from high school. He has worked for [employer] for 35 years. He married once, and has been divorced for 30 years. He has twins, a boy and girl. He denies any legal difficulties. His hobbies include family genealogy, studying Southern history, and participating in Civil War reenactments. 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 His social support system consists of friends. 05/07/2013 AHW   Depression screen PHQ 2/9 04/26/2017 04/11/2017 03/14/2017 03/20/2016 01/22/2016  Decreased Interest 0 0 0 0 0  Down, Depressed, Hopeless 0 0 0 0 0  PHQ - 2 Score 0 0 0 0 0    Review of Systems  Constitutional: Negative for chills, diaphoresis, fatigue and fever.  HENT: Negative for congestion, ear discharge, ear pain, postnasal drip, rhinorrhea, sinus pressure, sneezing and sore throat.   Eyes: Positive for pain, redness and itching. Negative for photophobia, discharge and visual disturbance.  Skin: Negative for color change and rash.  Neurological: Negative for headaches.  Hematological: Negative for adenopathy.  Psychiatric/Behavioral: Negative for sleep disturbance.       Objective:   Physical Exam  Constitutional: He is oriented to person, place, and time. He appears well-developed and well-nourished. No distress.  HENT:  Head: Normocephalic and atraumatic.  Eyes: Pupils are equal, round, and reactive to light. EOM are normal. Right eye exhibits no chemosis, no discharge, no exudate and no hordeolum. No foreign body present in the right eye. Left eye exhibits no chemosis, no discharge, no exudate and no hordeolum. No foreign body present in the left eye. Right conjunctiva is injected. Right conjunctiva has no hemorrhage. Left conjunctiva is injected. Left conjunctiva has no hemorrhage. No scleral icterus. Right eye exhibits normal extraocular motion  and no nystagmus. Left eye exhibits normal extraocular motion and no nystagmus. Right pupil is round and reactive. Left pupil is round and reactive. Pupils are equal.  Conjunctiva on inside of lids w/ > erythema worset at Rt lower with slight inflammaitno of Rt lower lid.  Pulmonary/Chest: Effort normal.  Neurological: He is alert and oriented to person, place, and time.  Skin: Skin is warm and dry. He is not diaphoretic.  Psychiatric: He has a normal mood and affect. His behavior is normal.         BP 121/70 (BP Location: Right Arm, Patient Position: Sitting, Cuff Size: Normal)   Pulse 70   Temp 97.9 F (36.6 C) (Oral)   Resp  18   Ht  (1.956 m)   Wt 212 lb 9.6 oz (96.4 kg)   SpO2 94%   BMI 25.21 kg/m    Visual Acuity Screening   Right eye Left eye Both eyes  Without correction:  With correction:       Assessment & Plan:   1. Acute bacterial conjunctivitis of both eyes     Meds ordered this encounter  Medications  . ofloxacin (OCUFLOX) 0.3 % ophthalmic solution    Sig: 2 gtt in eye ever 2 hours for 2 days, then four times a day for 5 days    Dispense:  10 mL    Refill:  0    Norberto Sorenson, M.D.  Primary Care at [facility] [address] [place], [place] [address] [phone] phone [phone] fax  04/29/17 4:29 PM

## 2017-05-01 ENCOUNTER — Other Ambulatory Visit: Payer: Self-pay

## 2017-05-01 ENCOUNTER — Ambulatory Visit (HOSPITAL_COMMUNITY): Payer: Managed Care, Other (non HMO) | Attending: Family Medicine

## 2017-05-01 DIAGNOSIS — F1011 Alcohol abuse, in remission: Secondary | ICD-10-CM | POA: Diagnosis not present

## 2017-05-01 DIAGNOSIS — R0602 Shortness of breath: Secondary | ICD-10-CM | POA: Insufficient documentation

## 2017-05-01 DIAGNOSIS — I517 Cardiomegaly: Secondary | ICD-10-CM | POA: Diagnosis present

## 2017-05-01 DIAGNOSIS — I10 Essential (primary) hypertension: Secondary | ICD-10-CM | POA: Insufficient documentation

## 2017-05-01 DIAGNOSIS — I272 Pulmonary hypertension, unspecified: Secondary | ICD-10-CM | POA: Diagnosis present

## 2017-05-07 ENCOUNTER — Telehealth: Payer: Self-pay | Admitting: Family Medicine

## 2017-05-07 NOTE — Telephone Encounter (Signed)
Pt called stated that he is very unhappy because his insurance is not going to cover his ct and [place] procedure that he was sch for 10/2 and he just found out the day before that they wouldn't cover told pt that I will get referrals to look into it for him.. Pt wanted dr Clelia Croft to know that he couldn't cover the procedures out of pocket and that's why he canceled them.

## 2017-05-08 ENCOUNTER — Other Ambulatory Visit: Payer: Self-pay

## 2017-05-08 NOTE — Telephone Encounter (Signed)
I initiated the prior auth through [facility] today to see if they would cover pt's CT so we could get him back on [facility]'s schedule. I am not sure who told the pt this would not be covered because prior auth had not been started. I spoke with Victorino Dike from [facility] who called me about this and she said from the looks of the clinical information, prior auth would not be approved. Rosann Auerbach did not approve this and said they did not recommend the procedure for this pt, however, I added the clinical info about the size of the nodule in the notes to see if after they review this it may get authorized. Auth is pending right now. I tried calling the pt to let him know the status but was unable to leave a vm due to mailbox being full.

## 2017-05-11 ENCOUNTER — Other Ambulatory Visit: Payer: Self-pay | Admitting: Family Medicine

## 2017-05-21 ENCOUNTER — Ambulatory Visit
Admission: RE | Admit: 2017-05-21 | Discharge: 2017-05-21 | Disposition: A | Discharge status: Home / Self Care | Payer: Managed Care, Other (non HMO) | Source: Ambulatory Visit | Attending: Family Medicine | Admitting: Family Medicine

## 2017-05-21 DIAGNOSIS — R911 Solitary pulmonary nodule: Secondary | ICD-10-CM

## 2017-05-21 DIAGNOSIS — K7460 Unspecified cirrhosis of liver: Secondary | ICD-10-CM

## 2017-05-21 MED ORDER — IOPAMIDOL (ISOVUE-300) INJECTION 61%
75.0000 mL | Freq: Once | INTRAVENOUS | Status: AC | PRN
  Administered 2017-05-21: 75 mL via INTRAVENOUS

## 2017-05-28 NOTE — Telephone Encounter (Signed)
Pt has had these done.

## 2017-05-30 NOTE — Addendum Note (Signed)
Addended by: Sherren MochaSHAW, Bron Snellings N on: 05/30/2017 12:56 AM   Modules accepted: Orders

## 2017-06-05 ENCOUNTER — Telehealth: Payer: Self-pay | Admitting: Family Medicine

## 2017-06-05 NOTE — Telephone Encounter (Signed)
Dr Clelia CroftShaw pt would like for you to get in touch with Dr Jennye [facility] to see what you want Dr to do with him

## 2017-06-06 ENCOUNTER — Encounter: Payer: Self-pay | Admitting: Family Medicine

## 2017-06-06 ENCOUNTER — Ambulatory Visit (INDEPENDENT_AMBULATORY_CARE_PROVIDER_SITE_OTHER): Payer: Managed Care, Other (non HMO) | Admitting: Family Medicine

## 2017-06-06 VITALS — BP 118/62 | HR 81 | Temp 98.3°F | Resp 16 | Ht 77.17 in | Wt 221.0 lb

## 2017-06-06 DIAGNOSIS — K7460 Unspecified cirrhosis of liver: Secondary | ICD-10-CM | POA: Diagnosis not present

## 2017-06-06 DIAGNOSIS — K862 Cyst of pancreas: Secondary | ICD-10-CM | POA: Diagnosis not present

## 2017-06-06 DIAGNOSIS — Z23 Encounter for immunization: Secondary | ICD-10-CM | POA: Diagnosis not present

## 2017-06-06 DIAGNOSIS — G2581 Restless legs syndrome: Secondary | ICD-10-CM

## 2017-06-06 DIAGNOSIS — E611 Iron deficiency: Secondary | ICD-10-CM

## 2017-06-06 DIAGNOSIS — D696 Thrombocytopenia, unspecified: Secondary | ICD-10-CM | POA: Diagnosis not present

## 2017-06-06 LAB — COMPREHENSIVE METABOLIC PANEL
A/G RATIO: 1.5 (ref 1.2–2.2)
ALBUMIN: 4.3 g/dL (ref 3.6–4.8)
ALT: 26 IU/L (ref 0–44)
AST: 25 IU/L (ref 0–40)
Alkaline Phosphatase: 100 IU/L (ref 39–117)
BUN / CREAT RATIO: 18 (ref 10–24)
BUN: 22 mg/dL (ref 8–27)
Bilirubin Total: 1 mg/dL (ref 0.0–1.2)
CALCIUM: 9.5 mg/dL (ref 8.6–10.2)
CO2: 25 mmol/L (ref 20–29)
CREATININE: 1.22 mg/dL (ref 0.76–1.27)
Chloride: 101 mmol/L (ref 96–106)
GFR calc Af Amer: 73 mL/min/{1.73_m2} (ref >59)
GFR, EST NON AFRICAN AMERICAN: 63 mL/min/{1.73_m2} (ref >59)
GLOBULIN, TOTAL: 2.9 g/dL (ref 1.5–4.5)
Glucose: 96 mg/dL (ref 65–99)
Potassium: 4.9 mmol/L (ref 3.5–5.2)
SODIUM: 139 mmol/L (ref 134–144)
Total Protein: 7.2 g/dL (ref 6.0–8.5)

## 2017-06-06 LAB — POCT CBC
GRANULOCYTE PERCENT: 76.4 % (ref 37–80)
HEMATOCRIT: 51.6 % (ref 43.5–53.7)
HEMOGLOBIN: 17 g/dL (ref 14.1–18.1)
Lymph, poc: 0.9 (ref 0.6–3.4)
MCH: 29 pg (ref 27–31.2)
MCHC: 33 g/dL (ref 31.8–35.4)
MCV: 88.1 fL (ref 80–97)
MID (cbc): 0.2 (ref 0–0.9)
MPV: 9.3 fL (ref 0–99.8)
PLATELET COUNT, POC: 63 10*3/uL — AB (ref 142–424)
POC GRANULOCYTE: 3.7 (ref 2–6.9)
POC LYMPH PERCENT: 18.6 %L (ref 10–50)
POC MID %: 5 %M (ref 0–12)
RBC: 5.85 M/uL (ref 4.69–6.13)
RDW, POC: 19.2 %
WBC: 4.8 10*3/uL (ref 4.6–10.2)

## 2017-06-06 LAB — FERRITIN: Ferritin: 58 ng/mL (ref 30–400)

## 2017-06-06 LAB — PROTIME-INR
INR: 1.2 (ref 0.8–1.2)
PROTHROMBIN TIME: 12.4 s — AB (ref 9.1–12.0)

## 2017-06-06 LAB — APTT: aPTT: 28 s (ref 24–33)

## 2017-06-06 NOTE — Telephone Encounter (Signed)
See below

## 2017-06-06 NOTE — Progress Notes (Signed)
Subjective:  By signing my name below, I, Ronald Zhang, attest that this documentation has been prepared under the direction and in the presence of Ronald Zhang. Electronically Signed: Stann Zhang, Scribe. 06/06/2017 , 2:36 PM .  Patient was seen in Room 3 .   Patient ID: Ronald Zhang, male    DOB: [birth year], 61 y.o.   MRN: [id number] Chief Complaint  Patient presents with  . Go over Results    CT and US done on 10/15   HPI Ronald Zhang is a 61 y.o. male who presents to Primary Care at [facility] to go over CT and [place] results done on Oct 15th. Patient saw [facility] at [facility] 2 years ago. He was noted to have cirrhosis at that time, as well as a cyst of the pancreas; plan for repeat MRI before his follow up visit. He was instructed to stay on low salt diet for his cirrhosis. However, patient developed other orthopedic medical problems and was unable to follow up.   When I saw patient almost 3 months ago, he had developed leukopenia and thrombocytopenia with small increase in his bilirubin and his INR. Patient was reporting some shortness of breath, as well as pedal edema, so started on spironolactone and lasix combo. His symptoms significantly improved with the diuretics which he tolerated well and his metabolic panel normalized, and his CBC remains stable. Patient did still have an iron deficiency.   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 However, he did not have the prior imaging available to know ho wit had progressed in the past 2 years. Since patient needs comparison of his spleen prior and varices, he was referred back to his GI.   Patient reports having an appointment with his GI, [facility] Zhang, tomorrow  morning at 8:00AM.   Past Medical History:  Diagnosis Date  . Alcohol dependency (HCC)    stopped drinking since May 07, 2013  . Allergy   . Anemia    iron deficiency, non compliant with iron  . Arthritis    bilateral knees,secondary to trauma  . Cellulitis and abscess of leg    Hospitalized at [facility] from 4/5-4/10/14 for bilateral LE edema and cellulitis. Was on IV Lasix and Vancomycin.   . Erectile dysfunction   . H/O non anemic vitamin B12 deficiency 2014  . Hypertension   . Insomnia    3rd shift   . Non-rheumatic mitral regurgitation    Asymptomatic, Seen by Zhang 01/26/15, [facility], [facility]   . Pancreatic cyst    Multiple, MRI of abd and pelvis in 12/2013  , ? sequelae of chronic pancreatitis , he has a hx of alcoholism, suggested repeat MRI in 6 months fro progression, appears nonmalignant  . Pulmonary HTN (HCC)    TEE on March 31,2014-mild-mod concentric hypertrophy.  EF 60-65%. Left atrial valve mildly dilated, right atrium mildly dilated, mild-moderate MR, mild TR. Pum Pressure 51 mm Hg  . Restless leg   . Urinary incontinence    Prior to Admission medications   Medication Sig Start Date End Date Taking? Authorizing Provider  budesonide-formoterol (SYMBICORT) 160-4.5 MCG/ACT inhaler Inhale 2 puffs into the lungs 2 (two) times daily. 04/11/17  Yes Ronald Mocha, Zhang  ferrous sulfate 325 (65 FE)  MG tablet Take 1 tablet (325 mg total) by mouth daily. 04/11/17  Yes Ronald Mocha, Zhang  furosemide (LASIX) 40 MG tablet Take 1 tablet (40 mg total) by mouth daily. 04/11/17  Yes Ronald Mocha, Zhang  gabapentin (NEURONTIN) 100 MG capsule Take 1-2 capsules (100-200 mg total) by mouth 2 (two) times daily. 03/14/17  Yes Ronald Mocha, Zhang  ofloxacin (OCUFLOX) 0.3 % ophthalmic solution 2 gtt in eye ever 2 hours for 2 days, then four times a day for 5 days 04/26/17  Yes Ronald Mocha, Zhang  omeprazole (PRILOSEC) 20 MG capsule TK 1 C PO QAM 01/07/16  Yes Provider, Historical,  Zhang  rOPINIRole (REQUIP) 2 MG tablet Take 1 tablet (2 mg total) by mouth 2 (two) times daily. 04/11/17  Yes Ronald Mocha, Zhang  spironolactone (ALDACTONE) 100 MG tablet Take 1 tablet (100 mg total) by mouth daily. 04/11/17  Yes Ronald Mocha, Zhang  traMADol (ULTRAM) 50 MG tablet TK 1 T PO Q 12 H PRN 11/04/15  Yes Provider, Historical, Zhang  traZODone (DESYREL) 50 MG tablet TAKE 1 TABLET(50 MG) BY MOUTH AT BEDTIME 03/14/17  Yes Ronald Mocha, Zhang  VITAMIN D, CHOLECALCIFEROL, PO Take by mouth.   Yes Provider, Historical, Zhang   Allergies  Allergen Reactions  . Penicillins Swelling    Childhood   Past Surgical History:  Procedure Laterality Date  . FRACTURE SURGERY Bilateral 1972   below knee  . HEMORRHOID SURGERY    . REPLACEMENT TOTAL KNEE Left 09/13/2015   Ronald. Loralie Zhang   Family History  Problem Relation Age of Onset  . Hypertension Mother   . Hypertension Father   . Alcohol abuse Brother    Social History   Social History  . Marital status: Divorced    Spouse name: n/a  . Number of children: 2  . 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 Smoking status: Never Smoker  . Smokeless tobacco: Never Used  . Alcohol use No     Comment: history of a pint a day, sober since 05/07/2013  . Drug use: No  . Sexual activity: No   Other Topics Concern  . None   Social History Narrative   05/07/2013 Ronald "[name]" was born and grew up in [place], [place]. He has 3 younger brothers. His parents are still together. He reports he had a good childhood. He graduated from high school. He has worked for [employer] for 35 years. He married once, and has been divorced for 30 years. He has twins, a boy and girl. He denies any legal difficulties. His hobbies include family genealogy, studying Southern history, and participating in Civil War reenactments. 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 His social support system consists of friends.  05/07/2013 Ronald   Depression screen PHQ 2/9 06/06/2017 04/26/2017 04/11/2017 03/14/2017 03/20/2016  Decreased Interest 0 0 0 0 0  Down, Depressed, Hopeless 0 0 0 0 0  PHQ - 2 Score 0 0 0 0 0    Review of Systems  Constitutional: Negative for fatigue and unexpected weight change.  Eyes: Negative for visual disturbance.  Respiratory: Negative for cough, chest tightness and shortness of breath.   Cardiovascular: Negative for chest pain, palpitations and leg swelling.  Gastrointestinal: Negative for abdominal pain and blood in stool.  Neurological: Negative for dizziness, light-headedness and headaches.       Objective:   Physical  Exam  Constitutional: He is oriented to person, place, and time. He appears well-developed and well-nourished. No distress.  HENT:  Head: Normocephalic and atraumatic.  Eyes: Pupils are equal, round, and reactive to light. EOM are normal.  Neck: Neck supple.  Cardiovascular: Normal rate.   Pulmonary/Chest: Effort normal. No respiratory distress.  Musculoskeletal: Normal range of motion.  Neurological: He is alert and oriented to person, place, and time.  Skin: Skin is warm and dry.  Psychiatric: He has a normal mood and affect. His behavior is normal.  Nursing note and vitals reviewed.   BP 118/62   Pulse 81   Temp 98.3 F (36.8 C) (Oral)   Resp 16   Ht 6' 5.17" (1.96 m)   Wt 221 lb (100.2 kg)   SpO2 95%   BMI 26.09 kg/m      Assessment & Plan:   1. Cirrhosis of liver without ascites, unspecified hepatic cirrhosis type (HCC)   2. Pancreatic cyst   3. Restless leg - still uncontrolled - only slightly improved. Hoping it will improve w/ iron supp. If it does not, try switching meds from requip -> mirapex  4. Thrombocytopenia (HCC)   5. Iron deficiency    Has appt w/ Ronald. Jennye BoroughsMisenheimer tomorrow a.m.  Will fax over labs.  Orders Placed This Encounter  Procedures  . Tdap vaccine greater than or equal to 7yo IM  . Comprehensive metabolic panel  .  Protime-INR  . APTT  . Ferritin  . Comprehensive metabolic panel  . Ferritin  . POCT CBC    I personally performed the services described in this documentation, which was scribed in my presence. The recorded information has been reviewed and considered, and addended by me as needed.   Ronald SorensonEva Vicki Pasqual, M.D.  [facility] at [facility]  [facility] [address] [place], [place] [address] [phone] phone [phone] fax  06/08/17 1:19 AM

## 2017-06-06 NOTE — Patient Instructions (Addendum)
IF you received an x-ray today, you will receive an invoice from [facility]. Please contact [facility] at [phone] with questions or concerns regarding your invoice.   IF you received labwork today, you will receive an invoice from [facility]. Please contact [facility] at [phone] with questions or concerns regarding your invoice.   Our billing staff will not be able to assist you with questions regarding bills from these companies.  You will be contacted with the lab results as soon as they are available. The fastest way to get your results is to activate your My Chart account. Instructions are located on the last page of this paperwork. If you have not heard from us regarding the results in 2 weeks, please contact this office.     Esophageal Varices The esophagus is the passage that connects the throat to the stomach. Esophageal varices are blood vessels in the esophagus that have become enlarged. They develop when extra blood is forced to flow through them because the blood's normal pathway is blocked. Without treatment these blood vessels eventually break and bleed (hemorrhage). A hemorrhage is life-threatening. What are the causes? This condition may be caused by:  Scarring of the liver (cirrhosis) due to alcoholism. This is the most common cause.  Liver disease.  Severe heart failure.  A blood clot in the portal vein.  Sarcoidosis. This is an inflammatory disease that can affect the liver.  Schistosomiasis. This is a parasitic infection that can cause liver damage.  What are the signs or symptoms? Usually there are no symptoms unless the esophageal varices bleed. Symptoms of bleeding esophageal varices include:  Vomiting material that is bright red or that is black and looks like coffee grounds.  Coughing up blood.  Black, tarry stools.  Dizziness or lightheadedness.  Low blood pressure.  Loss of consciousness.  How is this  diagnosed? This condition is diagnosed with tests, such as:  Endoscopy. During this test a thin, lighted tube is inserted through the mouth and into the esophagus.  Blood tests. These may be done to check liver function, blood counts, and the body's ability to form blood clots.  How is this treated? This condition may be treated with:  Medicines that reduce pressure in the esophageal varices and reduce the risk of bleeding.  Procedures to reduce pressure in the esophageal varices and reduce the risk of bleeding or stop bleeding. These include: ? Variceal ligation. In this procedure, a rubber band is placed around the esophageal varices to keep them from bleeding. ? Injection therapy. This treatment involves an injection that causes the esophageal varices to shrink and close (sclerotherapy). Medicines that tighten blood vessels or alter blood flow may also be used. ? Balloon tamponade. In this procedure, a tube is put into the esophagus and a balloon is passed through it and inflated. ? Transjugular intrahepatic portosystemic shunt (TIPS) placement. In this procedure, a small tube is placed within the liver veins. This decreases blood flow and pressure to the esophageal varices.  A liver transplant. This may be done if other treatments do not work.  Follow these instructions at home:  Take medicines only as directed by your health care provider.  Follow your health care provider's instructions about rest and physical activity. Get help right away if:  You have any symptoms of this condition after treatment.  You are unable to eat or drink.  You have chest pain. This information is not intended to replace advice given to you by  your health care provider. Make sure you discuss any questions you have with your health care provider. Document Released: 10/14/2003 Document Revised: 12/30/2015 Document Reviewed: 07/20/2014 Elsevier Interactive Patient Education  2018 [employer].   Cirrhosis Cirrhosis is long-term (chronic) liver injury. The liver is your largest internal organ, and it performs many functions. The liver converts food into energy, removes toxic material from your blood, makes important proteins, and absorbs necessary vitamins from your diet. If you have cirrhosis, it means many of your healthy liver cells have been replaced by scar tissue. This prevents blood from flowing through your liver, which makes it difficult for your liver to function. This scarring is not reversible, but treatment can prevent it from getting worse. What are the causes? Hepatitis C and long-term alcohol abuse are the most common causes of cirrhosis. Other causes include:  Nonalcoholic fatty liver disease.  Hepatitis B infection.  Autoimmune hepatitis.  Diseases that cause blockage of ducts inside the liver.  Inherited liver diseases.  Reactions to certain long-term medicines.  Parasitic infections.  Long-term exposure to certain toxins.  What increases the risk? You may have a higher risk of cirrhosis if you:  Have certain hepatitis viruses.  Abuse alcohol, especially if you are male.  Are overweight.  Share needles.  Have unprotected sex with someone who has hepatitis.  What are the signs or symptoms? You may not have any signs and symptoms at first. Symptoms may not develop until the damage to your liver starts to get worse. Signs and symptoms of cirrhosis may include:  Tenderness in the right-upper part of your abdomen.  Weakness and tiredness (fatigue).  Loss of appetite.  Nausea.  Weight loss and muscle loss.  Itchiness.  Yellow skin and eyes (jaundice).  Buildup of fluid in the abdomen (ascites).  Swelling of the feet and ankles (edema).  Appearance of tiny blood vessels under the skin.  Mental confusion.  Easy bruising and bleeding.  How is this diagnosed? Your health care provider may suspect cirrhosis based on your  symptoms and medical history, especially if you have other medical conditions or a history of alcohol abuse. Your health care provider will do a physical exam to feel your liver and check for signs of cirrhosis. Your health care provider may perform other tests, including:  Blood tests to check: ? Whether you have hepatitis B or C. ? Kidney function. ? Liver function.  Imaging tests such as: ? MRI or CT scan to look for changes seen in advanced cirrhosis. ? Ultrasound to see if normal liver tissue is being replaced by scar tissue.  A procedure using a long needle to take a sample of liver tissue (biopsy) for examination under a microscope. Liver biopsy can confirm the diagnosis of cirrhosis.  How is this treated? Treatment depends on how damaged your liver is and what caused the damage. Treatment may include treating cirrhosis symptoms or treating the underlying causes of the condition to try to slow the progression of the damage. Treatment may include:  Making lifestyle changes, such as: ? Eating a healthy diet. ? Restricting salt intake. ? Maintaining a healthy weight. ? Not abusing drugs or alcohol.  Taking medicines to: ? Treat liver infections or other infections. ? Control itching. ? Reduce fluid buildup. ? Reduce certain blood toxins. ? Reduce risk of bleeding from enlarged blood vessels in the stomach or esophagus (varices).  If varices are causing bleeding problems, you may need treatment with a procedure that ties up  the vessels causing them to fall off (band ligation).  If cirrhosis is causing your liver to fail, your health care provider may recommend a liver transplant.  Other treatments may be recommended depending on any complications of cirrhosis, such as liver-related kidney failure (hepatorenal syndrome).  Follow these instructions at home:  Take medicines only as directed by your health care provider. Do not use drugs that are toxic to your liver. Ask your  health care provider before taking any new medicines, including over-the-counter medicines.  Rest as needed.  Eat a well-balanced diet. Ask your health care provider or dietitian for more information.  You may have to follow a low-salt diet or restrict your water intake as directed.  Do not drink alcohol. This is especially important if you are taking acetaminophen.  Keep all follow-up visits as directed by your health care provider. This is important. Contact a health care provider if:  You have fatigue or weakness that is getting worse.  You develop swelling of the hands, feet, legs, or face.  You have a fever.  You develop loss of appetite.  You have nausea or vomiting.  You develop jaundice.  You develop easy bruising or bleeding. Get help right away if:  You vomit bright red blood or a material that looks like coffee grounds.  You have blood in your stools.  Your stools appear black and tarry.  You become confused.  You have chest pain or trouble breathing. This information is not intended to replace advice given to you by your health care provider. Make sure you discuss any questions you have with your health care provider. Document Released: 07/24/2005 Document Revised: 12/02/2015 Document Reviewed: 04/01/2014 Elsevier Interactive Patient Education  [employer].

## 2017-06-07 LAB — FERRITIN: FERRITIN: 59 ng/mL (ref 30–400)

## 2017-06-07 NOTE — Telephone Encounter (Signed)
Pt seen in office yesterday and has appt with Dr. Jennye BoroughsMisenheimer early this morning. All info was sent over.

## 2017-06-09 ENCOUNTER — Other Ambulatory Visit: Payer: Self-pay | Admitting: Family Medicine

## 2017-06-18 ENCOUNTER — Other Ambulatory Visit: Payer: Self-pay | Admitting: Family Medicine

## 2017-06-18 NOTE — Telephone Encounter (Signed)
Request for controlled substance 

## 2017-06-18 NOTE — Telephone Encounter (Signed)
Copied from CRM [id number]. Topic: Quick Communication - See Telephone Encounter >> Jun 18, 2017  9:21 AM Rudi CocoLathan, Latonya Knight M, NT wrote: CRM for notification. See Telephone encounter for:   06/18/17. Pt. Called to let dr. Clelia CroftShaw know that he only has 1 trazodone (50mg ) tablet left and he needs a refill sent to [facility]  please call him when sent [phone]

## 2017-07-18 ENCOUNTER — Other Ambulatory Visit: Payer: Self-pay | Admitting: Family Medicine

## 2017-10-08 ENCOUNTER — Other Ambulatory Visit: Payer: Self-pay | Admitting: Family Medicine

## 2017-11-05 ENCOUNTER — Other Ambulatory Visit: Payer: Self-pay | Admitting: Family Medicine

## 2017-11-06 ENCOUNTER — Other Ambulatory Visit: Payer: Self-pay | Admitting: Family Medicine

## 2017-11-06 NOTE — Telephone Encounter (Signed)
LOV  06/06/17 Dr. Clelia CroftShaw

## 2017-11-07 NOTE — Telephone Encounter (Signed)
Needs office visit within the next 2 mos - needs to have FASTING lab work done at [facility] before we can send in any additional refills - need.to ensure medications aren't harming his kidneys or liver

## 2017-11-21 DIAGNOSIS — K7460 Unspecified cirrhosis of liver: Secondary | ICD-10-CM | POA: Diagnosis not present

## 2017-11-21 DIAGNOSIS — D61818 Other pancytopenia: Secondary | ICD-10-CM | POA: Diagnosis not present

## 2017-11-21 DIAGNOSIS — R161 Splenomegaly, not elsewhere classified: Secondary | ICD-10-CM | POA: Diagnosis not present

## 2017-12-04 ENCOUNTER — Other Ambulatory Visit: Payer: Self-pay | Admitting: Family Medicine

## 2017-12-04 NOTE — Telephone Encounter (Signed)
Furosemide refill Last [facility]: 06/06/17 Last Refill:11/06/17 #30 tab No RF Pharmacy:[facility] [address] Norberto Sorenson  Spironolactone refill Last [facility]: 06/06/17 Last Refill:11/06/17 #30 no RF   Routing back to provider:   Sherren Mocha, MD     3:44 AM  Note    Needs office visit within the next 2 mos - needs to have FASTING lab work done at [facility] before we can send in any additional refills - need.to ensure medications aren't harming his kidneys or liver

## 2017-12-05 ENCOUNTER — Other Ambulatory Visit: Payer: Self-pay | Admitting: Family Medicine

## 2017-12-05 NOTE — Telephone Encounter (Signed)
Patient called back to schedule appt, no appts avail until 12/25/17, requesting to be seen before then. Please advise

## 2017-12-05 NOTE — Telephone Encounter (Signed)
Patient called, left detailed VM to return the call to the office to schedule an appointment and fasting labs as indicated in the last note on 11/05/17 by Dr. Clelia Croft.

## 2017-12-08 ENCOUNTER — Other Ambulatory Visit: Payer: Self-pay | Admitting: Family Medicine

## 2017-12-10 NOTE — Telephone Encounter (Signed)
Symbicort refill Last [facility]: 04/11/17 Last Refill:04/11/17 1 inhaler 3 RF Pharmacy:[facility] [address] PCP: Dr Norberto Sorenson

## 2017-12-24 ENCOUNTER — Other Ambulatory Visit: Payer: Self-pay

## 2017-12-24 ENCOUNTER — Ambulatory Visit (INDEPENDENT_AMBULATORY_CARE_PROVIDER_SITE_OTHER): Payer: Managed Care, Other (non HMO) | Admitting: Family Medicine

## 2017-12-24 ENCOUNTER — Encounter: Payer: Self-pay | Admitting: Family Medicine

## 2017-12-24 VITALS — BP 118/68 | HR 65 | Temp 98.3°F | Resp 18 | Ht 77.0 in | Wt 209.0 lb

## 2017-12-24 DIAGNOSIS — Z125 Encounter for screening for malignant neoplasm of prostate: Secondary | ICD-10-CM | POA: Diagnosis not present

## 2017-12-24 DIAGNOSIS — D696 Thrombocytopenia, unspecified: Secondary | ICD-10-CM | POA: Diagnosis not present

## 2017-12-24 DIAGNOSIS — J4531 Mild persistent asthma with (acute) exacerbation: Secondary | ICD-10-CM

## 2017-12-24 DIAGNOSIS — K7460 Unspecified cirrhosis of liver: Secondary | ICD-10-CM

## 2017-12-24 DIAGNOSIS — F5101 Primary insomnia: Secondary | ICD-10-CM | POA: Diagnosis not present

## 2017-12-24 DIAGNOSIS — E611 Iron deficiency: Secondary | ICD-10-CM

## 2017-12-24 DIAGNOSIS — I10 Essential (primary) hypertension: Secondary | ICD-10-CM | POA: Diagnosis not present

## 2017-12-24 LAB — POCT CBC
Granulocyte percent: 79.1 %G (ref 37–80)
HEMATOCRIT: 52.7 % (ref 43.5–53.7)
HEMOGLOBIN: 17.1 g/dL (ref 14.1–18.1)
Lymph, poc: 0.8 (ref 0.6–3.4)
MCH: 30.4 pg (ref 27–31.2)
MCHC: 32.5 g/dL (ref 31.8–35.4)
MCV: 93.6 fL (ref 80–97)
MID (CBC): 0.1 (ref 0–0.9)
MPV: 8.9 fL (ref 0–99.8)
POC GRANULOCYTE: 3.5 (ref 2–6.9)
POC LYMPH PERCENT: 17.6 %L (ref 10–50)
POC MID %: 3.3 % (ref 0–12)
Platelet Count, POC: 57 10*3/uL — AB (ref 142–424)
RBC: 5.63 M/uL (ref 4.69–6.13)
RDW, POC: 13.3 %
WBC: 4.4 10*3/uL — AB (ref 4.6–10.2)

## 2017-12-24 MED ORDER — METHYLPREDNISOLONE ACETATE 80 MG/ML IJ SUSP
120.0000 mg | Freq: Once | INTRAMUSCULAR | Status: AC
  Administered 2017-12-24: 120 mg via INTRAMUSCULAR

## 2017-12-24 MED ORDER — TRIAMCINOLONE ACETONIDE 55 MCG/ACT NA AERO: 2.0000 | INHALATION_SPRAY | Freq: Every day | NASAL | 12 refills | Status: DC

## 2017-12-24 MED ORDER — BUDESONIDE-FORMOTEROL FUMARATE 160-4.5 MCG/ACT IN AERO: 2.0000 | INHALATION_SPRAY | Freq: Two times a day (BID) | RESPIRATORY_TRACT | 5 refills | Status: DC

## 2017-12-24 MED ORDER — BENZONATATE 200 MG PO CAPS: 200.0000 mg | ORAL_CAPSULE | Freq: Three times a day (TID) | ORAL | 0 refills | Status: DC | PRN

## 2017-12-24 MED ORDER — ROPINIROLE HCL 2 MG PO TABS: 4.0000 mg | ORAL_TABLET | Freq: Every day | ORAL | 0 refills | Status: DC

## 2017-12-24 MED ORDER — FUROSEMIDE 40 MG PO TABS: ORAL_TABLET | ORAL | 1 refills | Status: DC

## 2017-12-24 MED ORDER — SPIRONOLACTONE 100 MG PO TABS
ORAL_TABLET | ORAL | 1 refills | Status: DC
Start: 2017-12-24 — End: 2018-02-04

## 2017-12-24 MED ORDER — GABAPENTIN 300 MG PO CAPS: 300.0000 mg | ORAL_CAPSULE | Freq: Two times a day (BID) | ORAL | 1 refills | Status: DC

## 2017-12-24 NOTE — Patient Instructions (Addendum)
Please sign a release so we can get records from Dr. Melvyn Neth at [facility] for continuity of care. I will ask that your labs/notes be faxed to him as well just as FYI.    IF you received an x-ray today, you will receive an invoice from [facility]. Please contact [facility] at [phone] with questions or concerns regarding your invoice.   IF you received labwork today, you will receive an invoice from [place]. Please contact [facility] at [phone] with questions or concerns regarding your invoice.   Our billing staff will not be able to assist you with questions regarding bills from these companies.  You will be contacted with the lab results as soon as they are available. The fastest way to get your results is to activate your My Chart account. Instructions are located on the last page of this paperwork. If you have not heard from [place] regarding the results in 2 weeks, please contact this office.    Restless Legs Syndrome Restless legs syndrome is a condition that causes uncomfortable feelings or sensations in the legs, especially while sitting or lying down. The sensations usually cause an overwhelming urge to move the legs. The arms can also sometimes be affected. The condition can range from mild to severe. The symptoms often interfere with a person's ability to sleep. What are the causes? The cause of this condition is not known. What increases the risk? This condition is more likely to develop in:  People who are older than age 21.  Pregnant women. In general, restless legs syndrome is more common in women than in men.  People who have a family history of the condition.  People who have certain medical conditions, such as iron deficiency, kidney disease, Parkinson disease, or nerve damage.  People who take certain medicines, such as medicines for high blood pressure, nausea, colds, allergies, depression, and some heart  conditions.  What are the signs or symptoms? The main symptom of this condition is uncomfortable sensations in the legs. These sensations may be:  Described as pulling, tingling, prickling, throbbing, crawling, or burning.  Worse while you are sitting or lying down.  Worse during periods of rest or inactivity.  Worse at night, often interfering with your sleep.  Accompanied by a very strong urge to move your legs.  Temporarily relieved by movement of your legs.  The sensations usually affect both sides of the body. The arms can also be affected, but this is rare. People who have this condition often have tiredness during the day because of their lack of sleep at night. How is this diagnosed? This condition may be diagnosed based on your description of the symptoms. You may also have tests, including blood tests, to check for other conditions that may lead to your symptoms. In some cases, you may be asked to spend some time in a sleep lab so your sleeping can be monitored. How is this treated? Treatment for this condition is focused on managing the symptoms. Treatment may include:  Self-help and lifestyle changes.  Medicines.  Follow these instructions at home:  Take medicines only as directed by your health care provider.  Try these methods to get temporary relief from the uncomfortable sensations: ? Massage your legs. ? Walk or stretch. ? Take a cold or hot bath.  Practice good sleep habits. For example, go to bed and get up at the same time every day.  Exercise regularly.  Practice ways of relaxing, such as yoga or meditation.  Avoid caffeine and alcohol.  Do not use any tobacco products, including cigarettes, chewing tobacco, or electronic cigarettes. If you need help quitting, ask your health care provider.  Keep all follow-up visits as directed by your health care provider. This is important. Contact a health care provider if: Your symptoms do not improve with  treatment, or they get worse. This information is not intended to replace advice given to you by your health care provider. Make sure you discuss any questions you have with your health care provider. Document Released: 07/14/2002 Document Revised: 12/30/2015 Document Reviewed: 07/20/2014 Elsevier Interactive Patient Education  [employer].
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
## 2017-12-25 ENCOUNTER — Telehealth: Payer: Self-pay | Admitting: Family Medicine

## 2017-12-25 LAB — COMPREHENSIVE METABOLIC PANEL
A/G RATIO: 1.6 (ref 1.2–2.2)
ALK PHOS: 96 IU/L (ref 39–117)
ALT: 17 IU/L (ref 0–44)
AST: 20 IU/L (ref 0–40)
Albumin: 4.2 g/dL (ref 3.6–4.8)
BUN/Creatinine Ratio: 18 (ref 10–24)
BUN: 22 mg/dL (ref 8–27)
Bilirubin Total: 1.5 mg/dL — ABNORMAL HIGH (ref 0.0–1.2)
CO2: 21 mmol/L (ref 20–29)
Calcium: 9.1 mg/dL (ref 8.6–10.2)
Chloride: 104 mmol/L (ref 96–106)
Creatinine, Ser: 1.22 mg/dL (ref 0.76–1.27)
GFR calc Af Amer: 73 mL/min/{1.73_m2} (ref >59)
GFR calc non Af Amer: 63 mL/min/{1.73_m2} (ref >59)
GLOBULIN, TOTAL: 2.6 g/dL (ref 1.5–4.5)
Glucose: 89 mg/dL (ref 65–99)
POTASSIUM: 4.6 mmol/L (ref 3.5–5.2)
SODIUM: 140 mmol/L (ref 134–144)
Total Protein: 6.8 g/dL (ref 6.0–8.5)

## 2017-12-25 LAB — IRON,TIBC AND FERRITIN PANEL
Ferritin: 85 ng/mL (ref 30–400)
IRON: 108 ug/dL (ref 38–169)
Iron Saturation: 33 % (ref 15–55)
Total Iron Binding Capacity: 331 ug/dL (ref 250–450)
UIBC: 223 ug/dL (ref 111–343)

## 2017-12-25 LAB — PROTIME-INR
INR: 1.2 (ref 0.8–1.2)
Prothrombin Time: 12.4 s — ABNORMAL HIGH (ref 9.1–12.0)

## 2017-12-25 LAB — LIPID PANEL
CHOL/HDL RATIO: 4.2 ratio (ref 0.0–5.0)
Cholesterol, Total: 117 mg/dL (ref 100–199)
HDL: 28 mg/dL — AB (ref >39)
LDL Calculated: 76 mg/dL (ref 0–99)
Triglycerides: 66 mg/dL (ref 0–149)
VLDL Cholesterol Cal: 13 mg/dL (ref 5–40)

## 2017-12-25 LAB — APTT: aPTT: 31 s (ref 24–33)

## 2017-12-25 LAB — TSH: TSH: 1.18 u[IU]/mL (ref 0.450–4.500)

## 2017-12-25 LAB — PSA: PROSTATE SPECIFIC AG, SERUM: 0.4 ng/mL (ref 0.0–4.0)

## 2017-12-25 NOTE — Telephone Encounter (Signed)
Copied from CRM [id number]. Topic: Quick Communication - See Telephone Encounter >> Dec 25, 2017  4:58 PM Lorrine Kin, NT wrote: CRM for notification. See Telephone encounter for: 12/25/17. Patient is calling and is wondering why he only received 8 pills of his traZODone (DESYREL) 50 MG tablet. Would like to know why he did not get a 30 day supply. Please advise. CB#: [phone] [id number] - [place], [place] - [address] AT [facility] [address]

## 2017-12-26 NOTE — Telephone Encounter (Signed)
Patient was advised to call pharmacy.   Patient voiced understanding

## 2018-01-05 ENCOUNTER — Other Ambulatory Visit: Payer: Self-pay | Admitting: Family Medicine

## 2018-01-07 ENCOUNTER — Other Ambulatory Visit: Payer: Self-pay | Admitting: Family Medicine

## 2018-01-28 ENCOUNTER — Ambulatory Visit: Payer: Self-pay | Admitting: *Deleted

## 2018-01-28 NOTE — Telephone Encounter (Signed)
Pt reports lightheadedness x 2-3 weeks. States positional, worse sitting to standing. Bending over. States vision blurred with episodes, lasting "About 10 seconds." Dizzy episodes last "Less than 1 minute." States B/P has been running 100/65, staying in that range but reports "Low for me." States "B/P meds had to be adjusted before." Denies any other symptoms. On Lasix 40mg  QD and Spironolactone 100mg  QD. States has been taking as ordered. Pt requesting to see Dr. Clelia CroftShaw only. Requests to be seen today. [place]. re: call to practice "To be fit in." Pt made aware of scheduling availabilities.  NT called practice, spoke to [place]. Pt made aware office would return call re: securing appt.Trenton Gammon. Verbalizes understanding. Instructed to go to ED/UC if symptoms worsen. Please advise:   [phone]  Reason for Disposition . Taking a medicine that could cause dizziness (e.g., blood pressure medications, diuretics)  Answer Assessment - Initial Assessment Questions 1. DESCRIPTION: "Describe your dizziness."     lightheadedness 2. LIGHTHEADED: "Do you feel lightheaded?" (e.g., somewhat faint, woozy, weak upon standing)    yes 3. VERTIGO: "Do you feel like either you or the room is spinning or tilting?" (i.e. vertigo)     no 4. SEVERITY: "How bad is it?"  "Do you feel like you are going to faint?" "Can you stand and walk?"   - MILD - walking normally   - MODERATE - interferes with normal activities (e.g., work, school)    - SEVERE - unable to stand, requires support to walk, feels like passing out now.      moderate 5. ONSET:  "When did the dizziness begin?"     2-3 weeks ago 6. AGGRAVATING FACTORS: "Does anything make it worse?" (e.g., standing, change in head position)     Positional, first standing, bend over 7. HEART RATE: "Can you tell me your heart rate?" "How many beats in 15 seconds?"  (Note: not all patients can do this)      80  8. CAUSE: "What do you think is causing the dizziness?"     Unsure 9.  RECURRENT SYMPTOM: "Have you had dizziness before?" If so, ask: "When was the last time?" "What happened that time?"     Yes, with BP meds 10. OTHER SYMPTOMS: "Do you have any other symptoms?" (e.g., fever, chest pain, vomiting, diarrhea, bleeding)       Blurred vision with episodes, lasting10 secs  Protocols used: DIZZINESS Tereasa Coop- LIGHTHEADEDNESS-A-AH

## 2018-02-04 ENCOUNTER — Other Ambulatory Visit: Payer: Self-pay

## 2018-02-04 ENCOUNTER — Ambulatory Visit (INDEPENDENT_AMBULATORY_CARE_PROVIDER_SITE_OTHER): Payer: Managed Care, Other (non HMO) | Admitting: Family Medicine

## 2018-02-04 ENCOUNTER — Encounter

## 2018-02-04 ENCOUNTER — Other Ambulatory Visit: Payer: Self-pay | Admitting: Family Medicine

## 2018-02-04 ENCOUNTER — Encounter: Payer: Self-pay | Admitting: Family Medicine

## 2018-02-04 VITALS — BP 107/66 | HR 75 | Temp 98.3°F | Ht 77.0 in | Wt 209.8 lb

## 2018-02-04 DIAGNOSIS — E786 Lipoprotein deficiency: Secondary | ICD-10-CM | POA: Diagnosis not present

## 2018-02-04 DIAGNOSIS — I951 Orthostatic hypotension: Secondary | ICD-10-CM | POA: Diagnosis not present

## 2018-02-04 DIAGNOSIS — G2581 Restless legs syndrome: Secondary | ICD-10-CM | POA: Diagnosis not present

## 2018-02-04 DIAGNOSIS — Z79899 Other long term (current) drug therapy: Secondary | ICD-10-CM | POA: Diagnosis not present

## 2018-02-04 DIAGNOSIS — K7460 Unspecified cirrhosis of liver: Secondary | ICD-10-CM | POA: Diagnosis not present

## 2018-02-04 DIAGNOSIS — I952 Hypotension due to drugs: Secondary | ICD-10-CM

## 2018-02-04 DIAGNOSIS — R42 Dizziness and giddiness: Secondary | ICD-10-CM | POA: Diagnosis not present

## 2018-02-04 DIAGNOSIS — F5102 Adjustment insomnia: Secondary | ICD-10-CM | POA: Diagnosis not present

## 2018-02-04 LAB — POCT URINALYSIS DIP (MANUAL ENTRY)
BILIRUBIN UA: NEGATIVE
GLUCOSE UA: NEGATIVE mg/dL
Ketones, POC UA: NEGATIVE mg/dL
LEUKOCYTES UA: NEGATIVE
NITRITE UA: NEGATIVE
Protein Ur, POC: NEGATIVE mg/dL
RBC UA: NEGATIVE
Spec Grav, UA: 1.02 (ref 1.010–1.025)
Urobilinogen, UA: 0.2 E.U./dL
pH, UA: 7 (ref 5.0–8.0)

## 2018-02-04 MED ORDER — SPIRONOLACTONE 50 MG PO TABS: 50.0000 mg | ORAL_TABLET | Freq: Every day | ORAL | 1 refills | Status: DC

## 2018-02-04 MED ORDER — TRAZODONE HCL 50 MG PO TABS: 100.0000 mg | ORAL_TABLET | Freq: Every day | ORAL | 3 refills | Status: DC

## 2018-02-04 MED ORDER — FUROSEMIDE 20 MG PO TABS: 20.0000 mg | ORAL_TABLET | Freq: Every day | ORAL | 1 refills | Status: DC

## 2018-02-04 MED ORDER — GABAPENTIN 100 MG PO CAPS
100.0000 mg | ORAL_CAPSULE | Freq: Three times a day (TID) | ORAL | 3 refills | Status: DC
Start: 2018-02-04 — End: 2018-06-24

## 2018-02-04 MED ORDER — GABAPENTIN 300 MG PO CAPS: 300.0000 mg | ORAL_CAPSULE | Freq: Every day | ORAL | 0 refills | Status: DC

## 2018-02-04 NOTE — Progress Notes (Signed)
Subjective:    Patient ID: Ronald Zhang, male    DOB: 09/08/1955, 62 y.o.   MRN: [id number] Chief Complaint  Patient presents with  . light headed and dizzy    may be due to low BP- going on 2-3 weeks   . sleepy    while driving     HPI BP went down to 90s systolic  Dr. Letta Moynahan had him see a dietician and get on low sodium diet and he reads the Na on the packaging and tries to get it <6% - certainly no more than 8%.  Does not eat pizza and hot dogs as knows they had to much na. With the low na diet he has just naturally cut down tow 1/2 L at night rather than a whole L Eating a lot more veggies and fruit.   Sleep has been an issue - now sleeping at night as on vacation - but typically when working he might wake up after 1-2 hrs of sleep and feels well-rested when woking nights.    He is taking gabapentin and ropinorole and trazodone in the mornings before he goes to sleep.  He doesn't feel like he needs the 300mg  of gabapentin which does the job of treating his restless legs.   Balance is off and things gets fuzzy -    Review of Systems  HENT: Negative for congestion, ear discharge, ear pain, facial swelling, hearing loss, nosebleeds, postnasal drip, rhinorrhea, sinus pressure, sinus pain, sneezing, sore throat and tinnitus.   Eyes: Positive for visual disturbance.  Respiratory: Negative for chest tightness.   Cardiovascular: Negative for chest pain, palpitations and leg swelling.  Gastrointestinal: Negative for abdominal distention, abdominal pain, anal bleeding, blood in stool, constipation, diarrhea, nausea and vomiting.  Skin: Negative for color change and rash.  Allergic/Immunologic: Positive for immunocompromised state.  Neurological: Positive for dizziness (denies vertigo, decribes as unstable) and light-headedness. Negative for seizures, syncope, facial asymmetry, speech difficulty and numbness.  Hematological: Does not bruise/bleed easily.    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 Negative for agitation, behavioral problems and confusion.   See hpi    Objective:   Physical Exam  Constitutional: He is oriented to person, place, and time. He appears well-developed and well-nourished. No distress.  HENT:  Head: Normocephalic and atraumatic.  Right Ear: Tympanic membrane, external ear and ear canal normal.  Left Ear: Tympanic membrane, external ear and ear canal normal.  Nose: Nose normal.  Mouth/Throat: Oropharynx is clear and moist and mucous membranes are normal. No oropharyngeal exudate.  Eyes: Conjunctivae are normal. No scleral icterus.  Neck: Normal range of motion. Neck supple. No thyromegaly present.  Cardiovascular: Normal rate, regular rhythm, normal heart sounds and intact distal pulses.  Pulmonary/Chest: Effort normal and breath sounds normal. No respiratory distress.  Abdominal: Soft. Bowel sounds are normal. He exhibits no shifting dullness, no distension (minimal to no ascites), no pulsatile liver, no ascites and no mass. There is no tenderness. There is no rebound and no guarding.  Musculoskeletal: He exhibits no edema.  Lymphadenopathy:    He has no cervical adenopathy.  Neurological: He is alert and oriented to person, place, and time.  Skin: Skin is warm and dry. He is not diaphoretic. No erythema.  Psychiatric: He has a normal mood and affect. His behavior is normal.      BP 107/66 (BP Location: Left Arm, Patient Position: Sitting, Cuff Size: Normal)   Pulse 75  Temp 98.3 F (36.8 C) (Oral)   Ht 6\' 5"  (1.956 m)   Wt 209 lb 12.8 oz (95.2 kg)   SpO2 (!) 75%   BMI 24.88 kg/m   Orthostatic VS for the past 24 hrs (Last 3 readings):  BP- Lying Pulse- Lying BP- Sitting Pulse- Sitting BP- Standing at 0 minutes Pulse- Standing at 0 minutes BP- Standing at 3 minutes Pulse- Standing at 3 minutes  02/04/18 1351 107/69 70 102/69 71 101/72 71 98/64  77   EKG: NSR, no acute ischemic changes noted. No significant change noted when compared to prior EKG done 12/24/2017 other than now flipped T waves in V1 likely insignificant and QT now normalized from 414 from >450 I have personally reviewed the EKG tracing and agree with the computer interpretation. Sinus  Rhythm  WITHIN NORMAL LIMITS    Assessment & Plan:   1. Orthostatic hypotension - suspect dizziness is from hypotension which has progressively increased since saw the dietician several months ago and she put him on a low salt diet - strict instructions not to consume >2000mg  Na/d and has to be less than 6% of RDA and has cut out high salt foods that he can't check so BP gone down sig - now hot weather causing some dehydration and exacerbated by not currently working - is on vacation - so fluid intake has been cut in half so he already stopped his lasix himself for past few days - only taking spironolactone 100 -  Decrease both meds by half as need to keep ratio same for cirrhosis - decrease down to spironolactone 50 and furosemide 20 daily. Keep w/ low salt diet but increase water intake and make sure sufficient protein in diet.  Need to ensure sxs resolve and ascites doesn't recur w/ dose change.  2. Hypotension due to drugs   3. Lightheaded   4. Cirrhosis of liver without ascites, unspecified hepatic cirrhosis type [facility](HCC) - now [facility] at [facility] Dr. Sherryll BurgerShah - to be seen q5 mos w/ last visit there just 3 wks prior - told that he is not currently severe enough to be a candidate for transplant but that if he ever took a sudden/sharp turn for the worse, he would be a candidate so will cont to f/u w/ Dr. Sherryll BurgerShah q6 moa - pt did not like [facility] but learned a lot about his disease so worth it and will keep on going.  ALso following annually w/ Dr. Melvyn NethLewis heme[facility] due to thrombocytopenia which is currently stable last visit 11/2017.  5.  Long-term use of high-risk medication   6.      Low HDL (under 40) - Reviewed low HDL on last labs - diet changes to increase HDL in Mediterranean diet. Has been taking omega-3 fish oil supp for 2 mos before labs were drawn as well. 7.      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       Restless leg - the increase in the gabapentin 300 made him feel foggy at first - thinks the dose before bed has stopped causing a hangover and does help restless legs but would like to go back to the 100s so can have some  of those to take prn during day when sxs occ bother him - 300 to strong for that.  Orders Placed This Encounter  Procedures  . CBC with Differential/Platelet  . Comprehensive metabolic panel  . Orthostatic vital signs  . POCT urinalysis dipstick  . EKG 12-Lead    Meds ordered this encounter  Medications  . gabapentin (NEURONTIN) 100 MG capsule    Sig: Take 1 capsule (100 mg total) by mouth 3 (three) times daily. As needed for restless legs and pain    Dispense:  90 capsule    Refill:  3  . furosemide (LASIX) 20 MG tablet    Sig: Take 1 tablet (20 mg total) by mouth daily. TAKE 1 TABLET(40 MG) BY MOUTH DAILY    Dispense:  90 tablet    Refill:  1  . spironolactone (ALDACTONE) 50 MG tablet    Sig: Take 1 tablet (50 mg total) by mouth daily.    Dispense:  90 tablet    Refill:  1  . traZODone (DESYREL) 50 MG tablet    Sig: Take 2 tablets (100 mg total) by mouth at bedtime.    Dispense:  180 tablet    Refill:  3  . gabapentin (NEURONTIN) 300 MG capsule    Sig: Take 1 capsule (300 mg total) by mouth at bedtime. For restless legs    Dispense:  270 capsule    Refill:  0     Norberto Sorenson, M.D.  Primary Care at [facility] [address] [place], [place] [address] [phone]  phone [phone] fax  02/06/18 2:22 PM

## 2018-02-04 NOTE — Patient Instructions (Addendum)
   IF you received an x-ray today, you will receive an invoice from [facility]. Please contact [facility] at [phone] with questions or concerns regarding your invoice.   IF you received labwork today, you will receive an invoice from [facility]. Please contact [facility] at [phone] with questions or concerns regarding your invoice.   Our billing staff will not be able to assist you with questions regarding bills from these companies.  You will be contacted with the lab results as soon as they are available. The fastest way to get your results is to activate your My Chart account. Instructions are located on the last page of this paperwork. If you have not heard from us regarding the results in 2 weeks, please contact this office.    Mediterranean Diet A Mediterranean diet refers to food and lifestyle choices that are based on the traditions of countries located on the [place]. This way of eating has been shown to help prevent certain conditions and improve outcomes for people who have chronic diseases, like kidney disease and heart disease. What are tips for following this plan? Lifestyle Cook and eat meals together with your family, when possible. Drink enough fluid to keep your urine clear or pale yellow. Be physically active every day. This includes: Aerobic exercise like running or swimming. Leisure activities like gardening, walking, or housework. Get 7-8 hours of sleep each night. If recommended by your health care provider, drink red wine in moderation. This means 1 glass a day for nonpregnant women and 2 glasses a day for men. A glass of wine equals 5 oz (150 mL). Reading food labels Check the serving size of packaged foods. For foods such as rice and pasta, the serving size refers to the amount of cooked product, not dry. Check the total fat in packaged foods. Avoid foods that have saturated fat or trans fats. Check the ingredients list for  added sugars, such as corn syrup. Shopping At the grocery store, buy most of your food from the areas near the walls of the store. This includes: Fresh fruits and vegetables (produce). Grains, beans, nuts, and seeds. Some of these may be available in unpackaged forms or large amounts (in bulk). Fresh seafood. Poultry and eggs. Low-fat dairy products. Buy whole ingredients instead of prepackaged foods. Buy fresh fruits and vegetables in-season from local farmers markets. Buy frozen fruits and vegetables in resealable bags. If you do not have access to quality fresh seafood, buy precooked frozen shrimp or canned fish, such as tuna, salmon, or sardines. Buy small amounts of raw or cooked vegetables, salads, or olives from the deli or salad bar at your store. Stock your pantry so you always have certain foods on hand, such as olive oil, canned tuna, canned tomatoes, rice, pasta, and beans. Cooking Cook foods with extra-virgin olive oil instead of using butter or other vegetable oils. Have meat as a side dish, and have vegetables or grains as your main dish. This means having meat in small portions or adding small amounts of meat to foods like pasta or stew. Use beans or vegetables instead of meat in common dishes like chili or lasagna. Experiment with different cooking methods. Try roasting or broiling vegetables instead of steaming or sauteing them. Add frozen vegetables to soups, stews, pasta, or rice. Add nuts or seeds for added healthy fat at each meal. You can add these to yogurt, salads, or vegetable dishes. Marinate fish or vegetables using olive oil, lemon juice, garlic, and fresh   herbs. Meal planning Plan to eat 1 vegetarian meal one day each week. Try to work up to 2 vegetarian meals, if possible. Eat seafood 2 or more times a week. Have healthy snacks readily available, such as: Vegetable sticks with hummus. Greek yogurt. Fruit and nut trail mix. Eat balanced meals throughout the  week. This includes: Fruit: 2-3 servings a day Vegetables: 4-5 servings a day Low-fat dairy: 2 servings a day Fish, poultry, or lean meat: 1 serving a day Beans and legumes: 2 or more servings a week Nuts and seeds: 1-2 servings a day Whole grains: 6-8 servings a day Extra-virgin olive oil: 3-4 servings a day Limit red meat and sweets to only a few servings a month What are my food choices? Mediterranean diet Recommended Grains: Whole-grain pasta. Brown rice. Bulgar wheat. Polenta. Couscous. Whole-wheat bread. Oatmeal. Quinoa. Vegetables: Artichokes. Beets. Broccoli. Cabbage. Carrots. Eggplant. Green beans. Chard. Kale. Spinach. Onions. Leeks. Peas. Squash. Tomatoes. Peppers. Radishes. Fruits: Apples. Apricots. Avocado. Berries. Bananas. Cherries. Dates. Figs. Grapes. Lemons. Melon. Oranges. Peaches. Plums. Pomegranate. Meats and other protein foods: Beans. Almonds. Sunflower seeds. Pine nuts. Peanuts. Cod. Salmon. Scallops. Shrimp. Tuna. Tilapia. Clams. Oysters. Eggs. Dairy: Low-fat milk. Cheese. Greek yogurt. Beverages: Water. Red wine. Herbal tea. Fats and oils: Extra virgin olive oil. Avocado oil. Grape seed oil. Sweets and desserts: Greek yogurt with honey. Baked apples. Poached pears. Trail mix. Seasoning and other foods: Basil. Cilantro. Coriander. Cumin. Mint. Parsley. Sage. Rosemary. Tarragon. Garlic. Oregano. Thyme. Pepper. Balsalmic vinegar. Tahini. Hummus. Tomato sauce. Olives. Mushrooms. Limit these Grains: Prepackaged pasta or rice dishes. Prepackaged cereal with added sugar. Vegetables: Deep fried potatoes (french fries). Fruits: Fruit canned in syrup. Meats and other protein foods: Beef. Pork. Lamb. Poultry with skin. Hot dogs. Bacon. Dairy: Ice cream. Sour cream. Whole milk. Beverages: Juice. Sugar-sweetened soft drinks. Beer. Liquor and spirits. Fats and oils: Butter. Canola oil. Vegetable oil. Beef fat (tallow). Lard. Sweets and desserts: Cookies. Cakes. Pies.  Candy. Seasoning and other foods: Mayonnaise. Premade sauces and marinades. The items listed may not be a complete list. Talk with your dietitian about what dietary choices are right for you. Summary The Mediterranean diet includes both food and lifestyle choices. Eat a variety of fresh fruits and vegetables, beans, nuts, seeds, and whole grains. Limit the amount of red meat and sweets that you eat. Talk with your health care provider about whether it is safe for you to drink red wine in moderation. This means 1 glass a day for nonpregnant women and 2 glasses a day for men. A glass of wine equals 5 oz (150 mL). This information is not intended to replace advice given to you by your health care provider. Make sure you discuss any questions you have with your health care provider. Document Released: 03/16/2016 Document Revised: 04/18/2016 Document Reviewed: 03/16/2016 Elsevier Interactive Patient Education  2018 Elsevier Inc.     Why follow it? Research shows. . Those who follow the Mediterranean diet have a reduced risk of heart disease  . The diet is associated with a reduced incidence of Parkinson's and Alzheimer's diseases . People following the diet may have longer life expectancies and lower rates of chronic diseases  . The Dietary Guidelines for Americans recommends the Mediterranean diet as an eating plan to promote health and prevent disease  What Is the Mediterranean Diet?  . Healthy eating plan based on typical foods and recipes of Mediterranean-style cooking . The diet is primarily a plant based diet; these foods should make   up a majority of meals   Starches - Plant based foods should make up a majority of meals - They are an important sources of vitamins, minerals, energy, antioxidants, and fiber - Choose whole grains, foods high in fiber and minimally processed items  - Typical grain sources include wheat, oats, barley, corn, brown rice, bulgar, farro, millet, polenta, couscous   - Various types of beans include chickpeas, lentils, fava beans, black beans, white beans   Fruits  Veggies - Large quantities of antioxidant rich fruits & veggies; 6 or more servings  - Vegetables can be eaten raw or lightly drizzled with oil and cooked  - Vegetables common to the traditional Mediterranean Diet include: artichokes, arugula, beets, broccoli, brussel sprouts, cabbage, carrots, celery, collard greens, cucumbers, eggplant, kale, leeks, lemons, lettuce, mushrooms, okra, onions, peas, peppers, potatoes, pumpkin, radishes, rutabaga, shallots, spinach, sweet potatoes, turnips, zucchini - Fruits common to the Mediterranean Diet include: apples, apricots, avocados, cherries, clementines, dates, figs, grapefruits, grapes, melons, nectarines, oranges, peaches, pears, pomegranates, strawberries, tangerines  Fats - Replace butter and margarine with healthy oils, such as olive oil, canola oil, and tahini  - Limit nuts to no more than a handful a day  - Nuts include walnuts, almonds, pecans, pistachios, pine nuts  - Limit or avoid candied, honey roasted or heavily salted nuts - Olives are central to the Mediterranean diet - can be eaten whole or used in a variety of dishes   Meats Protein - Limiting red meat: no more than a few times a month - When eating red meat: choose lean cuts and keep the portion to the size of deck of cards - Eggs: approx. 0 to 4 times a week  - Fish and lean poultry: at least 2 a week  - Healthy protein sources include, chicken, turkey, lean beef, lamb - Increase intake of seafood such as tuna, salmon, trout, mackerel, shrimp, scallops - Avoid or limit high fat processed meats such as sausage and bacon  Dairy - Include moderate amounts of low fat dairy products  - Focus on healthy dairy such as fat free yogurt, skim milk, low or reduced fat cheese - Limit dairy products higher in fat such as whole or 2% milk, cheese, ice cream  Alcohol - Moderate amounts of red wine  is ok  - No more than 5 oz daily for women (all ages) and men older than age 65  - No more than 10 oz of wine daily for men younger than 65  Other - Limit sweets and other desserts  - Use herbs and spices instead of salt to flavor foods  - Herbs and spices common to the traditional Mediterranean Diet include: basil, bay leaves, chives, cloves, cumin, fennel, garlic, lavender, marjoram, mint, oregano, parsley, pepper, rosemary, sage, savory, sumac, tarragon, thyme   It's not just a diet, it's a lifestyle:  . The Mediterranean diet includes lifestyle factors typical of those in the region  . Foods, drinks and meals are best eaten with others and savored . Daily physical activity is important for overall good health . This could be strenuous exercise like running and aerobics . This could also be more leisurely activities such as walking, housework, yard-work, or taking the stairs . Moderation is the key; a balanced and healthy diet accommodates most foods and drinks . Consider portion sizes and frequency of consumption of certain foods   Meal Ideas & Options:  . Breakfast:  o Whole wheat toast or whole wheat English muffins   with peanut butter & hard boiled egg o Steel cut oats topped with apples & cinnamon and skim milk  o Fresh fruit: banana, strawberries, melon, berries, peaches  o Smoothies: strawberries, bananas, greek yogurt, peanut butter o Low fat greek yogurt with blueberries and granola  o Egg white omelet with spinach and mushrooms o Breakfast couscous: whole wheat couscous, apricots, skim milk, cranberries  . Sandwiches:  o Hummus and grilled vegetables (peppers, zucchini, squash) on whole wheat bread   o Grilled chicken on whole wheat pita with lettuce, tomatoes, cucumbers or tzatziki  o Tuna salad on whole wheat bread: tuna salad made with greek yogurt, olives, red peppers, capers, green onions o Garlic rosemary lamb pita: lamb sauted with garlic, rosemary, salt & pepper; add  lettuce, cucumber, greek yogurt to pita - flavor with lemon juice and black pepper  . Seafood:  o Mediterranean grilled salmon, seasoned with garlic, basil, parsley, lemon juice and black pepper o Shrimp, lemon, and spinach whole-grain pasta salad made with low fat greek yogurt  o Seared scallops with lemon orzo  o Seared tuna steaks seasoned salt, pepper, coriander topped with tomato mixture of olives, tomatoes, olive oil, minced garlic, parsley, green onions and cappers  . Meats:  o Herbed greek chicken salad with kalamata olives, cucumber, feta  o Red bell peppers stuffed with spinach, bulgur, lean ground beef (or lentils) & topped with feta   o Kebabs: skewers of chicken, tomatoes, onions, zucchini, squash  o Turkey burgers: made with red onions, mint, dill, lemon juice, feta cheese topped with roasted red peppers . Vegetarian o Cucumber salad: cucumbers, artichoke hearts, celery, red onion, feta cheese, tossed in olive oil & lemon juice  o Hummus and whole grain pita points with a greek salad (lettuce, tomato, feta, olives, cucumbers, red onion) o Lentil soup with celery, carrots made with vegetable broth, garlic, salt and pepper  o Tabouli salad: parsley, bulgur, mint, scallions, cucumbers, tomato, radishes, lemon juice, olive oil, salt and pepper.      

## 2018-02-05 LAB — COMPREHENSIVE METABOLIC PANEL
ALBUMIN: 4.1 g/dL (ref 3.6–4.8)
ALK PHOS: 83 IU/L (ref 39–117)
ALT: 26 IU/L (ref 0–44)
AST: 26 IU/L (ref 0–40)
Albumin/Globulin Ratio: 1.6 (ref 1.2–2.2)
BILIRUBIN TOTAL: 0.9 mg/dL (ref 0.0–1.2)
BUN / CREAT RATIO: 17 (ref 10–24)
BUN: 20 mg/dL (ref 8–27)
CHLORIDE: 106 mmol/L (ref 96–106)
CO2: 21 mmol/L (ref 20–29)
Calcium: 9.1 mg/dL (ref 8.6–10.2)
Creatinine, Ser: 1.19 mg/dL (ref 0.76–1.27)
GFR calc non Af Amer: 65 mL/min/{1.73_m2} (ref >59)
GFR, EST AFRICAN AMERICAN: 75 mL/min/{1.73_m2} (ref >59)
GLUCOSE: 87 mg/dL (ref 65–99)
Globulin, Total: 2.5 g/dL (ref 1.5–4.5)
Potassium: 4.8 mmol/L (ref 3.5–5.2)
SODIUM: 138 mmol/L (ref 134–144)
Total Protein: 6.6 g/dL (ref 6.0–8.5)

## 2018-02-05 LAB — CBC WITH DIFFERENTIAL/PLATELET
BASOS ABS: 0 10*3/uL (ref 0.0–0.2)
Basos: 0 %
EOS (ABSOLUTE): 0.1 10*3/uL (ref 0.0–0.4)
Eos: 3 %
Hematocrit: 48.4 % (ref 37.5–51.0)
Hemoglobin: 16.8 g/dL (ref 13.0–17.7)
Immature Grans (Abs): 0 10*3/uL (ref 0.0–0.1)
Immature Granulocytes: 0 %
LYMPHS ABS: 0.9 10*3/uL (ref 0.7–3.1)
Lymphs: 23 %
MCH: 31.3 pg (ref 26.6–33.0)
MCHC: 34.7 g/dL (ref 31.5–35.7)
MCV: 90 fL (ref 79–97)
MONOCYTES: 9 %
Monocytes Absolute: 0.4 10*3/uL (ref 0.1–0.9)
Neutrophils Absolute: 2.7 10*3/uL (ref 1.4–7.0)
Neutrophils: 65 %
PLATELETS: 55 10*3/uL — AB (ref 150–450)
RBC: 5.36 x10E6/uL (ref 4.14–5.80)
RDW: 14.5 % (ref 12.3–15.4)
WBC: 4.1 10*3/uL (ref 3.4–10.8)

## 2018-02-06 DIAGNOSIS — E786 Lipoprotein deficiency: Secondary | ICD-10-CM | POA: Insufficient documentation

## 2018-03-21 ENCOUNTER — Other Ambulatory Visit: Payer: Self-pay | Admitting: Family Medicine

## 2018-03-21 NOTE — Telephone Encounter (Signed)
Ropinirole refill Last Refill: 12/24/17; # 180; no refills Last OV: 02/04/18 PCP: Dr/ Penni HomansSjaw Pharmacy: [facility] on [address] [place]

## 2018-04-09 ENCOUNTER — Ambulatory Visit: Payer: Self-pay

## 2018-04-09 NOTE — Telephone Encounter (Signed)
Incoming call from patient.  Complaint of low blood Pressure . Was 115/75 taken during call 114/82 via automatic machine. Patient concerned that new medication may be making blood pressure to low.  Pulse was 82 Feel occasional dizziness.  Patient states he has been eating better.  Wanted an appointment with Dr. Clelia Croft . Related to patient that Dr. Clelia Croft will be out through Sept.  18th.  Patient states that he would call back tomorrow. Really wants to see Dr. Clelia Croft.  Offered other providers.  Provided care advice. Voiced understanding.   Reason for Disposition . Brief (now gone) dizziness or lightheadedness after standing up or eating  Answer Assessment - Initial Assessment Questions 1. BLOOD PRESSURE: "What is the blood pressure?" "Did you take at least two measurements 5 minutes apart?"     115/75  2. ONSET: "When did you take your blood pressure?"     Now  114/81 hr 82 3. HOW: "How did you obtain the blood pressure?" (e.g., visiting nurse, automatic home BP monitor)     automatic 4. HISTORY: "Do you have a history of low blood pressure?" "What is your blood pressure normally?"    no 5. MEDICATIONS: "Are you taking any medications for blood pressure?" If yes: "Have they been changed recently?"     spirolactone 6. PULSE RATE: "Do you know what your pulse rate is?"      *No Answer* 7. OTHER SYMPTOMS: "Have you been sick recently?" "Have you had a recent injury?"     Eating better 8. PREGNANCY: "Is there any chance you are pregnant?" "When was your last menstrual period?"     na  Protocols used: LOW BLOOD PRESSURE-A-AH

## 2018-04-11 NOTE — Telephone Encounter (Signed)
Pt has appt with Dr. Clelia Croft 11/14

## 2018-04-12 ENCOUNTER — Ambulatory Visit (INDEPENDENT_AMBULATORY_CARE_PROVIDER_SITE_OTHER): Payer: Managed Care, Other (non HMO) | Admitting: Family Medicine

## 2018-04-12 ENCOUNTER — Encounter: Payer: Self-pay | Admitting: Family Medicine

## 2018-04-12 ENCOUNTER — Other Ambulatory Visit: Payer: Self-pay

## 2018-04-12 VITALS — BP 113/72 | HR 70 | Temp 97.9°F | Resp 16 | Ht 77.0 in | Wt 217.6 lb

## 2018-04-12 DIAGNOSIS — Z8679 Personal history of other diseases of the circulatory system: Secondary | ICD-10-CM | POA: Diagnosis not present

## 2018-04-12 DIAGNOSIS — R42 Dizziness and giddiness: Secondary | ICD-10-CM | POA: Diagnosis not present

## 2018-04-12 DIAGNOSIS — I952 Hypotension due to drugs: Secondary | ICD-10-CM | POA: Diagnosis not present

## 2018-04-12 NOTE — Patient Instructions (Addendum)
  Stay off the spironolactone.  Monitor your blood pressure.  If it is starting to climb come back in for reassessment.  If you continue to have lightheaded spells come back in for further assessment.  The combination of your medications might cause you to be a little bit drowsy at times, so make sure you are always alert when you are driving or get someone else to drive for you.  Return as needed or keep your regular appointment in November.   If you have lab work done today you will be contacted with your lab results within the next 2 weeks.  If you have not heard from [place] then please contact us. The fastest way to get your results is to register for My Chart.   IF you received an x-ray today, you will receive an invoice from [facility]. Please contact [facility] at [phone] with questions or concerns regarding your invoice.   IF you received labwork today, you will receive an invoice from [facility]. Please contact [facility] at [phone] with questions or concerns regarding your invoice.   Our billing staff will not be able to assist you with questions regarding bills from these companies.  You will be contacted with the lab results as soon as they are available. The fastest way to get your results is to activate your My Chart account. Instructions are located on the last page of this paperwork. If you have not heard from [place] regarding the results in 2 weeks, please contact this office.
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
## 2018-06-20 ENCOUNTER — Ambulatory Visit: Payer: Managed Care, Other (non HMO) | Admitting: Family Medicine

## 2018-06-24 ENCOUNTER — Other Ambulatory Visit: Payer: Self-pay | Admitting: Family Medicine

## 2018-06-24 ENCOUNTER — Ambulatory Visit (INDEPENDENT_AMBULATORY_CARE_PROVIDER_SITE_OTHER): Payer: Managed Care, Other (non HMO) | Admitting: Family Medicine

## 2018-06-24 ENCOUNTER — Encounter: Payer: Self-pay | Admitting: Family Medicine

## 2018-06-24 ENCOUNTER — Other Ambulatory Visit: Payer: Self-pay

## 2018-06-24 VITALS — BP 131/75 | HR 65 | Temp 98.0°F | Resp 16 | Ht 77.0 in | Wt 216.0 lb

## 2018-06-24 DIAGNOSIS — Z1212 Encounter for screening for malignant neoplasm of rectum: Secondary | ICD-10-CM | POA: Diagnosis not present

## 2018-06-24 DIAGNOSIS — I10 Essential (primary) hypertension: Secondary | ICD-10-CM

## 2018-06-24 DIAGNOSIS — Z0001 Encounter for general adult medical examination with abnormal findings: Secondary | ICD-10-CM | POA: Diagnosis not present

## 2018-06-24 DIAGNOSIS — Z1211 Encounter for screening for malignant neoplasm of colon: Secondary | ICD-10-CM | POA: Diagnosis not present

## 2018-06-24 DIAGNOSIS — K7460 Unspecified cirrhosis of liver: Secondary | ICD-10-CM | POA: Diagnosis not present

## 2018-06-24 DIAGNOSIS — J4531 Mild persistent asthma with (acute) exacerbation: Secondary | ICD-10-CM

## 2018-06-24 DIAGNOSIS — Z79899 Other long term (current) drug therapy: Secondary | ICD-10-CM

## 2018-06-24 DIAGNOSIS — Z23 Encounter for immunization: Secondary | ICD-10-CM

## 2018-06-24 DIAGNOSIS — K635 Polyp of colon: Secondary | ICD-10-CM | POA: Diagnosis not present

## 2018-06-24 DIAGNOSIS — E611 Iron deficiency: Secondary | ICD-10-CM

## 2018-06-24 DIAGNOSIS — Z0000 Encounter for general adult medical examination without abnormal findings: Secondary | ICD-10-CM

## 2018-06-24 DIAGNOSIS — D696 Thrombocytopenia, unspecified: Secondary | ICD-10-CM | POA: Diagnosis not present

## 2018-06-24 MED ORDER — ZOSTER VAC RECOMB ADJUVANTED 50 MCG/0.5ML IM SUSR: 0.5000 mL | Freq: Once | INTRAMUSCULAR | 1 refills | Status: AC

## 2018-06-24 MED ORDER — FUROSEMIDE 20 MG PO TABS: 20.0000 mg | ORAL_TABLET | Freq: Two times a day (BID) | ORAL | 1 refills | Status: DC

## 2018-06-24 MED ORDER — ALBUTEROL SULFATE (2.5 MG/3ML) 0.083% IN NEBU: 2.5000 mg | INHALATION_SOLUTION | Freq: Once | RESPIRATORY_TRACT | Status: AC

## 2018-06-24 MED ORDER — GABAPENTIN 100 MG PO CAPS: 100.0000 mg | ORAL_CAPSULE | Freq: Three times a day (TID) | ORAL | 1 refills | Status: DC

## 2018-06-24 MED ORDER — GABAPENTIN 300 MG PO CAPS: 300.0000 mg | ORAL_CAPSULE | Freq: Every day | ORAL | 3 refills | Status: DC

## 2018-06-24 MED ORDER — BUDESONIDE-FORMOTEROL FUMARATE 160-4.5 MCG/ACT IN AERO: 2.0000 | INHALATION_SPRAY | Freq: Two times a day (BID) | RESPIRATORY_TRACT | 5 refills | Status: DC

## 2018-06-24 MED ORDER — OMEPRAZOLE 20 MG PO CPDR: DELAYED_RELEASE_CAPSULE | ORAL | 3 refills | Status: DC

## 2018-06-24 MED ORDER — ROPINIROLE HCL 2 MG PO TABS: 4.0000 mg | ORAL_TABLET | Freq: Every day | ORAL | 1 refills | Status: DC

## 2018-06-24 NOTE — Patient Instructions (Addendum)
If you have lab work done today you will be contacted with your lab results within the next 2 weeks.  If you have not heard from [place] then please contact us. The fastest way to get your results is to register for My Chart.   IF you received an x-ray today, you will receive an invoice from [facility]. Please contact [facility] at [phone] with questions or concerns regarding your invoice.   IF you received labwork today, you will receive an invoice from [facility]. Please contact [facility] at [phone] with questions or concerns regarding your invoice.   Our billing staff will not be able to assist you with questions regarding bills from these companies.  You will be contacted with the lab results as soon as they are available. The fastest way to get your results is to activate your My Chart account. Instructions are located on the last page of this paperwork. If you have not heard from [place] regarding the results in 2 weeks, please contact this office.    Potassium Content of Foods Potassium is a mineral found in many foods and drinks. It helps keep fluids and minerals balanced in your body and affects how steadily your heart beats. Potassium also helps control your blood pressure and keep your muscles and nervous system healthy. Certain health conditions and medicines may change the balance of potassium in your body. When this happens, you can help balance your level of potassium through the foods that you do or do not eat. Your health care provider or dietitian may recommend an amount of potassium that you should have each day. The following lists of foods provide the amount of potassium (in parentheses) per serving in each item. High in potassium The following foods and beverages have 200 mg or more of potassium per serving:  Apricots, 2 raw or 5 dry (200 mg).  Artichoke, 1 medium (345 mg).  Avocado, raw,  each (245 mg).  Banana, 1 medium (425 mg).  Beans,  lima, or baked beans, canned,  cup (280 mg).  Beans, white, canned,  cup (595 mg).  Beef roast, 3 oz (320 mg).  Beef, ground, 3 oz (270 mg).  Beets, raw or cooked,  cup (260 mg).  Bran muffin, 2 oz (300 mg).  Broccoli,  cup (230 mg).  Brussels sprouts,  cup (250 mg).  Cantaloupe,  cup (215 mg).  Cereal, 100% bran,  cup (200-400 mg).  Cheeseburger, single, fast food, 1 each (225-400 mg).  Chicken, 3 oz (220 mg).  Clams, canned, 3 oz (535 mg).  Crab, 3 oz (225 mg).  Dates, 5 each (270 mg).  Dried beans and peas,  cup (300-475 mg).  Figs, dried, 2 each (260 mg).  Fish: halibut, tuna, cod, snapper, 3 oz (480 mg).  Fish: salmon, haddock, swordfish, perch, 3 oz (300 mg).  Fish, tuna, canned 3 oz (200 mg).  [place] fries, fast food, 3 oz (470 mg).  Granola with fruit and nuts,  cup (200 mg).  Grapefruit juice,  cup (200 mg).  Greens, beet,  cup (655 mg).  Honeydew melon,  cup (200 mg).  Kale, raw, 1 cup (300 mg).  Kiwi, 1 medium (240 mg).  Kohlrabi, rutabaga, parsnips,  cup (280 mg).  Lentils,  cup (365 mg).  Mango, 1 each (325 mg).  Milk, chocolate, 1 cup (420 mg).  Milk: nonfat, low-fat, whole, buttermilk, 1 cup (350-380 mg).  Molasses, 1 Tbsp (295 mg).  Mushrooms,  cup (280) mg.  Nectarine, 1 each (275 mg).  Nuts: almonds, peanuts, hazelnuts, [place], cashew, mixed, 1 oz (200 mg).  Nuts, pistachios, 1 oz (295 mg).  Orange, 1 each (240 mg).  Orange juice,  cup (235 mg).  Papaya, medium,  fruit (390 mg).  Peanut butter, chunky, 2 Tbsp (240 mg).  Peanut butter, smooth, 2 Tbsp (210 mg).  Pear, 1 medium (200 mg).  Pomegranate, 1 whole (400 mg).  Pomegranate juice,  cup (215 mg).  Pork, 3 oz (350 mg).  Potato chips, salted, 1 oz (465 mg).  Potato, baked with skin, 1 medium (925 mg).  Potatoes, boiled,  cup (255 mg).  Potatoes, mashed,  cup (330 mg).  Prune juice,  cup (370 mg).  Prunes, 5 each (305  mg).  Pudding, chocolate,  cup (230 mg).  Pumpkin, canned,  cup (250 mg).  Raisins, seedless,  cup (270 mg).  Seeds, sunflower or pumpkin, 1 oz (240 mg).  Soy milk, 1 cup (300 mg).  Spinach,  cup (420 mg).  Spinach, canned,  cup (370 mg).  Sweet potato, baked with skin, 1 medium (450 mg).  Swiss chard,  cup (480 mg).  Tomato or vegetable juice,  cup (275 mg).  Tomato sauce or puree,  cup (400-550 mg).  Tomato, raw, 1 medium (290 mg).  Tomatoes, canned,  cup (200-300 mg).  [place], 3 oz (250 mg).  Wheat germ, 1 oz (250 mg).  Winter squash,  cup (250 mg).  Yogurt, plain or fruited, 6 oz (260-435 mg).  Zucchini,  cup (220 mg).  Moderate in potassium The following foods and beverages have 50-200 mg of potassium per serving:  Apple, 1 each (150 mg).  Apple juice,  cup (150 mg).  Applesauce,  cup (90 mg).  Apricot nectar,  cup (140 mg).  Asparagus, small spears,  cup or 6 spears (155 mg).  Bagel, cinnamon raisin, 1 each (130 mg).  Bagel, egg or plain, 4 in., 1 each (70 mg).  Beans, [place],  cup (90 mg).  Beans, yellow,  cup (190 mg).  Beer, regular, 12 oz (100 mg).  Beets, canned,  cup (125 mg).  Blackberries,  cup (115 mg).  Blueberries,  cup (60 mg).  Bread, whole wheat, 1 slice (70 mg).  Broccoli, raw,  cup (145 mg).  Cabbage,  cup (150 mg).  Carrots, cooked or raw,  cup (180 mg).  Cauliflower, raw,  cup (150 mg).  Celery, raw,  cup (155 mg).  Cereal, bran flakes, cup (120-150 mg).  Cheese, cottage,  cup (110 mg).  Cherries, 10 each (150 mg).  Chocolate, 1 oz bar (165 mg).  Coffee, brewed 6 oz (90 mg).  Corn,  cup or 1 ear (195 mg).  Cucumbers,  cup (80 mg).  Egg, large, 1 each (60 mg).  Eggplant,  cup (60 mg).  Endive, raw, cup (80 mg).  English muffin, 1 each (65 mg).  Fish, orange roughy, 3 oz (150 mg).  Frankfurter, beef or pork, 1 each (75 mg).  Fruit cocktail,  cup (115  mg).  Grape juice,  cup (170 mg).  Grapefruit,  fruit (175 mg).  Grapes,  cup (155 mg).  Greens: kale, turnip, collard,  cup (110-150 mg).  Ice cream or frozen yogurt, chocolate,  cup (175 mg).  Ice cream or frozen yogurt, vanilla,  cup (120-150 mg).  Lemons, limes, 1 each (80 mg).  Lettuce, all types, 1 cup (100 mg).  Mixed vegetables,  cup (150 mg).  Mushrooms, raw,  cup (110 mg).  Nuts: walnuts, pecans, or macadamia, 1 oz (125 mg).  Oatmeal,  cup (80 mg).  Okra,  cup (110 mg).  Onions, raw,  cup (120 mg).  Peach, 1 each (185 mg).  Peaches, canned,  cup (120 mg).  Pears, canned,  cup (120 mg).  Peas, green, frozen,  cup (90 mg).  Peppers, green,  cup (130 mg).  Peppers, red,  cup (160 mg).  Pineapple juice,  cup (165 mg).  Pineapple, fresh or canned,  cup (100 mg).  Plums, 1 each (105 mg).  Pudding, vanilla,  cup (150 mg).  Raspberries,  cup (90 mg).  Rhubarb,  cup (115 mg).  Rice, wild,  cup (80 mg).  Shrimp, 3 oz (155 mg).  Spinach, raw, 1 cup (170 mg).  Strawberries,  cup (125 mg).  Summer squash  cup (175-200 mg).  Swiss chard, raw, 1 cup (135 mg).  Tangerines, 1 each (140 mg).  Tea, brewed, 6 oz (65 mg).  Turnips,  cup (140 mg).  Watermelon,  cup (85 mg).  Wine, red, table, 5 oz (180 mg).  Wine, white, table, 5 oz (100 mg).  Low in potassium The following foods and beverages have less than 50 mg of potassium per serving.  Bread, white, 1 slice (30 mg).  Carbonated beverages, 12 oz (less than 5 mg).  Cheese, 1 oz (20-30 mg).  Cranberries,  cup (45 mg).  Cranberry juice cocktail,  cup (20 mg).  Fats and oils, 1 Tbsp (less than 5 mg).  Hummus, 1 Tbsp (32 mg).  Nectar: papaya, mango, or pear,  cup (35 mg).  Rice, white or brown,  cup (50 mg).  Spaghetti or macaroni,  cup cooked (30 mg).  Tortilla, flour or corn, 1 each (50 mg).  Waffle, 4 in., 1 each (50 mg).  Water chestnuts,   cup (40 mg).  This information is not intended to replace advice given to you by your health care provider. Make sure you discuss any questions you have with your health care provider. Document Released: 03/07/2005 Document Revised: 12/30/2015 Document Reviewed: 06/20/2013 Elsevier Interactive Patient Education  2018 [employer]  Bone Health Bones protect organs, store calcium, and anchor muscles. Good health habits, such as eating nutritious foods and exercising regularly, are important for maintaining healthy bones. They can also help to prevent a condition that causes bones to lose density and become weak and brittle (osteoporosis). Why is bone mass important? Bone mass refers to the amount of bone tissue that you have. The higher your bone mass, the stronger your bones. An important step toward having healthy bones throughout life is to have strong and dense bones during childhood. A young adult who has a high bone mass is more likely to have a high bone mass later in life. Bone mass at its greatest it is called peak bone mass. A large decline in bone mass occurs in older adults. In women, it occurs about the time of menopause. During this time, it is important to practice good health habits, because if more bone is lost than what is replaced, the bones will become less healthy and more likely to break (fracture). If you find that you have a low bone mass, you may be able to prevent osteoporosis or further bone loss by changing your diet and lifestyle. How can I find out if my bone mass is low? Bone mass can be measured with an X-ray test that is called a bone mineral density (BMD) test. This test is recommended for  all women who are age 6 or older. It may also be recommended for men who are age 398 or older, or for people who are more likely to develop osteoporosis due to:  Having bones that break easily.  Having a long-term disease that weakens bones, such as kidney disease or rheumatoid  arthritis.  Having menopause earlier than normal.  Taking medicine that weakens bones, such as steroids, thyroid hormones, or hormone treatment for breast cancer or prostate cancer.  Smoking.  Drinking three or more alcoholic drinks each day.  What are the nutritional recommendations for healthy bones? To have healthy bones, you need to get enough of the right minerals and vitamins. Most nutrition experts recommend getting these nutrients from the foods that you eat. Nutritional recommendations vary from person to person. Ask your health care provider what is healthy for you. Here are some general guidelines. Calcium Recommendations Calcium is the most important (essential) mineral for bone health. Most people can get enough calcium from their diet, but supplements may be recommended for people who are at risk for osteoporosis. Good sources of calcium include:  Dairy products, such as low-fat or nonfat milk, cheese, and yogurt.  Dark green leafy vegetables, such as bok choy and broccoli.  Calcium-fortified foods, such as orange juice, cereal, bread, soy beverages, and tofu products.  Nuts, such as almonds.  Follow these recommended amounts for daily calcium intake:  Children, age 68?3: 700 mg.  Children, age 39?8: 1,000 mg.  Children, age 33?13: 1,300 mg.  Teens, age 684?18: 1,300 mg.  Adults, age 689?50: 1,000 mg.  Adults, age 79?70: ? Men: 1,000 mg. ? Women: 1,200 mg.  Adults, age 23 or older: 1,200 mg.  Pregnant and breastfeeding females: ? Teens: 1,300 mg. ? Adults: 1,000 mg.  Vitamin D Recommendations Vitamin D is the most essential vitamin for bone health. It helps the body to absorb calcium. Sunlight stimulates the skin to make vitamin D, so be sure to get enough sunlight. If you live in a cold climate or you do not get outside often, your health care provider may recommend that you take vitamin D supplements. Good sources of vitamin D in your diet include:  Egg  yolks.  Saltwater fish.  Milk and cereal fortified with vitamin D.  Follow these recommended amounts for daily vitamin D intake:  Children and teens, age 68?18: 600 international units.  Adults, age 67 or younger: 400-800 international units.  Adults, age 399 or older: 800-1,000 international units.  Other Nutrients Other nutrients for bone health include:  Phosphorus. This mineral is found in meat, poultry, dairy foods, nuts, and legumes. The recommended daily intake for adult men and adult women is 700 mg.  Magnesium. This mineral is found in seeds, nuts, dark green vegetables, and legumes. The recommended daily intake for adult men is 400?420 mg. For adult women, it is 310?320 mg.  Vitamin K. This vitamin is found in green leafy vegetables. The recommended daily intake is 120 mg for adult men and 90 mg for adult women.  What type of physical activity is best for building and maintaining healthy bones? Weight-bearing and strength-building activities are important for building and maintaining peak bone mass. Weight-bearing activities cause muscles and bones to work against gravity. Strength-building activities increases muscle strength that supports bones. Weight-bearing and muscle-building activities include:  Walking and hiking.  Jogging and running.  Dancing.  Gym exercises.  Lifting weights.  Tennis and racquetball.  Climbing stairs.  Aerobics.  Adults should get  at least 30 minutes of moderate physical activity on most days. Children should get at least 60 minutes of moderate physical activity on most days. Ask your health care provide what type of exercise is best for you. Where can I find more information? For more information, check out the following websites:  National Osteoporosis Foundation: http://burton-owens.org/  [employer]: http://www.niams.http://www.johnson-fowler.biz/.asp  This information is not  intended to replace advice given to you by your health care provider. Make sure you discuss any questions you have with your health care provider. Document Released: 10/14/2003 Document Revised: 02/11/2016 Document Reviewed: 07/29/2014 Elsevier Interactive Patient Education  [employer].

## 2018-06-24 NOTE — Progress Notes (Signed)
Subjective:    Patient ID: Ronald Zhang; male   DOB: [birth year]; 62 y.o.   MRN: [id number]  Chief Complaint  Patient presents with  . Annual Exam    HPI Primary Preventative Screenings: Prostate Cancer:  Lab Results  Component Value Date   PSA1 0.4 12/24/2017  STI screening: neg hep C and HIV 2017 Colorectal Cancer: Had colon[facility] by [facility]. Lorita Officerimothy [facility]isenheimer [facility] 03/30/15 which noted it would likely be repeated in 3-6 months. Tobacco use/AAA/Lung Cancer/EtOH/Illicit substances:  Cardiac:EKG 02/04/18 Weight/Blood sugar/Diet/Exercise: BMI Readings from Last 3 Encounters:  04/12/18 25.80 kg/m  02/04/18 24.88 kg/m  12/24/17 24.78 kg/m   Lab Results  Component Value Date   HGBA1C 5.3 10/05/2012   OTC/Vit/Supp/Herbal:  Increased risk of osteoporosis in pts w/ cirrhosis [facility] Dr. Clelia CroftShaw rec checking vit D level and DEXA scan if abnml. Dentist/Optho: Immunizations: refuses flu shot, has immunity to hep B but not A.  Is due to have yearly influenza vaccines, needs PCV 13 vaccine, shingrix Immunization History  Administered Date(s) Administered  . Pneumococcal Polysaccharide-23 04/11/2017  . Tdap 06/06/2017    Chronic Medical Conditions: Hypertension: Pt was on spiro 100 qd w/ lasix 40 (per cirrhosis protocol) but dev orthostatic sxs when he improved his diet and decreased sodium intake. Dose of spiro decreased to 50mg  02/2018 but orthostatic sxs persisted so 04/12/18 spironolactone was d/c'd and sxs resolved.  Checking BP outside office and had systolic in 90s when he felt lightheaded.  Cirrhosis - last [facility] by Dr. Sherryll BurgerShah 01/14/18.  He is supposed to have a liver US q6 mos for [facility]. (ordered by Dr. Jennye BoroughsMisenheimer) - if any lesion seen un US, then confirm with MRI. Ascites very well controlled, non-bleeding esophageal varices, no prior hepatic encephalopthy - overall very well  compensated. Was due to have repeat EGD with Dr. Jennye BoroughsMisenheimer and needs propranolol, carvedilol, or nadolol started (non-selective BB) if grade II or III varices.  Yesterday he did get a little dizzy after climbing a steep hill but has been able to do this usually w/o problems.   He is having some SHoB - when the temp changes - environmental allergies. Using symbicort prn which works well.    Has not been back to see Dr. Braulio ConteMeisenheimer  Medical History: Past Medical History:  Diagnosis Date  . Alcohol dependency (HCC)    stopped drinking since May 07, 2013  . Allergy   . Anemia    iron deficiency, non compliant with iron  . Arthritis    bilateral knees,secondary to trauma  . Cellulitis and abscess of leg    [facility] from 4/5-4/10/14 for bilateral LE edema and cellulitis. Was on IV Lasix and Vancomycin.   . Erectile dysfunction   . H/O non anemic vitamin B12 deficiency 2014  . Hypertension   . Insomnia    3rd shift   . Non-rheumatic mitral regurgitation    Asymptomatic, Seen by cardiology 01/26/15, Dr Norman Herr[facility]Brian Munley, [facility]   . Pancreatic cyst    Multiple, MRI of abd and pelvis in 12/2013  , ? sequelae of chronic pancreatitis , he has a hx of alcoholism, suggested repeat MRI in 6 months fro progression, appears nonmalignant  . Pulmonary HTN (HCC)    TEE on March 31,2014-mild-mod concentric hypertrophy.  EF 60-65%. Left atrial valve mildly dilated, right atrium mildly dilated, mild-moderate MR, mild TR. Pum Pressure 51 mm Hg  .  Restless leg   . Urinary incontinence    Past Surgical History:  Procedure Laterality Date  . FRACTURE SURGERY Bilateral 1972   below knee  . HEMORRHOID SURGERY    . 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 Loralie Champagne   Current Outpatient Medications on File Prior to Visit  Medication Sig Dispense Refill  . budesonide-formoterol (SYMBICORT) 160-4.5 MCG/ACT inhaler Inhale 2 puffs into the lungs 2 (two) times  daily. 10.2 g 5  . Cholecalciferol (VITAMIN D3) 1000 units CAPS Take 1,000 capsules by mouth daily.    . furosemide (LASIX) 20 MG tablet Take 1 tablet (20 mg total) by mouth daily. TAKE 1 TABLET(40 MG) BY MOUTH DAILY 90 tablet 1  . gabapentin (NEURONTIN) 100 MG capsule Take 1 capsule (100 mg total) by mouth 3 (three) times daily. As needed for restless legs and pain 90 capsule 3  . gabapentin (NEURONTIN) 300 MG capsule Take 1 capsule (300 mg total) by mouth at bedtime. For restless legs 270 capsule 0  . loratadine (CLARITIN) 10 MG tablet Take 10 mg by mouth daily.    . Omega-3 Fatty Acids (OMEGA-3 FISH OIL PO) Take 1 capsule by mouth daily.    Marland Kitchen omeprazole (PRILOSEC) 20 MG capsule TK 1 C PO QAM  0  . rOPINIRole (REQUIP) 2 MG tablet TAKE 2 TABLETS BY MOUTH DAILY SEVERAL HOURS BEFORE BEDTIME 180 tablet 0  . spironolactone (ALDACTONE) 50 MG tablet Take 1 tablet (50 mg total) by mouth daily. 90 tablet 1  . traZODone (DESYREL) 50 MG tablet Take 2 tablets (100 mg total) by mouth at bedtime. (Patient not taking: Reported on 04/12/2018) 180 tablet 3   No current facility-administered medications on file prior to visit.    Allergies  Allergen Reactions  . Penicillins Swelling    Childhood   Family History  Problem Relation Age of Onset  . Hypertension Mother   . Hypertension Father   . Alcohol abuse Brother    Social History   Socioeconomic History  . Marital status: Divorced    Spouse name: n/a  . Number of children: 2  . Years of education: 12th grade  . Highest education level: Not on file  Occupational History  . Occupation: Psychiatrist  Social Needs  . Financial resource strain: Not on file  . Food insecurity:    Worry: Not on file    Inability: Not on file  . Transportation needs:    Medical: Not on file    Non-medical: Not on file  Tobacco Use  . Smoking status: Never Smoker  . Smokeless tobacco: Never Used  Substance and Sexual Activity  . Alcohol use: No     Alcohol/week: 0.0 standard drinks    Comment: history of a pint a day, sober since 05/07/2013  . Drug use: No  . Sexual activity: Never    Birth control/protection: Abstinence  Lifestyle  . Physical activity:    Days per week: Not on file    Minutes per session: Not on file  . Stress: Not on file  Relationships  . Social connections:    Talks on phone: Not on file    Gets together: Not on file    Attends religious service: Not on file    Active member of club or organization: Not on file    Attends meetings of clubs or organizations: Not on file    Relationship status: Not on file  Other Topics Concern  . Not on file  Social History  Narrative   05/07/2013 AHW "Duane" was born and grew up in [place], [place]. He has 3 younger brothers. His parents are still together. He reports he had a good childhood. He graduated from high school. He has worked for [employer] for 35 years. He married once, and has been divorced for 30 years. He has twins, a boy and girl. He denies any legal difficulties. His hobbies include family genealogy, studying Southern history, and participating in Civil War reenactments. 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 His social support system consists of friends. 05/07/2013 AHW   Depression screen [facility] 2/9 06/24/2018 04/12/2018 02/04/2018 12/24/2017 06/06/2017  Decreased Interest 0 0 0 0 0  Down, Depressed, Hopeless 0 0 0 0 0  PHQ - 2 Score 0 0 0 0 0     ROS Otherwise as noted in HPI  Objective:  BP 131/75   Pulse 65   Temp 98 F (36.7 C) (Oral)   Resp 16   Ht 6\' 5"  (1.956 m)   Wt 216 lb (98 kg)   SpO2 98%   BMI 25.61 kg/m   Visual Acuity Screening   Right eye Left eye Both eyes  Without correction: 20/25 20/25 20/20   With correction:      Physical Exam  Constitutional: He is oriented to person, place, and time. He appears well-developed and well-nourished. No distress.  HENT:  Head: Normocephalic and atraumatic.  Right Ear: Tympanic  membrane, external ear and ear canal normal.  Left Ear: Tympanic membrane, external ear and ear canal normal.  Nose: Nose normal.  Mouth/Throat: Uvula is midline, oropharynx is clear and moist and mucous membranes are normal. No oropharyngeal exudate.  Eyes: Conjunctivae are normal. Right eye exhibits no discharge. Left eye exhibits no discharge. No scleral icterus.  Neck: Normal range of motion. Neck supple. No thyromegaly present.  Cardiovascular: Normal rate, regular rhythm, normal heart sounds and intact distal pulses.  Pulmonary/Chest: Effort normal and breath sounds normal. No respiratory distress.  Abdominal: Soft. Bowel sounds are normal. He exhibits no distension and no mass. There is no tenderness. There is no rebound and no guarding.  Musculoskeletal: He exhibits no edema.  Lymphadenopathy:    He has no cervical adenopathy.  Neurological: He is alert and oriented to person, place, and time. He has normal reflexes. He displays normal reflexes. No cranial nerve deficit. He exhibits normal muscle tone.  Skin: Skin is warm and dry. No rash noted. He is not diaphoretic. No erythema.  Psychiatric: He has a normal mood and affect. His behavior is normal.         POC TESTING Office Visit on 06/24/2018  Component Date Value Ref Range Status  . Glucose 06/24/2018 87  65 - 99 mg/dL Final  . BUN 16/05/9603 14  8 - 27 mg/dL Final  . Creatinine, Ser 06/24/2018 1.06  0.76 - 1.27 mg/dL Final  . GFR calc non Af Amer 06/24/2018 74  >59 mL/min/1.73 Final  . GFR calc Af Amer 06/24/2018 86  >59 mL/min/1.73 Final  . BUN/Creatinine Ratio 06/24/2018 13  10 - 24 Final  . Sodium 06/24/2018 142  134 - 144 mmol/L Final  . Potassium 06/24/2018 3.7  3.5 - 5.2 mmol/L Final  . Chloride 06/24/2018 105  96 - 106 mmol/L Final  . CO2 06/24/2018 19* 20 - 29 mmol/L Final  . Calcium 06/24/2018 9.2  8.6 - 10.2 mg/dL Final  . Total Protein 06/24/2018 6.9  6.0 - 8.5 g/dL Final  . Albumin 54/04/8118  4.6  3.6 -  4.8 g/dL Final  . Globulin, Total 06/24/2018 2.3  1.5 - 4.5 g/dL Final  . Albumin/Globulin Ratio 06/24/2018 2.0  1.2 - 2.2 Final  . Bilirubin Total 06/24/2018 1.5* 0.0 - 1.2 mg/dL Final  . Alkaline Phosphatase 06/24/2018 104  39 - 117 IU/L Final  . AST 06/24/2018 28  0 - 40 IU/L Final  . ALT 06/24/2018 21  0 - 44 IU/L Final  . WBC 06/24/2018 3.8  3.4 - 10.8 x10E3/uL Final  . RBC 06/24/2018 5.78  4.14 - 5.80 x10E6/uL Final  . Hemoglobin 06/24/2018 17.0  13.0 - 17.7 g/dL Final  . Hematocrit 40/98/1191 51.3* 37.5 - 51.0 % Final  . MCV 06/24/2018 89  79 - 97 fL Final  . MCH 06/24/2018 29.4  26.6 - 33.0 pg Final  . MCHC 06/24/2018 33.1  31.5 - 35.7 g/dL Final  . RDW 47/82/9562 13.2  12.3 - 15.4 % Final  . Platelets 06/24/2018 62* 150 - 450 x10E3/uL Final  . Neutrophils 06/24/2018 70  Not Estab. % Final  . Lymphs 06/24/2018 20  Not Estab. % Final  . Monocytes 06/24/2018 7  Not Estab. % Final  . Eos 06/24/2018 3  Not Estab. % Final  . Basos 06/24/2018 0  Not Estab. % Final  . Neutrophils Absolute 06/24/2018 2.6  1.4 - 7.0 x10E3/uL Final  . Lymphocytes Absolute 06/24/2018 0.8  0.7 - 3.1 x10E3/uL Final  . Monocytes Absolute 06/24/2018 0.3  0.1 - 0.9 x10E3/uL Final  . EOS (ABSOLUTE) 06/24/2018 0.1  0.0 - 0.4 x10E3/uL Final  . Basophils Absolute 06/24/2018 0.0  0.0 - 0.2 x10E3/uL Final  . Immature Granulocytes 06/24/2018 0  Not Estab. % Final  . Immature Grans (Abs) 06/24/2018 0.0  0.0 - 0.1 x10E3/uL Final  . Hematology Comments: 06/24/2018 Note:   Final   Verified by microscopic examination.  . Cholesterol, Total 06/24/2018 144  100 - 199 mg/dL Final  . Triglycerides 06/24/2018 79  0 - 149 mg/dL Final  . HDL 13/03/6577 32* >39 mg/dL Final  . VLDL Cholesterol Cal 06/24/2018 16  5 - 40 mg/dL Final  . LDL Calculated 06/24/2018 96  0 - 99 mg/dL Final  . Chol/HDL Ratio 06/24/2018 4.5  0.0 - 5.0 ratio Final   Comment:                                   T. Chol/HDL Ratio                                              Men  Women                               1/2 Avg.Risk  3.4    3.3                                   Avg.Risk  5.0    4.4                                2X Avg.Risk  9.6    7.1  3X Avg.Risk 23.4   11.0   . Vit D, 25-Hydroxy 06/24/2018 34.7  30.0 - 100.0 ng/mL Final   Comment: Vitamin D deficiency has been defined by the Institute of Medicine and an Endocrine Society practice guideline as a level of serum 25-OH vitamin D less than 20 ng/mL (1,2). The Endocrine Society went on to further define vitamin D insufficiency as a level between 21 and 29 ng/mL (2). 1. IOM (Institute of Medicine). 2010. Dietary reference    intakes for calcium and D. [place]: The    [employer]. 2. Holick MF, Binkley Perry, Bischoff-Ferrari HA, et al.    Evaluation, treatment, and prevention of vitamin D    deficiency: an Endocrine Society clinical practice    guideline. JCEM. 2011 Jul; 96(7):1911-30.   Marland Kitchen INR 06/24/2018 1.2  0.8 - 1.2 Final   Comment: Reference interval is for non-anticoagulated patients. Suggested INR therapeutic range for Vitamin K antagonist therapy:    Standard Dose (moderate intensity                   therapeutic range):       2.0 - 3.0    Higher intensity therapeutic range       2.5 - 3.5   . Prothrombin Time 06/24/2018 12.3* 9.1 - 12.0 sec Final  . aPTT 06/24/2018 32  24 - 33 sec Final   Comment: This test has not been validated for monitoring unfractionated heparin therapy. aPTT-based therapeutic ranges for unfractionated heparin therapy have not been established. For general guidelines on Heparin monitoring, refer to the [employer].      Assessment & Plan:   1. Annual physical exam   2. Screening for colorectal cancer   3. Essential hypertension   4. Polyp of colon, unspecified part of colon, unspecified type   5. Cirrhosis of liver without ascites, unspecified hepatic cirrhosis type (HCC)   6.  Thrombocytopenia (HCC)   7. Iron deficiency   8. Encounter for long-term current use of medication   9. Mild persistent asthma with acute exacerbation     Patient will continue on current chronic medications other than changes noted above, so ok to refill when needed.   Reviewed all health maintenance recommendations per USPSTF guidelines.   See after visit summary for patient specific instructions.  Orders Placed This Encounter  Procedures  . US ABDOMEN COMPLETE W/ELASTOGRAPHY    [employer] Epic 215 lbs No needs Mr/Dwain NPO Midnight/ 301 loc    Standing Status:   Future    Number of Occurrences:   1    Standing Expiration Date:   08/25/2019    Order Specific Question:   Reason for Exam (SYMPTOM  OR DIAGNOSIS REQUIRED)    Answer:   f/u cirrhosis    Order Specific Question:   Preferred imaging location?    Answer:   [facility]  . Hepatitis A vaccine adult IM  . Pneumococcal conjugate vaccine 13-valent IM  . Flu Vaccine QUAD 36+ mos IM  . Comprehensive metabolic panel    Order Specific Question:   Has the patient fasted?    Answer:   Yes  . CBC with Differential/Platelet  . Lipid panel    Order Specific Question:   Has the patient fasted?    Answer:   Yes  . VITAMIN D 25 Hydroxy (Vit-D Deficiency, Fractures)  . Protime-INR  . APTT  . Ambulatory referral to Gastroenterology    Referral Priority:   Routine  Referral Type:   Consultation    Referral Reason:   Specialty Services Required    Referred to Provider:   Laurell Roof, MD    Number of Visits Requested:   1    Meds ordered this encounter  Medications  . albuterol (PROVENTIL) (2.5 MG/3ML) 0.083% nebulizer solution 2.5 mg  . Zoster Vaccine Adjuvanted [facility]) injection    Sig: Inject 0.5 mLs into the muscle once for 1 dose. Repeat once in 2-6 months    Dispense:  0.5 mL    Refill:  1  . budesonide-formoterol (SYMBICORT) 160-4.5 MCG/ACT inhaler    Sig: Inhale 2 puffs into the lungs 2 (two)  times daily.    Dispense:  10.2 g    Refill:  5  . furosemide (LASIX) 20 MG tablet    Sig: Take 1 tablet (20 mg total) by mouth 2 (two) times daily.    Dispense:  180 tablet    Refill:  1  . gabapentin (NEURONTIN) 100 MG capsule    Sig: Take 1 capsule (100 mg total) by mouth 3 (three) times daily. As needed for restless legs and pain    Dispense:  270 capsule    Refill:  1  . omeprazole (PRILOSEC) 20 MG capsule    Sig: Take 1 tab po qd    Dispense:  90 capsule    Refill:  3  . gabapentin (NEURONTIN) 300 MG capsule    Sig: Take 1 capsule (300 mg total) by mouth at bedtime. For restless legs    Dispense:  90 capsule    Refill:  3  . rOPINIRole (REQUIP) 2 MG tablet    Sig: Take 2 tablets (4 mg total) by mouth at bedtime.    Dispense:  180 tablet    Refill:  1    Patient verbalized to me that they understand the following: diagnosis, what is being done for them, what to expect and what should be done at home.  Their questions have been answered. They understand that I am unable to predict every possible medication interaction or adverse outcome and that if any unexpected symptoms arise, they should contact us and their pharmacist, as well as never hesitate to seek urgent/emergent care at [facility] or ER if they think it might be warranted.    Norberto Sorenson, MD, MPH Primary Care at [facility] [address] [place], [place]  [phone] Office phone  [phone] Office fax   06/24/18 1:22 PM

## 2018-06-25 LAB — CBC WITH DIFFERENTIAL/PLATELET
BASOS ABS: 0 10*3/uL (ref 0.0–0.2)
Basos: 0 %
EOS (ABSOLUTE): 0.1 10*3/uL (ref 0.0–0.4)
Eos: 3 %
Hematocrit: 51.3 % — ABNORMAL HIGH (ref 37.5–51.0)
Hemoglobin: 17 g/dL (ref 13.0–17.7)
Immature Grans (Abs): 0 10*3/uL (ref 0.0–0.1)
Immature Granulocytes: 0 %
LYMPHS ABS: 0.8 10*3/uL (ref 0.7–3.1)
Lymphs: 20 %
MCH: 29.4 pg (ref 26.6–33.0)
MCHC: 33.1 g/dL (ref 31.5–35.7)
MCV: 89 fL (ref 79–97)
MONOS ABS: 0.3 10*3/uL (ref 0.1–0.9)
Monocytes: 7 %
Neutrophils Absolute: 2.6 10*3/uL (ref 1.4–7.0)
Neutrophils: 70 %
PLATELETS: 62 10*3/uL — AB (ref 150–450)
RBC: 5.78 x10E6/uL (ref 4.14–5.80)
RDW: 13.2 % (ref 12.3–15.4)
WBC: 3.8 10*3/uL (ref 3.4–10.8)

## 2018-06-25 LAB — VITAMIN D 25 HYDROXY (VIT D DEFICIENCY, FRACTURES): VIT D 25 HYDROXY: 34.7 ng/mL (ref 30.0–100.0)

## 2018-06-25 LAB — LIPID PANEL
Chol/HDL Ratio: 4.5 ratio (ref 0.0–5.0)
Cholesterol, Total: 144 mg/dL (ref 100–199)
HDL: 32 mg/dL — ABNORMAL LOW (ref >39)
LDL Calculated: 96 mg/dL (ref 0–99)
Triglycerides: 79 mg/dL (ref 0–149)
VLDL Cholesterol Cal: 16 mg/dL (ref 5–40)

## 2018-06-25 LAB — COMPREHENSIVE METABOLIC PANEL
A/G RATIO: 2 (ref 1.2–2.2)
ALT: 21 IU/L (ref 0–44)
AST: 28 IU/L (ref 0–40)
Albumin: 4.6 g/dL (ref 3.6–4.8)
Alkaline Phosphatase: 104 IU/L (ref 39–117)
BUN/Creatinine Ratio: 13 (ref 10–24)
BUN: 14 mg/dL (ref 8–27)
Bilirubin Total: 1.5 mg/dL — ABNORMAL HIGH (ref 0.0–1.2)
CO2: 19 mmol/L — AB (ref 20–29)
CREATININE: 1.06 mg/dL (ref 0.76–1.27)
Calcium: 9.2 mg/dL (ref 8.6–10.2)
Chloride: 105 mmol/L (ref 96–106)
GFR, EST AFRICAN AMERICAN: 86 mL/min/{1.73_m2} (ref >59)
GFR, EST NON AFRICAN AMERICAN: 74 mL/min/{1.73_m2} (ref >59)
GLOBULIN, TOTAL: 2.3 g/dL (ref 1.5–4.5)
Glucose: 87 mg/dL (ref 65–99)
POTASSIUM: 3.7 mmol/L (ref 3.5–5.2)
SODIUM: 142 mmol/L (ref 134–144)
TOTAL PROTEIN: 6.9 g/dL (ref 6.0–8.5)

## 2018-06-25 LAB — PROTIME-INR
INR: 1.2 (ref 0.8–1.2)
Prothrombin Time: 12.3 s — ABNORMAL HIGH (ref 9.1–12.0)

## 2018-06-25 LAB — APTT: aPTT: 32 s (ref 24–33)

## 2018-06-25 NOTE — Telephone Encounter (Signed)
Requested Prescriptions  Pending Prescriptions Disp Refills  . furosemide (LASIX) 40 MG tablet [Pharmacy Med Name: FUROSEMIDE 40MG  TABLETS] 90 tablet 0    Sig: TAKE 1 TABLET(40 MG) BY MOUTH DAILY     Cardiovascular:  Diuretics - Loop Passed - 06/24/2018  3:37 AM      Passed - K in normal range and within 360 days    Potassium  Date Value Ref Range Status  06/24/2018 WILL FOLLOW  Preliminary         Passed - Ca in normal range and within 360 days    Calcium  Date Value Ref Range Status  06/24/2018 WILL FOLLOW  Preliminary         Passed - Na in normal range and within 360 days    Sodium  Date Value Ref Range Status  06/24/2018 WILL FOLLOW  Preliminary         Passed - Cr in normal range and within 360 days    Creat  Date Value Ref Range Status  01/22/2016 1.04 0.70 - 1.25 mg/dL Final    Comment:      For patients > or = 63 years of age: The upper reference limit for Creatinine is approximately 13% higher for people identified as African-American.      Creatinine, Ser  Date Value Ref Range Status  06/24/2018 WILL FOLLOW  Preliminary         Passed - Last BP in normal range    BP Readings from Last 1 Encounters:  06/24/18 131/75         Passed - Valid encounter within last 6 months    Recent Outpatient Visits          Yesterday Annual physical exam   [facility], Levell JulyEva N, MD   2 months ago Hypotension due to drugs   [facility], Sandria Balesavid H, MD   4 months ago Orthostatic hypotension   [facility], Levell JulyEva N, MD   6 months ago Hepatic cirrhosis, unspecified hepatic cirrhosis type, unspecified whether ascites present [facility](HCC)   [facility] at [facility], Levell JulyEva N, MD   1 year ago Cirrhosis of liver without ascites, unspecified hepatic cirrhosis type [facility](HCC)   [facility] at [facility], Levell JulyEva N, MD      Future Appointments            In 3 months Sherren MochaShaw, Eva N, MD [facility]

## 2018-06-28 ENCOUNTER — Ambulatory Visit
Admission: RE | Admit: 2018-06-28 | Discharge: 2018-06-28 | Disposition: A | Discharge status: Home / Self Care | Payer: Managed Care, Other (non HMO) | Source: Ambulatory Visit | Attending: Family Medicine | Admitting: Family Medicine

## 2018-06-28 DIAGNOSIS — K7460 Unspecified cirrhosis of liver: Secondary | ICD-10-CM

## 2018-07-19 ENCOUNTER — Telehealth: Payer: Self-pay | Admitting: Family Medicine

## 2018-07-19 NOTE — Telephone Encounter (Signed)
Copied from CRM [id number]. Topic: General - Inquiry >> Jul 19, 2018  1:35 PM Angela NevinWilliams, Candice N wrote: Reason for CRM: Patient called to inquire if Dr. Clelia CroftShaw had sent out his letter of recommendation- as requested at last OV on 11/18. Patient would not disclose any information regarding letter, stating it is confidential and Dr. Clelia CroftShaw would know what letter he is inquiring about. Please advise.

## 2018-09-04 ENCOUNTER — Encounter: Payer: Self-pay | Admitting: Emergency Medicine

## 2018-09-04 ENCOUNTER — Ambulatory Visit (INDEPENDENT_AMBULATORY_CARE_PROVIDER_SITE_OTHER): Payer: Managed Care, Other (non HMO) | Admitting: Emergency Medicine

## 2018-09-04 VITALS — BP 97/62 | HR 86 | Temp 98.6°F | Resp 16 | Ht 77.0 in | Wt 213.0 lb

## 2018-09-04 DIAGNOSIS — L089 Local infection of the skin and subcutaneous tissue, unspecified: Secondary | ICD-10-CM

## 2018-09-04 NOTE — Patient Instructions (Addendum)
  Go to emergency room now for further evaluation and treatment.   If you have lab work done today you will be contacted with your lab results within the next 2 weeks.  If you have not heard from [place] then please contact us. The fastest way to get your results is to register for My Chart.   IF you received an x-ray today, you will receive an invoice from [facility]. Please contact [facility] at [phone] with questions or concerns regarding your invoice.   IF you received labwork today, you will receive an invoice from [facility]. Please contact [facility] at [phone] with questions or concerns regarding your invoice.   Our billing staff will not be able to assist you with questions regarding bills from these companies.  You will be contacted with the lab results as soon as they are available. The fastest way to get your results is to activate your My Chart account. Instructions are located on the last page of this paperwork. If you have not heard from [place] regarding the results in 2 weeks, please contact this office.

## 2018-09-04 NOTE — Progress Notes (Addendum)
Ronald [place] Ronald Zhang 63 y.o.   Chief Complaint  Patient presents with  . Toe Pain    right big toe is swollen and looks infected/ x 2 days    HISTORY OF PRESENT ILLNESS: This is a 63 y.o. male complaining of pain and swelling to big right toe that started 1 week ago.  No history of diabetes.  Had fever last night and this morning.  Denies trauma.  HPI   Prior to Admission medications   Medication Sig Start Date End Date Taking? Authorizing Provider  budesonide-formoterol (SYMBICORT) 160-4.5 MCG/ACT inhaler Inhale 2 puffs into the lungs 2 (two) times daily. 06/24/18  Yes Sherren MochaShaw, Eva N, MD  Cholecalciferol (VITAMIN D3) 1000 units CAPS Take 1,000 capsules by mouth daily.   Yes [provider]  furosemide (LASIX) 20 MG tablet Take 1 tablet (20 mg total) by mouth 2 (two) times daily. 06/24/18  Yes Sherren MochaShaw, Eva N, MD  furosemide (LASIX) 40 MG tablet TAKE 1 TABLET(40 MG) BY MOUTH DAILY 06/25/18  Yes Sherren MochaShaw, Eva N, MD  gabapentin (NEURONTIN) 100 MG capsule Take 1 capsule (100 mg total) by mouth 3 (three) times daily. As needed for restless legs and pain 06/24/18  Yes Sherren MochaShaw, Eva N, MD  gabapentin (NEURONTIN) 300 MG capsule Take 1 capsule (300 mg total) by mouth at bedtime. For restless legs 06/24/18  Yes Sherren MochaShaw, Eva N, MD  loratadine (CLARITIN) 10 MG tablet Take 10 mg by mouth daily.   Yes [provider]  Omega-3 Fatty Acids (OMEGA-3 FISH OIL PO) Take 1 capsule by mouth daily. 10/05/17  Yes [provider]  omeprazole (PRILOSEC) 20 MG capsule Take 1 tab po qd 06/24/18  Yes Sherren MochaShaw, Eva N, MD  rOPINIRole (REQUIP) 2 MG tablet Take 2 tablets (4 mg total) by mouth at bedtime. 06/24/18  Yes Sherren MochaShaw, Eva N, MD  spironolactone (ALDACTONE) 50 MG tablet Take 1 tablet (50 mg total) by mouth daily. 02/04/18  Yes Sherren MochaShaw, Eva N, MD  traZODone (DESYREL) 50 MG tablet Take 2 tablets (100 mg total) by mouth at bedtime. 02/04/18  Yes Sherren MochaShaw, Eva N, MD    Allergies  Allergen Reactions  . Penicillins Swelling      Childhood  . Acetaminophen Other (See Comments)    Headache from Tylenol    Patient Active Problem List   Diagnosis Date Noted  . Low HDL (under 40) 02/06/2018  . Iron deficiency 04/11/2017  . Thrombocytopenia (HCC) 03/20/2017  . Hepatic cirrhosis (HCC) 03/21/2016  . Non-rheumatic mitral regurgitation   . Erosive gastritis 03/30/2015  . Colon polyps 03/30/2015  . Non-rheumatic tricuspid valve insufficiency 01/26/2015  . Restless leg 10/08/2014  . H/O alcohol abuse 10/08/2014  . Erectile dysfunction 10/08/2014  . Insomnia 10/08/2014  . Pancreatic cyst 10/08/2014  . HTN (hypertension) 10/15/2011    Past Medical History:  Diagnosis Date  . Alcohol dependency (HCC)    stopped drinking since May 07, 2013  . Allergy   . Anemia    iron deficiency, non compliant with iron  . Arthritis    bilateral knees,secondary to trauma  . Cellulitis and abscess of leg    Hospitalized at [facility] from 4/5-4/10/14 for bilateral LE edema and cellulitis. Was on IV Lasix and Vancomycin.   . Erectile dysfunction   . H/O non anemic vitamin B12 deficiency 2014  . Hypertension   . Insomnia    3rd shift   . Non-rheumatic mitral regurgitation    Asymptomatic, Seen by cardiology 01/26/15, [facility] Norman Herr[facility] Munley[facility]   . Pancreatic cyst    Multiple, MRI of abd and pelvis in 12/2013  , ? sequelae of chronic pancreatitis , he has a hx of alcoholism, suggested repeat MRI in 6 months fro progression, appears nonmalignant  . Pulmonary HTN (HCC)    TEE on March 31,2014-mild-mod concentric hypertrophy.  EF 60-65%. Left atrial valve mildly dilated, right atrium mildly dilated, mild-moderate MR, mild TR. Pum Pressure 51 mm Hg  . Restless leg   . Urinary incontinence     Past Surgical History:  Procedure Laterality Date  . FRACTURE SURGERY Bilateral 1972   below knee  . HEMORRHOID SURGERY    . 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 Loralie Champagneurrani    Social History    Socioeconomic History  . Marital status: Divorced    Spouse name: n/a  . Number of children: 2  . Years of education: 12th grade  . Highest education level: Not on file  Occupational History  . Occupation: Psychiatristbattery plant  Social Needs  . Financial resource strain: Not on file  . Food insecurity:    Worry: Not on file    Inability: Not on file  . Transportation needs:    Medical: Not on file    Non-medical: Not on file  Tobacco Use  . Smoking status: Never Smoker  . Smokeless tobacco: Never Used  Substance and Sexual Activity  . Alcohol use: No    Alcohol/week: 0.0 standard drinks    Comment: history of a pint a day, sober since 05/07/2013  . Drug use: No  . Sexual activity: Never    Birth control/protection: Abstinence  Lifestyle  . Physical activity:    Days per week: Not on file    Minutes per session: Not on file  . Stress: Not on file  Relationships  . Social connections:    Talks on phone: Not on file    Gets together: Not on file    Attends religious service: Not on file    Active member of club or organization: Not on file    Attends meetings of clubs or organizations: Not on file    Relationship status: Not on file  . Intimate partner violence:    Fear of current or ex partner: Not on file    Emotionally abused: Not on file    Physically abused: Not on file    Forced sexual activity: Not on file  Other Topics Concern  . Not on file  Social History Narrative   05/07/2013 AHW "[name]" was born and [place]. He has 3 younger brothers. His parents are still together. He reports he had a good childhood. He graduated from high school. 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 He married once, and has been divorced for 30 years. He has twins, a boy and girl. He denies any legal difficulties. His hobbies include family genealogy, studying Southern history, and participating in Civil War reenactments. 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 His social support system consists of friends. 05/07/2013 AHW    Family History  Problem Relation Age of Onset  . Hypertension Mother   . Hypertension Father   . Alcohol abuse Brother      Review of Systems  Constitutional: Positive for fever.  Respiratory: Negative for cough and shortness of breath.   Cardiovascular: Negative for chest pain.  Gastrointestinal: Negative for nausea and vomiting.  Musculoskeletal:       Big  toe pain  Neurological: Negative.  Negative for dizziness and headaches.  Endo/Heme/Allergies: Negative.     Vitals:   09/04/18 1113  BP: 97/62  Pulse: 86  Resp: 16  Temp: 98.6 F (37 C)  SpO2: 96%    Physical Exam Vitals signs reviewed.  Constitutional:      Appearance: Normal appearance.  HENT:     Head: Normocephalic.  Eyes:     Extraocular Movements: Extraocular movements intact.     Pupils: Pupils are equal, round, and reactive to light.  Neck:     Musculoskeletal: Normal range of motion.  Cardiovascular:     Rate and Rhythm: Normal rate and regular rhythm.     Heart sounds: Normal heart sounds.  Pulmonary:     Effort: Pulmonary effort is normal.     Breath sounds: Normal breath sounds.  Musculoskeletal: Normal range of motion.     Comments: Right big toe: Swollen with erythema and yellowish distal discoloration with fluctuation.  Necrotic tissue on the nail as well as distal portion.  See picture below.  Skin:    General: Skin is warm and dry.     Capillary Refill: Capillary refill takes less than 2 seconds.  Neurological:     General: No focal deficit present.     Mental Status: He is alert and oriented to person, place, and time.  Psychiatric:        Mood and Affect: Mood normal.        Behavior: Behavior normal.          ASSESSMENT & PLAN: Hillard was seen today for toe pain.  Diagnoses and all orders for this visit:  Toe infection Comments: Suspected osteomyelitis   Patient Instructions    Go to  emergency room now for further evaluation and treatment.   If you have lab work done today you will be contacted with your lab results within the next 2 weeks.  If you have not heard from [place] then please contact us. The fastest way to get your results is to register for My Chart.   IF you received an x-ray today, you will receive an invoice from [facility]. Please contact [facility] at [phone] with questions or concerns regarding your invoice.   IF you received labwork today, you will receive an invoice from [facility]. Please contact [facility] at [phone] with questions or concerns regarding your invoice.   Our billing staff will not be able to assist you with questions regarding bills from these companies.  You will be contacted with the lab results as soon as they are available. The fastest way to get your results is to activate your My Chart account. Instructions are located on the last page of this paperwork. If you have not heard from [place] regarding the results in 2 weeks, please contact this office.          Edwina Barth, MD [facility]

## 2018-09-05 ENCOUNTER — Encounter: Payer: Self-pay | Admitting: Sports Medicine

## 2018-09-05 ENCOUNTER — Ambulatory Visit (INDEPENDENT_AMBULATORY_CARE_PROVIDER_SITE_OTHER): Payer: Managed Care, Other (non HMO) | Admitting: Sports Medicine

## 2018-09-05 VITALS — BP 103/63 | HR 73 | Resp 16 | Ht 77.0 in | Wt 213.0 lb

## 2018-09-05 DIAGNOSIS — L608 Other nail disorders: Secondary | ICD-10-CM

## 2018-09-05 DIAGNOSIS — M79674 Pain in right toe(s): Secondary | ICD-10-CM

## 2018-09-05 DIAGNOSIS — L02611 Cutaneous abscess of right foot: Secondary | ICD-10-CM

## 2018-09-05 DIAGNOSIS — L03031 Cellulitis of right toe: Secondary | ICD-10-CM

## 2018-09-05 NOTE — Progress Notes (Signed)
Subjective: Ronald Zhang is a 63 y.o. male patient presents to office today complaining of a bleeding right great toenail x1 week.  Patient states that he thinks his steel toe boots put pressure to the toe and caused redness and swelling on yesterday was seen at [facility] ER where the physician assistant Micah Noel the purulent area at the medial aspect of his right great toe and gave him oral antibiotic and instructed him to use Epson salt and Betadine.  Patient denies nausea and vomiting but does state that he did have fever and chills on Wednesday. Patient denies any other related constitutional symptoms at this time.  Review of Systems  Constitutional: Positive for chills and fever.  All other systems reviewed and are negative.    Patient Active Problem List   Diagnosis Date Noted  . Low HDL (under 40) 02/06/2018  . Iron deficiency 04/11/2017  . Thrombocytopenia (HCC) 03/20/2017  . Hepatic cirrhosis (HCC) 03/21/2016  . Non-rheumatic mitral regurgitation   . Erosive gastritis 03/30/2015  . Colon polyps 03/30/2015  . Non-rheumatic tricuspid valve insufficiency 01/26/2015  . Restless leg 10/08/2014  . H/O alcohol abuse 10/08/2014  . Erectile dysfunction 10/08/2014  . Insomnia 10/08/2014  . Pancreatic cyst 10/08/2014  . HTN (hypertension) 10/15/2011    Current Outpatient Medications on File Prior to Visit  Medication Sig Dispense Refill  . budesonide-formoterol (SYMBICORT) 160-4.5 MCG/ACT inhaler Inhale 2 puffs into the lungs 2 (two) times daily. 10.2 g 5  . Cholecalciferol (VITAMIN D3) 1000 units CAPS Take 1,000 capsules by mouth daily.    . furosemide (LASIX) 20 MG tablet Take 1 tablet (20 mg total) by mouth 2 (two) times daily. 180 tablet 1  . furosemide (LASIX) 40 MG tablet TAKE 1 TABLET(40 MG) BY MOUTH DAILY 90 tablet 0  . gabapentin (NEURONTIN) 100 MG capsule Take 1 capsule (100 mg total) by mouth 3 (three) times daily. As needed for restless legs and pain 270 capsule 1  .  gabapentin (NEURONTIN) 300 MG capsule Take 1 capsule (300 mg total) by mouth at bedtime. For restless legs 90 capsule 3  . loratadine (CLARITIN) 10 MG tablet Take 10 mg by mouth daily.    . Omega-3 Fatty Acids (OMEGA-3 FISH OIL PO) Take 1 capsule by mouth daily.    Marland Kitchen omeprazole (PRILOSEC) 20 MG capsule Take 1 tab po qd 90 capsule 3  . rOPINIRole (REQUIP) 2 MG tablet Take 2 tablets (4 mg total) by mouth at bedtime. 180 tablet 1  . spironolactone (ALDACTONE) 50 MG tablet Take 1 tablet (50 mg total) by mouth daily. 90 tablet 1  . traZODone (DESYREL) 50 MG tablet Take 2 tablets (100 mg total) by mouth at bedtime. 180 tablet 3   Current Facility-Administered Medications on File Prior to Visit  Medication Dose Route Frequency Provider Last Rate Last Dose  . albuterol (PROVENTIL) (2.5 MG/3ML) 0.083% nebulizer solution 2.5 mg  2.5 mg Nebulization Once Sherren Mocha, MD        Allergies  Allergen Reactions  . Penicillins Swelling    Childhood  . Acetaminophen Other (See Comments)    Headache from Tylenol    Objective:  Vitals:   09/05/18 1118  Weight: 213 lb (96.6 kg)  Height: 6\' 5"  (1.956 m)    General: Well developed, nourished, in no acute distress, alert and oriented x3   Dermatology: Skin is warm, dry and supple bilateral.  Right hallux nail appears to be  severely thickened distally with mild medial incurvation and  significant hematoma encompassing greater than 25% of the medial aspect of the great toenail. (+) Blanchable erythema. (+) Edema. (-) serosanguous  drainage present.  Small puncture wound at the medial aspect of the right great toe at area where it was lanced yesterday in ER by the physician assistant.  The remaining nails appear unremarkable at this time. There are no open sores, lesions or other signs of infection  present.  Vascular: Dorsalis Pedis artery and Posterior Tibial artery pedal pulses are 1/4 bilateral with immedate capillary fill time.  Scant pedal hair growth  present. No lower extremity edema.   Neruologic: Grossly intact via light touch bilateral.  Vibratory intact bilateral.  Musculoskeletal: No tenderness today but on yesterday extreme tenderness at the right hallux medial aspect before the abscess was drained by the physician assistant in the ER. Muscular strength within normal limits in all groups bilateral.   Assesement and Plan: Problem List Items Addressed This Visit    None    Visit Diagnoses    Nail hemorrhage    -  Primary   Great toe pain, right       Cellulitis and abscess of toe of right foot          -Discussed treatment alternatives and plan of care; Explained permanent/temporary nail avulsion and post procedure course to patient. Patient elects for removal of right great toenail. - After a verbal and written consent, injected 3 ml of a 50:50 mixture of 2% plain  lidocaine and 0.5% plain marcaine in a normal hallux block fashion. Next, a  betadine prep was performed. Anesthesia was tested and found to be appropriate.  The offending right great toenail completely was then incised from the hyponychium to the epinychium and was removed and cleared from the field. The area was curretted for any remaining nail or spicules then flushed with alcohol and dressed with antibiotic cream and a dry sterile dressing. -Patient was instructed to leave the dressing intact for today and begin soaking  in a weak solution of betadine or Epsom salt and water tomorrow. Patient was instructed to  soak for 15-20 minutes each day and apply Betadine and a gauze or bandaid dressing each day. -Continue with oral antibiotics as prescribed on yesterday from ER -Patient was instructed to monitor the toe for signs of infection and return to office if toe becomes red, hot or swollen. -Advised ice, elevation, and tylenol or motrin if needed for pain.  -Patient is to return in 2 weeks for follow up care/nail check or sooner if problems arise.  Asencion Islam,  DPM

## 2018-09-05 NOTE — Patient Instructions (Signed)

## 2018-09-18 ENCOUNTER — Ambulatory Visit: Payer: Managed Care, Other (non HMO) | Admitting: Sports Medicine

## 2018-09-18 ENCOUNTER — Encounter: Payer: Self-pay | Admitting: Sports Medicine

## 2018-09-18 ENCOUNTER — Ambulatory Visit (INDEPENDENT_AMBULATORY_CARE_PROVIDER_SITE_OTHER): Payer: Managed Care, Other (non HMO) | Admitting: Sports Medicine

## 2018-09-18 DIAGNOSIS — Z9889 Other specified postprocedural states: Secondary | ICD-10-CM

## 2018-09-18 DIAGNOSIS — M79674 Pain in right toe(s): Secondary | ICD-10-CM

## 2018-09-18 DIAGNOSIS — L84 Corns and callosities: Secondary | ICD-10-CM

## 2018-09-18 NOTE — Progress Notes (Signed)
Subjective: Ronald Zhang is a 63 y.o. male patient returns to office today for follow up evaluation after having Right Hallux total temporary nail avulsion performed on 09-05-18. Patient has been soaking using epsom salt and applying topical antibiotic covered with bandaid daily. Patient finished PO antibiotics.  Patient deniesfever/chills/nausea/vomitting/any other related constitutional symptoms at this time. Reports that he has a little pain over callus on side of left foot.   Patient Active Problem List   Diagnosis Date Noted  . Low HDL (under 40) 02/06/2018  . Iron deficiency 04/11/2017  . Thrombocytopenia (HCC) 03/20/2017  . Hepatic cirrhosis (HCC) 03/21/2016  . Non-rheumatic mitral regurgitation   . Erosive gastritis 03/30/2015  . Colon polyps 03/30/2015  . Non-rheumatic tricuspid valve insufficiency 01/26/2015  . Restless leg 10/08/2014  . H/O alcohol abuse 10/08/2014  . Erectile dysfunction 10/08/2014  . Insomnia 10/08/2014  . Pancreatic cyst 10/08/2014  . HTN (hypertension) 10/15/2011    Current Outpatient Medications on File Prior to Visit  Medication Sig Dispense Refill  . budesonide-formoterol (SYMBICORT) 160-4.5 MCG/ACT inhaler Inhale 2 puffs into the lungs 2 (two) times daily. 10.2 g 5  . Cholecalciferol (VITAMIN D3) 1000 units CAPS Take 1,000 capsules by mouth daily.    . furosemide (LASIX) 20 MG tablet Take 1 tablet (20 mg total) by mouth 2 (two) times daily. 180 tablet 1  . furosemide (LASIX) 40 MG tablet TAKE 1 TABLET(40 MG) BY MOUTH DAILY 90 tablet 0  . gabapentin (NEURONTIN) 100 MG capsule Take 1 capsule (100 mg total) by mouth 3 (three) times daily. As needed for restless legs and pain 270 capsule 1  . gabapentin (NEURONTIN) 300 MG capsule Take 1 capsule (300 mg total) by mouth at bedtime. For restless legs 90 capsule 3  . loratadine (CLARITIN) 10 MG tablet Take 10 mg by mouth daily.    . Omega-3 Fatty Acids (OMEGA-3 FISH OIL PO) Take 1 capsule by mouth  daily.    Marland Kitchen omeprazole (PRILOSEC) 20 MG capsule Take 1 tab po qd 90 capsule 3  . rOPINIRole (REQUIP) 2 MG tablet Take 2 tablets (4 mg total) by mouth at bedtime. 180 tablet 1  . spironolactone (ALDACTONE) 50 MG tablet Take 1 tablet (50 mg total) by mouth daily. 90 tablet 1  . traZODone (DESYREL) 50 MG tablet Take 2 tablets (100 mg total) by mouth at bedtime. 180 tablet 3   Current Facility-Administered Medications on File Prior to Visit  Medication Dose Route Frequency Provider Last Rate Last Dose  . albuterol (PROVENTIL) (2.5 MG/3ML) 0.083% nebulizer solution 2.5 mg  2.5 mg Nebulization Once Sherren Mocha, MD        Allergies  Allergen Reactions  . Penicillins Swelling    Childhood  . Acetaminophen Other (See Comments)    Headache from Tylenol    Objective:  General: Well developed, nourished, in no acute distress, alert and oriented x3   Dermatology: Skin is warm, dry and supple bilateral. Right hallux nail bed appears to be clean, dry, with mild granular tissue and surrounding eschar/scab. (-) Erythema. (-) Edema. (-) serosanguous drainage present. The remaining nails appear unremarkable at this time. To lateral foot at styloid process on left there is a callus. There are no other lesions or other signs of infection  present.  Neurovascular status: Intact. No lower extremity swelling; No pain with calf compression bilateral.  Musculoskeletal: Decreased tenderness to palpation of the R hallux. Muscular strength within normal limits bilateral. Varus foot type with increased lateral load  L>R.  Assesement and Plan: Problem List Items Addressed This Visit    None    Visit Diagnoses    S/P nail surgery    -  Primary   Great toe pain, right       Callus of foot           -Examined patient  -Cleansed right hallux nail bed and gently scrubbed with peroxide and q-tip/curetted away eschar at site and applied antibiotic cream covered with bandaid.  -Discussed plan of care with  patient. -Patient to now begin soaking in a weak solution of Epsom salt and warm water. Patient was instructed to soak for 15-20 minutes each day until the toe appears normal and there is no drainage, redness, tenderness, or swelling at the procedure site, and apply neosporin and a gauze or bandaid dressing each day as needed. May leave open to air at night. -Educated patient on long term care after nail surgery. -Patient was instructed to monitor the toe for reoccurrence and signs of infection; Patient advised to return to office or go to ER if toe becomes red, hot or swollen. -At no charge trimmed callus at left lateral foot using sterile chisel blade without incident and applied offloading padding to shoe and advised patient to get over-the-counter offloading insoles -Patient is to return as needed or sooner if problems arise.  Asencion Islam, DPM

## 2018-09-20 ENCOUNTER — Other Ambulatory Visit: Payer: Self-pay | Admitting: Family Medicine

## 2018-10-01 IMAGING — DX DG CHEST 2V
2 series · 3 of 3 positions shown · non-contrast
Comparison: 10/27/2016.

CLINICAL DATA: Worsening shortness of breath and wheezing.

EXAM:
CHEST  2 VIEW

[Series 1: chest pa · 0.14mm/px · 2 of 2 slices shown]
[im 1/2]
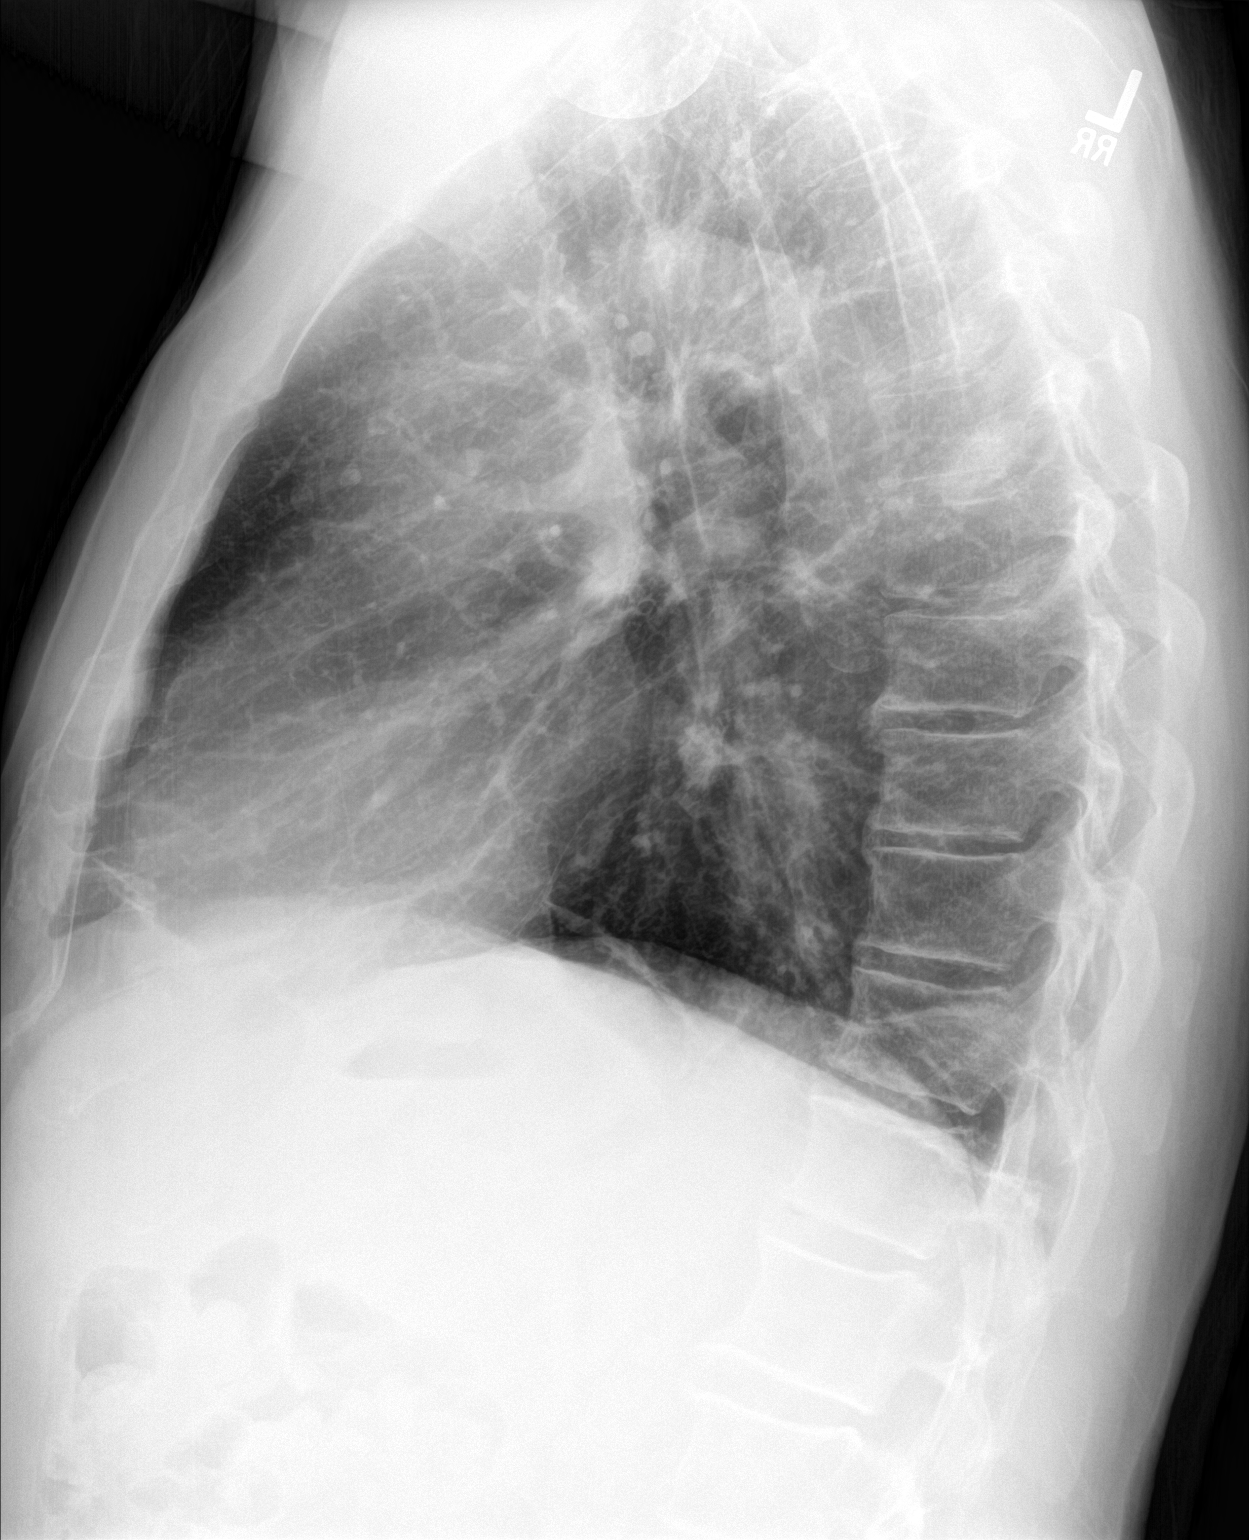
[im 2/2]
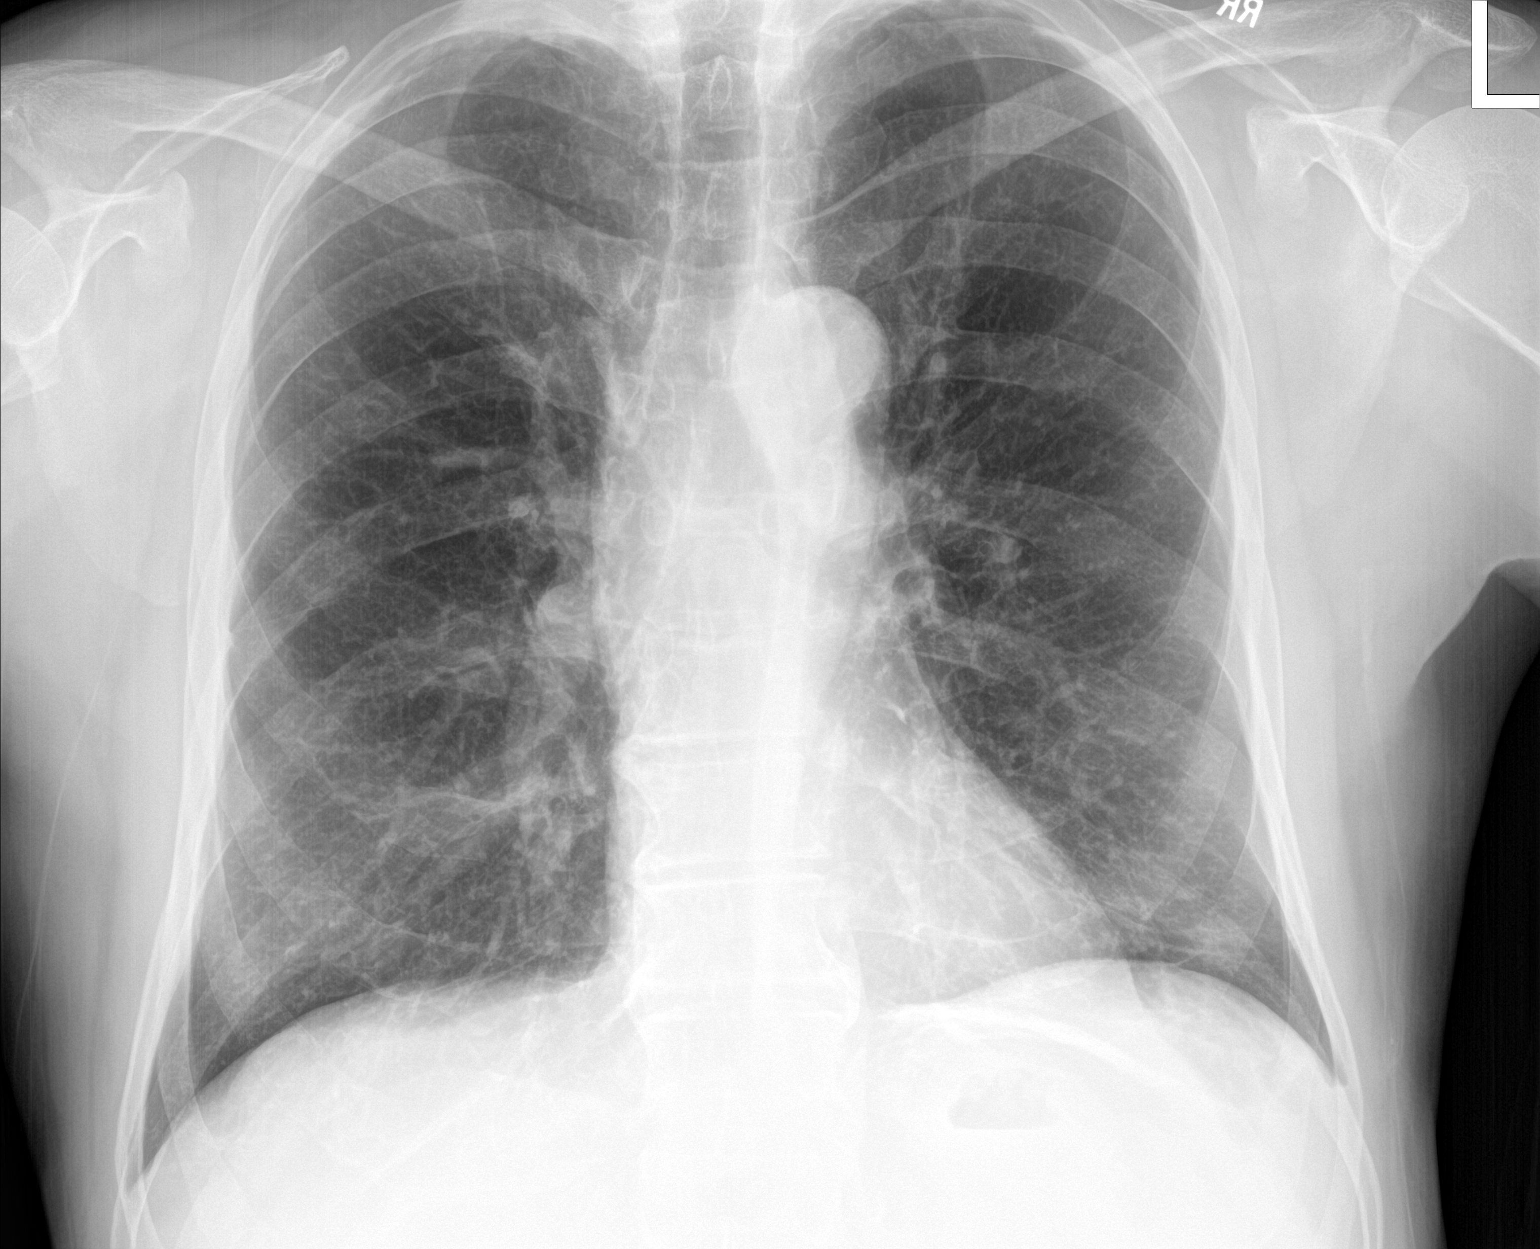

[chest lat]
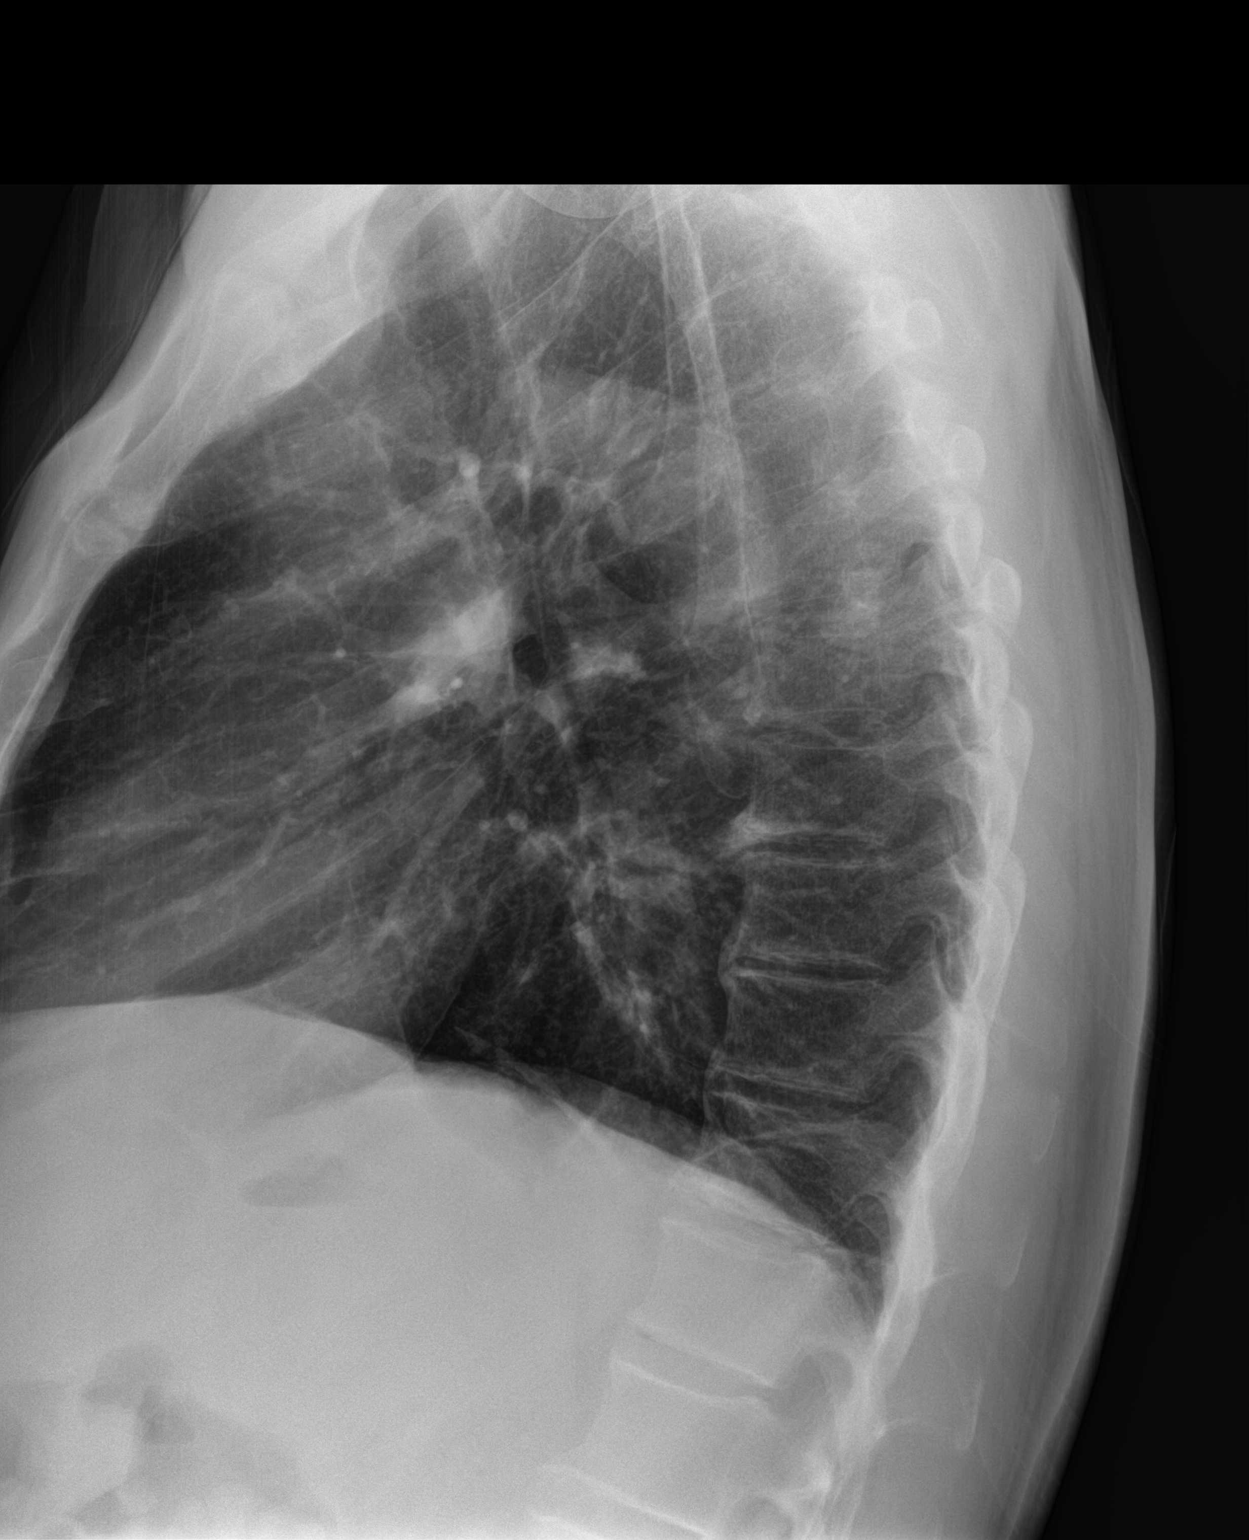

[3 of 3 positions shown; findings below may reference images not displayed]

FINDINGS: Normal sized heart. Tortuous aorta. The lungs remain hyperexpanded
with mild diffuse peribronchial thickening. There is an interval
x 1.4 cm oval density overlying the posterior aspect of the mid mid
thoracic spine on the lateral views. Interval mild linear density at
the lung bases on the lateral views. Thoracic spine degenerative
changes.
IMPRESSION: 1. **An incidental finding of potential clinical significance has
been found. Interval 3.0 x 1.4 cm oval density overlying the
posterior aspect of the midthoracic spine on the lateral views. This
is concerning for a possible lung neoplasm. Further evaluation with
a chest CT with contrast is recommended.**
2. Interval mild linear atelectasis or scarring at the lung bases.
3. Stable mild changes of COPD and chronic bronchitis.

These results will be called to the ordering clinician or
representative by the Radiologist Assistant, and communication
documented in the PACS or zVision Dashboard.

## 2018-10-07 ENCOUNTER — Telehealth: Payer: Self-pay | Admitting: Family Medicine

## 2018-10-07 NOTE — Telephone Encounter (Signed)
Tried to call patient to let them know Dr. Shaw is no longer at [facility] and that their appointment with her is going to be cancelled. ° °LVM for patient, if patient calls back, please try to get them rescheduled with a different provider or let them know that Dr Shaw is going to be working at [facility] the number there is [phone]. °

## 2018-10-22 ENCOUNTER — Ambulatory Visit: Payer: Managed Care, Other (non HMO) | Admitting: Family Medicine

## 2018-11-14 ENCOUNTER — Telehealth: Payer: Self-pay | Admitting: Family Medicine

## 2018-11-14 NOTE — Telephone Encounter (Signed)
Copied from CRM [id number]. Topic: General - Call Back - No Documentation >> Nov 14, 2018  1:01 PM Mickel Baas B, [place] wrote: Reason for CRM: Patient calling and states that he is returning a call to medical records. Please advise.

## 2018-11-14 NOTE — Telephone Encounter (Signed)
I have spoke with pt, got a verbal auth and mailed out his medical records to [employer].

## 2018-11-21 DIAGNOSIS — D61818 Other pancytopenia: Secondary | ICD-10-CM

## 2018-11-21 LAB — CBC AND DIFFERENTIAL
HCT: 47 (ref 41–53)
Hemoglobin: 15.8 (ref 13.5–17.5)
Neutrophils Absolute: 69
Platelets: 58 — AB (ref 150–399)
WBC: 4

## 2018-11-21 LAB — HEPATIC FUNCTION PANEL
ALT: 39 (ref 10–40)
AST: 34 (ref 14–40)
Alkaline Phosphatase: 76 (ref 25–125)
Bilirubin, Total: 1

## 2018-11-21 LAB — BASIC METABOLIC PANEL
Creatinine: 1 (ref 0.6–1.3)
Potassium: 4.6 (ref 3.4–5.3)
Sodium: 138 (ref 137–147)

## 2018-11-21 LAB — POCT INR: INR: 1.1 (ref 0.9–1.1)

## 2018-12-13 ENCOUNTER — Telehealth: Payer: Self-pay | Admitting: Family Medicine

## 2018-12-13 NOTE — Telephone Encounter (Signed)
Copied from CRM [id number]. Topic: Quick Communication - Rx Refill/Question >> Dec 13, 2018  9:14 AM [name] D wrote: Medication: spironolactone (ALDACTONE) 50 MG tablet  Has the patient contacted their pharmacy? Yes.   (Agent: If no, request that the patient contact the pharmacy for the refill.) (Agent: If yes, when and what did the pharmacy advise?)  Preferred Pharmacy (with phone number or street name): [facility] [phone] - [place], [place] - [address] AT [facility] [address] [phone] (Phone) [phone] (Fax)    Agent: Please be advised that RX refills may take up to 3 business days. We ask that you follow-up with your pharmacy.

## 2018-12-17 NOTE — Telephone Encounter (Signed)
Pt called back to check status on refill request. States that he is out. Made aware of 72 hour refill request review time. Plans to contact preferred pharmacy tomorrow to check status/availability.  Please advise

## 2018-12-18 ENCOUNTER — Other Ambulatory Visit: Payer: Self-pay

## 2018-12-18 MED ORDER — SPIRONOLACTONE 50 MG PO TABS: 50.0000 mg | ORAL_TABLET | Freq: Every day | ORAL | 0 refills | Status: DC

## 2018-12-18 NOTE — Telephone Encounter (Signed)
Sent in 30 day supply but needs to have tele med apt for refills.

## 2018-12-19 NOTE — Telephone Encounter (Signed)
Pt called to request 100mg  of spironolactone (ALDACTONE)  He stated that is what he normally takes. Please advise.

## 2018-12-20 NOTE — Telephone Encounter (Signed)
States they take 100mg  but on record we have 50mg . Please advise.

## 2019-01-01 ENCOUNTER — Other Ambulatory Visit: Payer: Self-pay | Admitting: Family Medicine

## 2019-01-01 NOTE — Telephone Encounter (Signed)
Copied from CRM [id number]. Topic: Quick Communication - Rx Refill/Question >> Jan 01, 2019 12:27 PM Deborha Payment wrote: Medication: spironolactone (ALDACTONE) 50 MG tablet   Has the patient contacted their pharmacy? Yes, no more refills.  Patient usually gets 100mg .  Patient states that shaw cannot refill medications right now? So that's why he has still been trying to contact us.    Preferred Pharmacy (with phone number or street name): [facility] [phone] - [place], [place] - [address] AT [facility] OF [address] & [address] [phone] (Phone) [phone] (Fax)    Agent: Please be advised that RX refills may take up to 3 business days. We ask that you follow-up with your pharmacy.

## 2019-01-02 ENCOUNTER — Telehealth: Payer: Self-pay | Admitting: Family Medicine

## 2019-01-02 NOTE — Telephone Encounter (Signed)
Called pt LVM to call us back to schedule appt for refills FR

## 2019-01-02 NOTE — Telephone Encounter (Signed)
Called pt per CRM to set up appt for refills on his meds before he runs out again LVM for pt FR

## 2019-01-03 ENCOUNTER — Other Ambulatory Visit: Payer: Self-pay | Admitting: Family Medicine

## 2019-01-03 MED ORDER — SPIRONOLACTONE 50 MG PO TABS: 50.0000 mg | ORAL_TABLET | Freq: Every day | ORAL | 0 refills | Status: DC

## 2019-01-03 NOTE — Telephone Encounter (Signed)
I have called the pt and informed him this is going to be the last Courtesy refill that he is able to get from [place] until he makes an appointment. He stated understanding and has an appointment coming up with Dr. Clelia Croft

## 2019-01-03 NOTE — Telephone Encounter (Signed)
Requested medication (s) are due for refill today: No  Requested medication (s) are on the active medication list: yes  Last refill:  01/02/2019  Future visit scheduled: No  Notes to clinic:  Pt needs appointment for refills    Requested Prescriptions  Pending Prescriptions Disp Refills   spironolactone (ALDACTONE) 50 MG tablet [Pharmacy Med Name: SPIRONOLACTONE 50MG  TABLETS] 90 tablet     Sig: TAKE 1 TABLET(50 MG) BY MOUTH DAILY     Cardiovascular: Diuretics - Aldosterone Antagonist Failed - 01/03/2019  3:03 PM      Failed - Valid encounter within last 6 months    Recent Outpatient Visits          4 months ago Toe infection   [facility] at [facility], Eilleen Kempf, MD   6 months ago Annual physical exam   [facility], Levell July, MD   8 months ago Hypotension due to drugs   [facility] at [facility], Sandria Bales, MD   11 months ago Orthostatic hypotension   [facility] at [facility], Levell July, MD   1 year ago Hepatic cirrhosis, unspecified hepatic cirrhosis type, unspecified whether ascites present [facility])   [facility] at [facility], Levell July, MD             Passed - Cr in normal range and within 360 days    Creat  Date Value Ref Range Status  01/22/2016 1.04 0.70 - 1.25 mg/dL Final    Comment:      For patients > or = 64 years of age: The upper reference limit for Creatinine is approximately 13% higher for people identified as African-American.      Creatinine, Ser  Date Value Ref Range Status  06/24/2018 1.06 0.76 - 1.27 mg/dL Final         Passed - K in normal range and within 360 days    Potassium  Date Value Ref Range Status  06/24/2018 3.7 3.5 - 5.2 mmol/L Final         Passed - Na in normal range and within 360 days    Sodium  Date Value Ref Range Status  06/24/2018 142 134 - 144 mmol/L Final         Passed - Last BP in normal range    BP Readings from Last 1 Encounters:  09/05/18 103/63

## 2019-01-28 ENCOUNTER — Other Ambulatory Visit: Payer: Self-pay | Admitting: Gastroenterology

## 2019-01-28 DIAGNOSIS — Z1289 Encounter for screening for malignant neoplasm of other sites: Secondary | ICD-10-CM

## 2019-01-28 DIAGNOSIS — K7030 Alcoholic cirrhosis of liver without ascites: Secondary | ICD-10-CM

## 2019-01-30 ENCOUNTER — Ambulatory Visit
Admission: RE | Admit: 2019-01-30 | Discharge: 2019-01-30 | Disposition: A | Discharge status: Home / Self Care | Payer: Managed Care, Other (non HMO) | Source: Ambulatory Visit | Attending: Gastroenterology | Admitting: Gastroenterology

## 2019-01-30 ENCOUNTER — Other Ambulatory Visit: Payer: Self-pay | Admitting: Family Medicine

## 2019-01-30 DIAGNOSIS — K7030 Alcoholic cirrhosis of liver without ascites: Secondary | ICD-10-CM

## 2019-01-30 DIAGNOSIS — Z1289 Encounter for screening for malignant neoplasm of other sites: Secondary | ICD-10-CM

## 2019-01-30 NOTE — Telephone Encounter (Signed)
Forwarding medication refill to provider for review. 

## 2020-06-17 ENCOUNTER — Other Ambulatory Visit: Payer: Self-pay | Admitting: Family Medicine

## 2020-06-17 DIAGNOSIS — K7460 Unspecified cirrhosis of liver: Secondary | ICD-10-CM

## 2020-06-22 ENCOUNTER — Other Ambulatory Visit: Payer: Self-pay | Admitting: Family Medicine

## 2020-06-22 DIAGNOSIS — N6310 Unspecified lump in the right breast, unspecified quadrant: Secondary | ICD-10-CM

## 2020-07-28 ENCOUNTER — Other Ambulatory Visit: Payer: Self-pay

## 2020-07-28 ENCOUNTER — Ambulatory Visit
Admission: RE | Admit: 2020-07-28 | Discharge: 2020-07-28 | Disposition: A | Discharge status: Home / Self Care | Payer: Managed Care, Other (non HMO) | Source: Ambulatory Visit | Attending: Family Medicine | Admitting: Family Medicine

## 2020-07-28 ENCOUNTER — Ambulatory Visit: Payer: Managed Care, Other (non HMO)

## 2020-07-28 DIAGNOSIS — N6310 Unspecified lump in the right breast, unspecified quadrant: Secondary | ICD-10-CM

## 2021-10-27 ENCOUNTER — Other Ambulatory Visit: Payer: Self-pay | Admitting: Oncology

## 2021-10-27 DIAGNOSIS — D696 Thrombocytopenia, unspecified: Secondary | ICD-10-CM

## 2021-10-27 NOTE — Progress Notes (Incomplete)
?[facility]  ?[address] ?[place],  [place]  [address] ?[phone] ? ?Clinic Day:  10/27/2021 ? ?Referring physician: Sherren Mocha, MD ? ? ?HISTORY OF PRESENT ILLNESS:  ?The patient is a 66 y.o. 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  He comes in today for routine follow up.  Since his last visit, the patient has been doing fairly well.  He denies any subcutaneous bleeding/bruising issues which concern him for complications related to his underlying liver disease.  He also denies having any spontaneous infections related to his underlying leukopenia.  Of note, he is now being followed closely by Hepatology at [facility]. ? ? ?PHYSICAL EXAM:  ?There were no vitals taken for this visit. ?Wt Readings from Last 3 Encounters:  ?09/05/18 213 lb (96.6 kg)  ?09/04/18 213 lb (96.6 kg)  ?06/24/18 216 lb (98 kg)  ? ?There is no height or weight on file to calculate BMI. ?Performance status (ECOG): {CHL ONC [id number] ?Physical Exam ? ?LABS:  ? ? ?  Latest Ref Rng & Units 11/21/2018  ? 12:00 AM 06/24/2018  ?  2:45 PM 02/04/2018  ?  2:31 PM  ?CBC  ?WBC  4.0      3.8   4.1    ?Hemoglobin 13.5 - 17.5 15.8      17.0   16.8    ?Hematocrit 41 - 53 47      51.3   48.4    ?Platelets 150 - 399 58      62   55    ?  ? This result is from an external source.  ? ? ?  Latest Ref Rng & Units 11/21/2018  ? 12:00 AM 06/24/2018  ?  2:45 PM 02/04/2018  ?  2:31 PM  ?CMP  ?Glucose 65 - 99 mg/dL  87   87    ?BUN 8 - 27 mg/dL  14   20    ?Creatinine 0.6 - 1.3 1.0      1.06   1.19    ?Sodium 137 - 147 138      142   138    ?Potassium 3.4 - 5.3 4.6      3.7   4.8    ?Chloride 96 - 106 mmol/L  105   106    ?CO2 20 - 29 mmol/L  19   21    ?Calcium 8.6 - 10.2 mg/dL  9.2   9.1    ?Total Protein 6.0 - 8.5 g/dL  6.9   6.6    ?Total Bilirubin 0.0 - 1.2 mg/dL  1.5   0.9    ?Alkaline Phos 25 - 125 76      104   83    ?AST 14 - 40 34      28   26    ?ALT 10 - 40 39      21   26    ?  ? This  result is from an external source.  ? ? ? ?No results found for: CEA1 / No results found for: CEA1 ?Lab Results  ?Component Value Date  ? PSA1 0.4 12/24/2017  ? ?No results found for: MCR754 ?No results found for: HKG677  ?No results found for: TOTALPROTELP, ALBUMINELP, A1GS, A2GS, BETS, BETA2SER, GAMS, MSPIKE, SPEI ?Lab Results  ?Component Value Date  ? TIBC 331 12/24/2017  ? TIBC 425 04/14/2015  ? TIBC 452 03/17/2015  ? FERRITIN 85 12/24/2017  ?  FERRITIN 59 06/06/2017  ? FERRITIN 58 06/06/2017  ? IRONPCTSAT 33 12/24/2017  ? IRONPCTSAT 9 (L) 04/14/2015  ? IRONPCTSAT 8.4 03/17/2015  ? ?No results found for: LDH ? ?   ?Component Value Date/Time  ? PSA1 0.4 12/24/2017 1108  ? ? ?Review Flowsheet   ? ?  ?  Latest Ref Rng & Units 03/14/2017 06/06/2017 12/24/2017  ?Oncology Labs  ?Ferritin 30 - 400 ng/mL 23   59    ? 58   85    ?%SAT 15 - 55 %   33    ?Prostate Specific Ag, Serum 0.0 - 4.0 ng/mL   0.4    ?  ? ? Multiple values from one day are sorted in reverse-chronological order  ?  ?  ? ? ? ?STUDIES:  ?No results found.  ? ? ?ASSESSMENT & PLAN:  ? ?Assessment/Plan:  A 66 y.o. male with *** .The patient understands all the plans discussed today and is in agreement with them.   ? ? ? ?Liara Holm Kirby Funk, MD   ? ? ?  ?

## 2021-10-28 ENCOUNTER — Encounter: Payer: Self-pay | Admitting: Oncology

## 2021-10-28 ENCOUNTER — Other Ambulatory Visit: Payer: 59

## 2021-11-07 ENCOUNTER — Other Ambulatory Visit: Payer: Self-pay | Admitting: Oncology

## 2021-11-07 DIAGNOSIS — D696 Thrombocytopenia, unspecified: Secondary | ICD-10-CM

## 2021-11-07 NOTE — Progress Notes (Signed)
?[facility]  ?[address] ?[place],  [place]  [address] ?[phone] [id number] ? ?Clinic Day:  11/08/2021 ? ?Referring physician: Shawnee Knapp, MD ? ? ?HISTORY OF PRESENT ILLNESS:  ?The patient is a 66 y.o. 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  All of his blood counts, his platelet count has been the lowest in the past.  However, his platelets have never been severely low to where the risk of spontaneous bruising/bleeding has been high.  He comes in today for routine follow up.  Since his last visit, the patient has been doing fairly well.  He denies any subcutaneous bleeding/bruising issues which concern him for complications related to his underlying liver disease.  He also denies having any spontaneous infections related to his underlying leukopenia.  Of note, he is now being followed intermittently by Hepatology at [facility]. ? ?PHYSICAL EXAM:  ?Blood pressure (!) 153/84, pulse 66, temperature 98.3 ?F (36.8 ?C), resp. rate 16, height 6\' 5"  (1.956 m), weight 217 lb 14.4 oz (98.8 kg), SpO2 98 %. ?Wt Readings from Last 3 Encounters:  ?11/08/21 217 lb 14.4 oz (98.8 kg)  ?09/05/18 213 lb (96.6 kg)  ?09/04/18 213 lb (96.6 kg)  ? ?Body mass index is 25.84 kg/m?Marland Kitchen ?Performance status (ECOG): 1 - Symptomatic but completely ambulatory ?Physical Exam ?Constitutional:   ?   Appearance: Normal appearance. He is not ill-appearing.  ?HENT:  ?   Mouth/Throat:  ?   Mouth: Mucous membranes are moist.  ?   Pharynx: Oropharynx is clear. No oropharyngeal exudate or posterior oropharyngeal erythema.  ?Cardiovascular:  ?   Rate and Rhythm: Normal rate and regular rhythm.  ?   Heart sounds: No murmur heard. ?  No friction rub. No gallop.  ?Pulmonary:  ?   Effort: Pulmonary effort is normal. No respiratory distress.  ?   Breath sounds: Normal breath sounds. No wheezing, rhonchi or rales.  ?Abdominal:  ?   General: Bowel sounds are normal. There is no distension.  ?    Palpations: Abdomen is soft. There is no mass.  ?   Tenderness: There is no abdominal tenderness.  ?Musculoskeletal:     ?   General: No swelling.  ?   Right lower leg: No edema.  ?   Left lower leg: No edema.  ?Lymphadenopathy:  ?   Cervical: No cervical adenopathy.  ?   Upper Body:  ?   Right upper body: No supraclavicular or axillary adenopathy.  ?   Left upper body: No supraclavicular or axillary adenopathy.  ?   Lower Body: No right inguinal adenopathy. No left inguinal adenopathy.  ?Skin: ?   General: Skin is warm.  ?   Coloration: Skin is not jaundiced.  ?   Findings: No lesion or rash.  ?Neurological:  ?   General: No focal deficit present.  ?   Mental Status: He is alert and oriented to person, place, and time. Mental status is at baseline.  ?Psychiatric:     ?   Mood and Affect: Mood normal.     ?   Behavior: Behavior normal.     ?   Thought Content: Thought content normal.  ? ? ?LABS:  ? ? ?  Latest Ref Rng & Units 11/08/2021  ? 12:00 AM 11/21/2018  ? 12:00 AM 06/24/2018  ?  2:45 PM  ?CBC  ?WBC  4.1      4.0      3.8    ?  Hemoglobin 13.5 - 17.5 14.8      15.8      17.0    ?Hematocrit 41 - 53 45      47      51.3    ?Platelets 150 - 400 K/uL 62      58      62    ?  ? This result is from an external source.  ? ? ?  Latest Ref Rng & Units 11/08/2021  ? 12:00 AM 11/21/2018  ? 12:00 AM 06/24/2018  ?  2:45 PM  ?CMP  ?Glucose 65 - 99 mg/dL   87    ?BUN 4 - 21 16       14     ?Creatinine 0.6 - 1.3 0.9      1.0      1.06    ?Sodium 137 - 147 139      138      142    ?Potassium 3.5 - 5.1 mEq/L 3.9      4.6      3.7    ?Chloride 99 - 108 108       105    ?CO2 13 - 22 24       19     ?Calcium 8.7 - 10.7 9.0       9.2    ?Total Protein 6.0 - 8.5 g/dL   6.9    ?Total Bilirubin 0.0 - 1.2 mg/dL   1.5    ?Alkaline Phos 25 - 125 105      76      104    ?AST 14 - 40 31      34      28    ?ALT 10 - 40 U/L 26      39      21    ?  ? This result is from an external source.  ? ? ?ASSESSMENT & PLAN:  ?Assessment/Plan:  A 66 y.o. male  with a chronic history of pancytopenia related to his underlying cirrhosis and secondary splenomegaly.  When evaluating his labs today, I am pleased as his thrombocytopenia is stable.  Furthermore his white cells and hemoglobin remain normal.  Overall, I do not get the sense that any significant hematologic changes have occurred over these past few years.  As he is hematologically stable, I will see him back in 1 year for repeat clinical assessment.  The patient understands all the plans discussed today and is in agreement with them.   ? ? ? ?Ronald Lukacs Macarthur Critchley, MD   ? ? ?  ?

## 2021-11-08 ENCOUNTER — Inpatient Hospital Stay (INDEPENDENT_AMBULATORY_CARE_PROVIDER_SITE_OTHER): Payer: Medicare HMO | Admitting: Oncology

## 2021-11-08 ENCOUNTER — Inpatient Hospital Stay: Payer: Medicare HMO | Attending: Oncology

## 2021-11-08 ENCOUNTER — Telehealth: Payer: Self-pay | Admitting: Oncology

## 2021-11-08 VITALS — BP 153/84 | HR 66 | Temp 98.3°F | Resp 16 | Ht 77.0 in | Wt 217.9 lb

## 2021-11-08 DIAGNOSIS — D696 Thrombocytopenia, unspecified: Secondary | ICD-10-CM

## 2021-11-08 LAB — CBC AND DIFFERENTIAL
HCT: 45 (ref 41–53)
Hemoglobin: 14.8 (ref 13.5–17.5)
Neutrophils Absolute: 2.75
Platelets: 62 10*3/uL — AB (ref 150–400)
WBC: 4.1

## 2021-11-08 LAB — COMPREHENSIVE METABOLIC PANEL
Albumin: 4.1 (ref 3.5–5.0)
Calcium: 9 (ref 8.7–10.7)

## 2021-11-08 LAB — HEPATIC FUNCTION PANEL
ALT: 26 U/L (ref 10–40)
AST: 31 (ref 14–40)
Alkaline Phosphatase: 105 (ref 25–125)
Bilirubin, Total: 2.2

## 2021-11-08 LAB — BASIC METABOLIC PANEL
BUN: 16 (ref 4–21)
CO2: 24 — AB (ref 13–22)
Chloride: 108 (ref 99–108)
Creatinine: 0.9 (ref 0.6–1.3)
Glucose: 90
Potassium: 3.9 mEq/L (ref 3.5–5.1)
Sodium: 139 (ref 137–147)

## 2021-11-08 LAB — CBC: RBC: 4.98 (ref 3.87–5.11)

## 2021-11-08 NOTE — Telephone Encounter (Signed)
Per 11/08/21 next appt scheduled and confirmed with patient ?

## 2022-11-08 NOTE — Progress Notes (Unsigned)
[facility]  [address] [place],  [place]  [address] [phone]  Clinic Day:  11/09/2022  Referring physician: Garnetta Buddy I, NP   HISTORY OF PRESENT ILLNESS:  The patient is a 67 y.o. 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  Out of all of his blood counts, his platelet count has been the lowest in the past.  However, his platelets have never been severely low to where the risk of spontaneous bruising/bleeding has been high.  He comes in today for routine follow up.  Since his last visit, the patient has been doing fairly well.  He denies any subcutaneous bleeding/bruising issues which concern him for complications related to his underlying liver disease.  He also denies having any spontaneous infections related to his underlying leukopenia.  Of note, he is now being followed intermittently by Hepatology at [facility].  PHYSICAL EXAM:  Blood pressure (!) 156/81, pulse 68, temperature 97.9 F (36.6 C), resp. rate 18, height 6\' 5"  (1.956 m), weight 213 lb 12.8 oz (97 kg), SpO2 98 %. Wt Readings from Last 3 Encounters:  11/09/22 213 lb 12.8 oz (97 kg)  11/08/21 217 lb 14.4 oz (98.8 kg)  09/05/18 213 lb (96.6 kg)   Body mass index is 25.35 kg/m. Performance status (ECOG): 1 - Symptomatic but completely ambulatory Physical Exam Constitutional:      Appearance: Normal appearance. He is not ill-appearing.  HENT:     Mouth/Throat:     Mouth: Mucous membranes are moist.     Pharynx: Oropharynx is clear. No oropharyngeal exudate or posterior oropharyngeal erythema.  Cardiovascular:     Rate and Rhythm: Normal rate and regular rhythm.     Heart sounds: No murmur heard.    No friction rub. No gallop.  Pulmonary:     Effort: Pulmonary effort is normal. No respiratory distress.     Breath sounds: Normal breath sounds. No wheezing, rhonchi or rales.  Abdominal:     General: Bowel sounds are normal. There is no distension.      Palpations: Abdomen is soft. There is no mass.     Tenderness: There is no abdominal tenderness.  Musculoskeletal:        General: No swelling.     Right lower leg: No edema.     Left lower leg: No edema.  Lymphadenopathy:     Cervical: No cervical adenopathy.     Upper Body:     Right upper body: No supraclavicular or axillary adenopathy.     Left upper body: No supraclavicular or axillary adenopathy.     Lower Body: No right inguinal adenopathy. No left inguinal adenopathy.  Skin:    General: Skin is warm.     Coloration: Skin is not jaundiced.     Findings: No lesion or rash.  Neurological:     General: No focal deficit present.     Mental Status: He is alert and oriented to person, place, and time. Mental status is at baseline.  Psychiatric:        Mood and Affect: Mood normal.        Behavior: Behavior normal.        Thought Content: Thought content normal.     LABS:      Latest Ref Rng & Units 11/09/2022   12:00 AM 11/08/2021   12:00 AM 11/21/2018   12:00 AM  CBC  WBC  3.4     4.1     4.0  Hemoglobin 13.5 - 17.5 15.0     14.8     15.8      Hematocrit 41 - 53 45     45     47      Platelets 150 - 400 K/uL 69     62     58         This result is from an external source.      Latest Ref Rng & Units 11/09/2022   10:57 AM 11/08/2021   12:00 AM 11/21/2018   12:00 AM  CMP  Glucose 70 - 99 mg/dL 103     BUN 8 - 23 mg/dL 15  16       Creatinine 0.61 - 1.24 mg/dL 0.99  0.9     1.0      Sodium 135 - 145 mmol/L 140  139     138      Potassium 3.5 - 5.1 mmol/L 3.6  3.9     4.6      Chloride 98 - 111 mmol/L 110  108       CO2 22 - 32 mmol/L 21  24       Calcium 8.9 - 10.3 mg/dL 9.1  9.0       Total Protein 6.5 - 8.1 g/dL 7.4     Total Bilirubin 0.3 - 1.2 mg/dL 1.5     Alkaline Phos 38 - 126 U/L 92  105     76      AST 15 - 41 U/L 20  31     34      ALT 0 - 44 U/L 22  26     39         This result is from an external source.    ASSESSMENT & PLAN:   Assessment/Plan:  A 67 y.o. male with a chronic history of pancytopenia related to his underlying cirrhosis and secondary splenomegaly.  When evaluating his labs today, I am pleased as his thrombocytopenia is stable.  His white cells are slightly low, but he still has more than enough to fight off spontaneous infections.  His hemoglobin remain normal.  Overall, I do not get the sense that any significant hematologic changes have occurred over the past year.  As he is hematologically stable, I will see him back in 1 year for repeat clinical assessment.  The patient understands all the plans discussed today and is in agreement with them.    Yizel Canby Macarthur Critchley, MD

## 2022-11-09 ENCOUNTER — Other Ambulatory Visit: Payer: Self-pay | Admitting: Oncology

## 2022-11-09 ENCOUNTER — Inpatient Hospital Stay: Payer: Medicare HMO

## 2022-11-09 ENCOUNTER — Inpatient Hospital Stay: Payer: Medicare HMO | Attending: Oncology | Admitting: Oncology

## 2022-11-09 VITALS — BP 156/81 | HR 68 | Temp 97.9°F | Resp 18 | Ht 77.0 in | Wt 213.8 lb

## 2022-11-09 DIAGNOSIS — K7460 Unspecified cirrhosis of liver: Secondary | ICD-10-CM | POA: Insufficient documentation

## 2022-11-09 DIAGNOSIS — D61818 Other pancytopenia: Secondary | ICD-10-CM | POA: Diagnosis present

## 2022-11-09 DIAGNOSIS — D696 Thrombocytopenia, unspecified: Secondary | ICD-10-CM

## 2022-11-09 DIAGNOSIS — R161 Splenomegaly, not elsewhere classified: Secondary | ICD-10-CM | POA: Insufficient documentation

## 2022-11-09 LAB — CMP (CANCER CENTER ONLY)
ALT: 22 U/L (ref 0–44)
AST: 20 U/L (ref 15–41)
Albumin: 4.3 g/dL (ref 3.5–5.0)
Alkaline Phosphatase: 92 U/L (ref 38–126)
Anion gap: 9 (ref 5–15)
BUN: 15 mg/dL (ref 8–23)
CO2: 21 mmol/L — ABNORMAL LOW (ref 22–32)
Calcium: 9.1 mg/dL (ref 8.9–10.3)
Chloride: 110 mmol/L (ref 98–111)
Creatinine: 0.99 mg/dL (ref 0.61–1.24)
GFR, Estimated: >60 mL/min (ref >60)
Glucose, Bld: 103 mg/dL — ABNORMAL HIGH (ref 70–99)
Potassium: 3.6 mmol/L (ref 3.5–5.1)
Sodium: 140 mmol/L (ref 135–145)
Total Bilirubin: 1.5 mg/dL — ABNORMAL HIGH (ref 0.3–1.2)
Total Protein: 7.4 g/dL (ref 6.5–8.1)

## 2022-11-09 LAB — CBC AND DIFFERENTIAL
HCT: 45 (ref 41–53)
Hemoglobin: 15 (ref 13.5–17.5)
Neutrophils Absolute: 2.18
Platelets: 69 10*3/uL — AB (ref 150–400)
WBC: 3.4

## 2022-11-09 LAB — CBC: RBC: 4.92 (ref 3.87–5.11)

## 2023-11-08 ENCOUNTER — Other Ambulatory Visit: Payer: Self-pay

## 2023-11-08 DIAGNOSIS — D696 Thrombocytopenia, unspecified: Secondary | ICD-10-CM

## 2023-11-08 NOTE — Progress Notes (Unsigned)
 [facility]  [address] [place],  [place]  [address] [phone]  Clinic Day:  11/09/2023  Referring physician: Alveria Apley, NP   HISTORY OF PRESENT ILLNESS:  The patient is a 68 y.o. 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  Out of all of his blood counts, his platelet count has been the lowest in the past.  However, his platelets have never been severely low to where the risk of spontaneous bruising/bleeding has been high.  He comes in today for routine follow up.  Since his last visit, the patient has been doing fairly well.  He denies any subcutaneous bleeding/bruising issues which concern him for complications related to his underlying liver disease.  He also denies having any spontaneous infections related to his underlying leukopenia.  Of note, with respect to his cirrhosis, the patient did undergo an abdominal ultrasound in December 2024, which showed stable cirrhosis and splenomegaly.  There was no evidence of a hepatic mass being present.  PHYSICAL EXAM:  Blood pressure 117/69, pulse (!) 55, temperature 98 F (36.7 C), temperature source Oral, resp. rate 16, height 6\' 4"  (1.93 m), weight 211 lb 8 oz (95.9 kg), SpO2 98%. Wt Readings from Last 3 Encounters:  11/09/23 211 lb 8 oz (95.9 kg)  11/09/22 213 lb 12.8 oz (97 kg)  11/08/21 217 lb 14.4 oz (98.8 kg)   Body mass index is 25.74 kg/m. Performance status (ECOG): 1 - Symptomatic but completely ambulatory Physical Exam Constitutional:      Appearance: Normal appearance. He is not ill-appearing.  HENT:     Mouth/Throat:     Mouth: Mucous membranes are moist.     Pharynx: Oropharynx is clear. No oropharyngeal exudate or posterior oropharyngeal erythema.  Cardiovascular:     Rate and Rhythm: Normal rate and regular rhythm.     Heart sounds: No murmur heard.    No friction rub. No gallop.  Pulmonary:     Effort: Pulmonary effort is normal. No respiratory  distress.     Breath sounds: Normal breath sounds. No wheezing, rhonchi or rales.  Abdominal:     General: Bowel sounds are normal. There is no distension.     Palpations: Abdomen is soft. There is no mass.     Tenderness: There is no abdominal tenderness.  Musculoskeletal:        General: No swelling.     Right lower leg: No edema.     Left lower leg: No edema.  Lymphadenopathy:     Cervical: No cervical adenopathy.     Upper Body:     Right upper body: No supraclavicular or axillary adenopathy.     Left upper body: No supraclavicular or axillary adenopathy.     Lower Body: No right inguinal adenopathy. No left inguinal adenopathy.  Skin:    General: Skin is warm.     Coloration: Skin is not jaundiced.     Findings: No lesion or rash.  Neurological:     General: No focal deficit present.     Mental Status: He is alert and oriented to person, place, and time. Mental status is at baseline.  Psychiatric:        Mood and Affect: Mood normal.        Behavior: Behavior normal.        Thought Content: Thought content normal.    LABS:      Latest Ref Rng & Units 11/09/2023   10:09 AM 11/09/2022  12:00 AM 11/08/2021   12:00 AM  CBC  WBC 4.0 - 10.5 K/uL 3.6  3.4     4.1      Hemoglobin 13.0 - 17.0 g/dL 98.1  19.1     47.8      Hematocrit 39.0 - 52.0 % 42.9  45     45      Platelets 150 - 400 K/uL 61  69     62         This result is from an external source.      Latest Ref Rng & Units 11/09/2022   10:57 AM 11/08/2021   12:00 AM 11/21/2018   12:00 AM  CMP  Glucose 70 - 99 mg/dL 295     BUN 8 - 23 mg/dL 15  16       Creatinine 0.61 - 1.24 mg/dL 6.21  0.9     1.0      Sodium 135 - 145 mmol/L 140  139     138      Potassium 3.5 - 5.1 mmol/L 3.6  3.9     4.6      Chloride 98 - 111 mmol/L 110  108       CO2 22 - 32 mmol/L 21  24       Calcium 8.9 - 10.3 mg/dL 9.1  9.0       Total Protein 6.5 - 8.1 g/dL 7.4     Total Bilirubin 0.3 - 1.2 mg/dL 1.5     Alkaline Phos 38 - 126 U/L 92   105     76      AST 15 - 41 U/L 20  31     34      ALT 0 - 44 U/L 22  26     39         This result is from an external source.    ASSESSMENT & PLAN:  Assessment/Plan:  A 68 y.o. male with a chronic history of pancytopenia related to his underlying cirrhosis and secondary splenomegaly.  When evaluating his labs today, I am pleased as his thrombocytopenia is stable.  His white cells are slightly low, but he still has more than enough to fight off spontaneous infections.  His hemoglobin remain normal.  Overall, I do not get the sense that any significant hematologic changes have occurred over the past year.  As he is hematologically stable, I will see him back in 1 year for repeat clinical assessment.  The patient understands all the plans discussed today and is in agreement with them.    Behr Cislo Kirby Funk, MD

## 2023-11-09 ENCOUNTER — Inpatient Hospital Stay: Payer: Medicare HMO

## 2023-11-09 ENCOUNTER — Telehealth: Payer: Self-pay | Admitting: Oncology

## 2023-11-09 ENCOUNTER — Inpatient Hospital Stay: Payer: Medicare HMO | Attending: Oncology | Admitting: Oncology

## 2023-11-09 ENCOUNTER — Other Ambulatory Visit: Payer: Self-pay | Admitting: Oncology

## 2023-11-09 VITALS — BP 117/69 | HR 55 | Temp 98.0°F | Resp 16 | Ht 76.0 in | Wt 211.5 lb

## 2023-11-09 DIAGNOSIS — D61818 Other pancytopenia: Secondary | ICD-10-CM | POA: Insufficient documentation

## 2023-11-09 DIAGNOSIS — K7460 Unspecified cirrhosis of liver: Secondary | ICD-10-CM | POA: Insufficient documentation

## 2023-11-09 DIAGNOSIS — D759 Disease of blood and blood-forming organs, unspecified: Secondary | ICD-10-CM | POA: Diagnosis not present

## 2023-11-09 DIAGNOSIS — D696 Thrombocytopenia, unspecified: Secondary | ICD-10-CM

## 2023-11-09 LAB — CBC WITH DIFFERENTIAL (CANCER CENTER ONLY)
Abs Immature Granulocytes: 0 10*3/uL (ref 0.00–0.07)
Basophils Absolute: 0 10*3/uL (ref 0.0–0.1)
Basophils Relative: 0 %
Eosinophils Absolute: 0.1 10*3/uL (ref 0.0–0.5)
Eosinophils Relative: 3 %
HCT: 42.9 % (ref 39.0–52.0)
Hemoglobin: 14.4 g/dL (ref 13.0–17.0)
Immature Granulocytes: 0 %
Lymphocytes Relative: 22 %
Lymphs Abs: 0.8 10*3/uL (ref 0.7–4.0)
MCH: 32.1 pg (ref 26.0–34.0)
MCHC: 33.6 g/dL (ref 30.0–36.0)
MCV: 95.8 fL (ref 80.0–100.0)
Monocytes Absolute: 0.3 10*3/uL (ref 0.1–1.0)
Monocytes Relative: 9 %
Neutro Abs: 2.4 10*3/uL (ref 1.7–7.7)
Neutrophils Relative %: 66 %
Platelet Count: 61 10*3/uL — ABNORMAL LOW (ref 150–400)
RBC: 4.48 MIL/uL (ref 4.22–5.81)
RDW: 13.4 % (ref 11.5–15.5)
Smear Review: NORMAL
WBC Count: 3.6 10*3/uL — ABNORMAL LOW (ref 4.0–10.5)
nRBC: 0 % (ref 0.0–0.2)
nRBC: 0 /100{WBCs}

## 2023-11-09 NOTE — Telephone Encounter (Signed)
 Patient has been scheduled for follow-up visit per 11/09/23 LOS.  Pt given an appt calendar with date and time.

## 2024-08-26 ENCOUNTER — Ambulatory Visit: Payer: Medicare (Managed Care) | Admitting: Cardiology

## 2024-09-15 ENCOUNTER — Ambulatory Visit: Payer: Medicare (Managed Care) | Admitting: Cardiology

## 2024-10-31 ENCOUNTER — Ambulatory Visit: Payer: Medicare (Managed Care) | Admitting: Cardiology

## 2024-11-10 ENCOUNTER — Ambulatory Visit: Admitting: Oncology

## 2024-11-10 ENCOUNTER — Other Ambulatory Visit
